# Patient Record
Sex: Female | Born: 1955 | Race: White | Hispanic: No | Marital: Married | Smoking: Never smoker
Health system: Southern US, Community
[De-identification: ages and names within clinical notes are randomized; demographics above are authoritative.]

## PROBLEM LIST (undated history)

## (undated) DIAGNOSIS — T8859XA Other complications of anesthesia, initial encounter: Secondary | ICD-10-CM

## (undated) DIAGNOSIS — Z8739 Personal history of other diseases of the musculoskeletal system and connective tissue: Secondary | ICD-10-CM

## (undated) DIAGNOSIS — Z9889 Other specified postprocedural states: Secondary | ICD-10-CM

## (undated) DIAGNOSIS — R112 Nausea with vomiting, unspecified: Secondary | ICD-10-CM

## (undated) DIAGNOSIS — T7840XA Allergy, unspecified, initial encounter: Secondary | ICD-10-CM

## (undated) DIAGNOSIS — M1990 Unspecified osteoarthritis, unspecified site: Secondary | ICD-10-CM

## (undated) DIAGNOSIS — T4145XA Adverse effect of unspecified anesthetic, initial encounter: Secondary | ICD-10-CM

## (undated) HISTORY — PX: CERVICAL DISCECTOMY: SHX98

## (undated) HISTORY — PX: JOINT REPLACEMENT: SHX530

## (undated) HISTORY — DX: Personal history of other diseases of the musculoskeletal system and connective tissue: Z87.39

## (undated) HISTORY — DX: Unspecified osteoarthritis, unspecified site: M19.90

## (undated) HISTORY — PX: EYE SURGERY: SHX253

## (undated) HISTORY — DX: Allergy, unspecified, initial encounter: T78.40XA

## (undated) HISTORY — PX: KNEE ARTHROSCOPY: SHX127

## (undated) HISTORY — PX: BUNIONECTOMY: SHX129

---

## 1997-09-02 ENCOUNTER — Other Ambulatory Visit: Admission: RE | Admit: 1997-09-02 | Discharge: 1997-09-02 | Payer: Self-pay | Admitting: Gynecology

## 1998-09-27 ENCOUNTER — Other Ambulatory Visit: Admission: RE | Admit: 1998-09-27 | Discharge: 1998-09-27 | Payer: Self-pay | Admitting: Gynecology

## 1998-10-27 ENCOUNTER — Encounter: Admission: RE | Admit: 1998-10-27 | Discharge: 1998-11-22 | Payer: Self-pay | Admitting: Internal Medicine

## 1999-07-26 ENCOUNTER — Encounter: Admission: RE | Admit: 1999-07-26 | Discharge: 1999-07-26 | Payer: Self-pay | Admitting: Diagnostic Radiology

## 1999-07-26 ENCOUNTER — Encounter: Payer: Self-pay | Admitting: Diagnostic Radiology

## 1999-10-21 ENCOUNTER — Other Ambulatory Visit: Admission: RE | Admit: 1999-10-21 | Discharge: 1999-10-21 | Payer: Self-pay | Admitting: Gynecology

## 1999-10-25 ENCOUNTER — Encounter: Admission: RE | Admit: 1999-10-25 | Discharge: 1999-10-25 | Payer: Self-pay | Admitting: Gynecology

## 1999-10-25 ENCOUNTER — Encounter: Payer: Self-pay | Admitting: Gynecology

## 2000-03-14 ENCOUNTER — Encounter: Admission: RE | Admit: 2000-03-14 | Discharge: 2000-03-14 | Payer: Self-pay | Admitting: Rheumatology

## 2000-03-14 ENCOUNTER — Encounter: Payer: Self-pay | Admitting: Rheumatology

## 2000-10-26 ENCOUNTER — Encounter: Payer: Self-pay | Admitting: Gynecology

## 2000-10-26 ENCOUNTER — Encounter: Admission: RE | Admit: 2000-10-26 | Discharge: 2000-10-26 | Payer: Self-pay | Admitting: Gynecology

## 2000-10-31 ENCOUNTER — Other Ambulatory Visit: Admission: RE | Admit: 2000-10-31 | Discharge: 2000-10-31 | Payer: Self-pay | Admitting: Gynecology

## 2001-03-20 ENCOUNTER — Other Ambulatory Visit: Admission: RE | Admit: 2001-03-20 | Discharge: 2001-03-20 | Payer: Self-pay | Admitting: Gynecology

## 2001-03-26 ENCOUNTER — Encounter: Admission: RE | Admit: 2001-03-26 | Discharge: 2001-04-04 | Payer: Self-pay | Admitting: Internal Medicine

## 2001-08-01 ENCOUNTER — Encounter: Admission: RE | Admit: 2001-08-01 | Discharge: 2001-08-01 | Payer: Self-pay | Admitting: Neurosurgery

## 2001-08-01 ENCOUNTER — Ambulatory Visit (HOSPITAL_COMMUNITY): Admission: RE | Admit: 2001-08-01 | Discharge: 2001-08-01 | Payer: Self-pay | Admitting: Neurosurgery

## 2001-08-01 ENCOUNTER — Encounter: Payer: Self-pay | Admitting: Neurosurgery

## 2001-09-05 ENCOUNTER — Encounter: Payer: Self-pay | Admitting: Neurosurgery

## 2001-09-05 ENCOUNTER — Inpatient Hospital Stay (HOSPITAL_COMMUNITY): Admission: RE | Admit: 2001-09-05 | Discharge: 2001-09-06 | Payer: Self-pay | Admitting: Neurosurgery

## 2001-10-28 ENCOUNTER — Encounter: Admission: RE | Admit: 2001-10-28 | Discharge: 2001-10-28 | Payer: Self-pay | Admitting: Gynecology

## 2001-10-28 ENCOUNTER — Encounter: Payer: Self-pay | Admitting: Gynecology

## 2001-10-29 ENCOUNTER — Other Ambulatory Visit: Admission: RE | Admit: 2001-10-29 | Discharge: 2001-10-29 | Payer: Self-pay | Admitting: Gynecology

## 2002-01-22 ENCOUNTER — Encounter: Admission: RE | Admit: 2002-01-22 | Discharge: 2002-02-12 | Payer: Self-pay | Admitting: Neurosurgery

## 2002-10-31 ENCOUNTER — Encounter: Payer: Self-pay | Admitting: Gynecology

## 2002-10-31 ENCOUNTER — Encounter: Admission: RE | Admit: 2002-10-31 | Discharge: 2002-10-31 | Payer: Self-pay | Admitting: Gynecology

## 2002-11-17 ENCOUNTER — Other Ambulatory Visit: Admission: RE | Admit: 2002-11-17 | Discharge: 2002-11-17 | Payer: Self-pay | Admitting: Gynecology

## 2003-11-09 ENCOUNTER — Encounter: Admission: RE | Admit: 2003-11-09 | Discharge: 2003-11-09 | Payer: Self-pay | Admitting: Gynecology

## 2003-11-18 ENCOUNTER — Other Ambulatory Visit: Admission: RE | Admit: 2003-11-18 | Discharge: 2003-11-18 | Payer: Self-pay | Admitting: Gynecology

## 2004-02-13 ENCOUNTER — Encounter: Admission: RE | Admit: 2004-02-13 | Discharge: 2004-02-13 | Payer: Self-pay | Admitting: Radiology

## 2004-11-15 ENCOUNTER — Encounter: Admission: RE | Admit: 2004-11-15 | Discharge: 2004-11-15 | Payer: Self-pay | Admitting: Gynecology

## 2004-11-29 ENCOUNTER — Emergency Department (HOSPITAL_COMMUNITY): Admission: EM | Admit: 2004-11-29 | Discharge: 2004-11-29 | Payer: Self-pay | Admitting: Emergency Medicine

## 2004-11-30 ENCOUNTER — Other Ambulatory Visit: Admission: RE | Admit: 2004-11-30 | Discharge: 2004-11-30 | Payer: Self-pay | Admitting: Gynecology

## 2005-05-30 ENCOUNTER — Ambulatory Visit (HOSPITAL_COMMUNITY): Admission: RE | Admit: 2005-05-30 | Discharge: 2005-05-30 | Payer: Self-pay | Admitting: Diagnostic Radiology

## 2005-05-30 ENCOUNTER — Encounter: Admission: RE | Admit: 2005-05-30 | Discharge: 2005-05-30 | Payer: Self-pay | Admitting: Internal Medicine

## 2005-06-01 ENCOUNTER — Ambulatory Visit (HOSPITAL_COMMUNITY): Admission: RE | Admit: 2005-06-01 | Discharge: 2005-06-01 | Payer: Self-pay | Admitting: Internal Medicine

## 2005-06-01 ENCOUNTER — Encounter: Admission: RE | Admit: 2005-06-01 | Discharge: 2005-06-01 | Payer: Self-pay | Admitting: Internal Medicine

## 2005-11-30 ENCOUNTER — Other Ambulatory Visit: Admission: RE | Admit: 2005-11-30 | Discharge: 2005-11-30 | Payer: Self-pay | Admitting: Gynecology

## 2005-12-13 ENCOUNTER — Encounter: Admission: RE | Admit: 2005-12-13 | Discharge: 2005-12-13 | Payer: Self-pay | Admitting: Gynecology

## 2005-12-15 ENCOUNTER — Encounter: Admission: RE | Admit: 2005-12-15 | Discharge: 2005-12-15 | Payer: Self-pay | Admitting: Gynecology

## 2005-12-24 ENCOUNTER — Encounter: Admission: RE | Admit: 2005-12-24 | Discharge: 2005-12-24 | Payer: Self-pay | Admitting: Internal Medicine

## 2006-12-03 ENCOUNTER — Other Ambulatory Visit: Admission: RE | Admit: 2006-12-03 | Discharge: 2006-12-03 | Payer: Self-pay | Admitting: Gynecology

## 2006-12-17 ENCOUNTER — Encounter: Admission: RE | Admit: 2006-12-17 | Discharge: 2006-12-17 | Payer: Self-pay | Admitting: Gynecology

## 2007-01-02 IMAGING — MG MM SCREEN MAMMOGRAM BILATERAL
4 series · 4 of 4 positions shown · non-contrast
Comparison: 11-09-03

DG SCREEN MAMMOGRAM BILATERAL
Bilateral CC and MLO view(s) were taken.

SCREENING MAMMOGRAM:

[R CC]
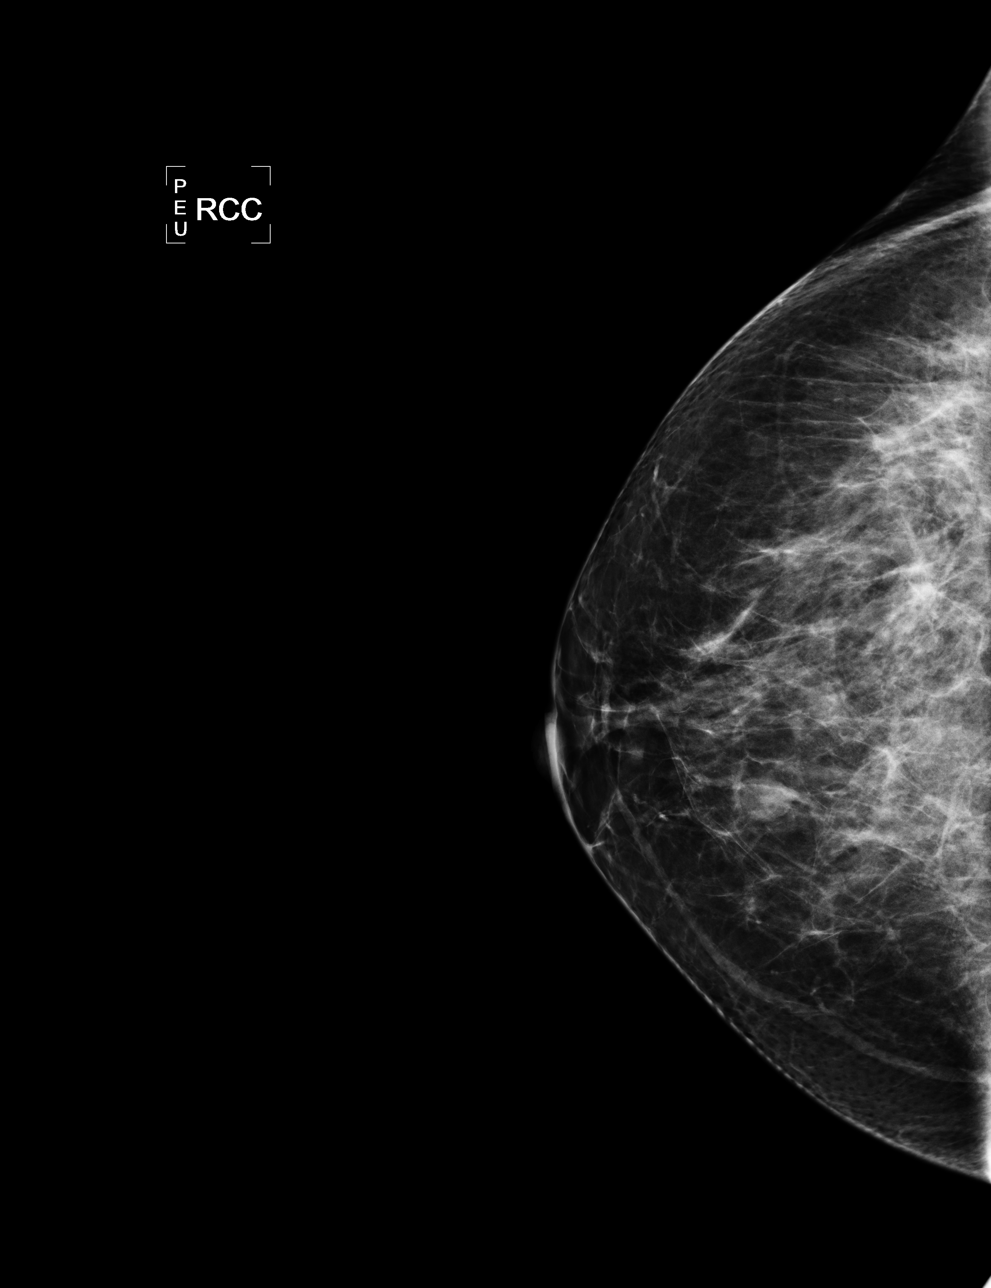

[L CC]
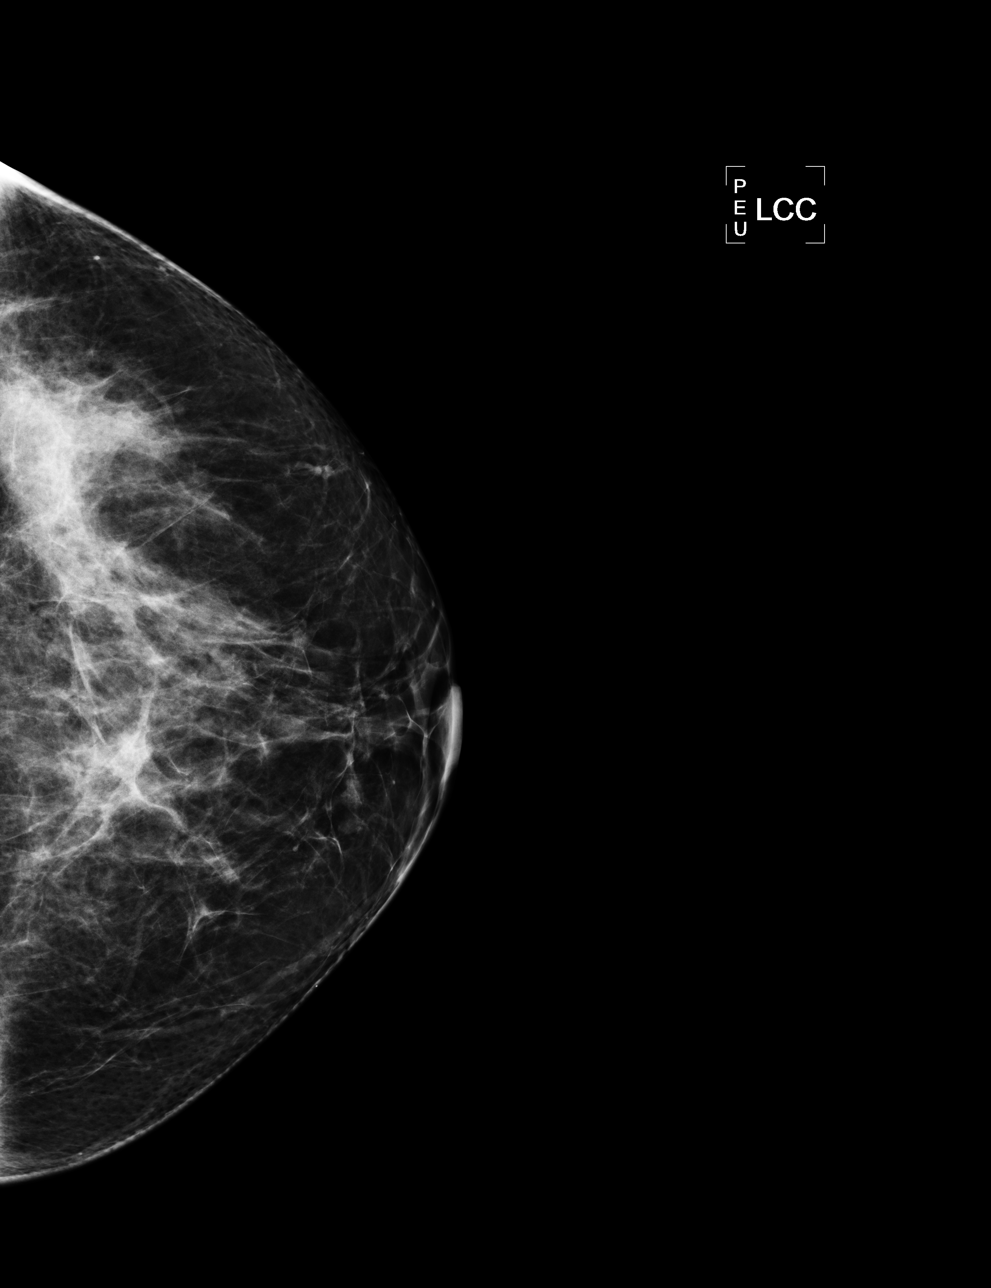

[L MLO]
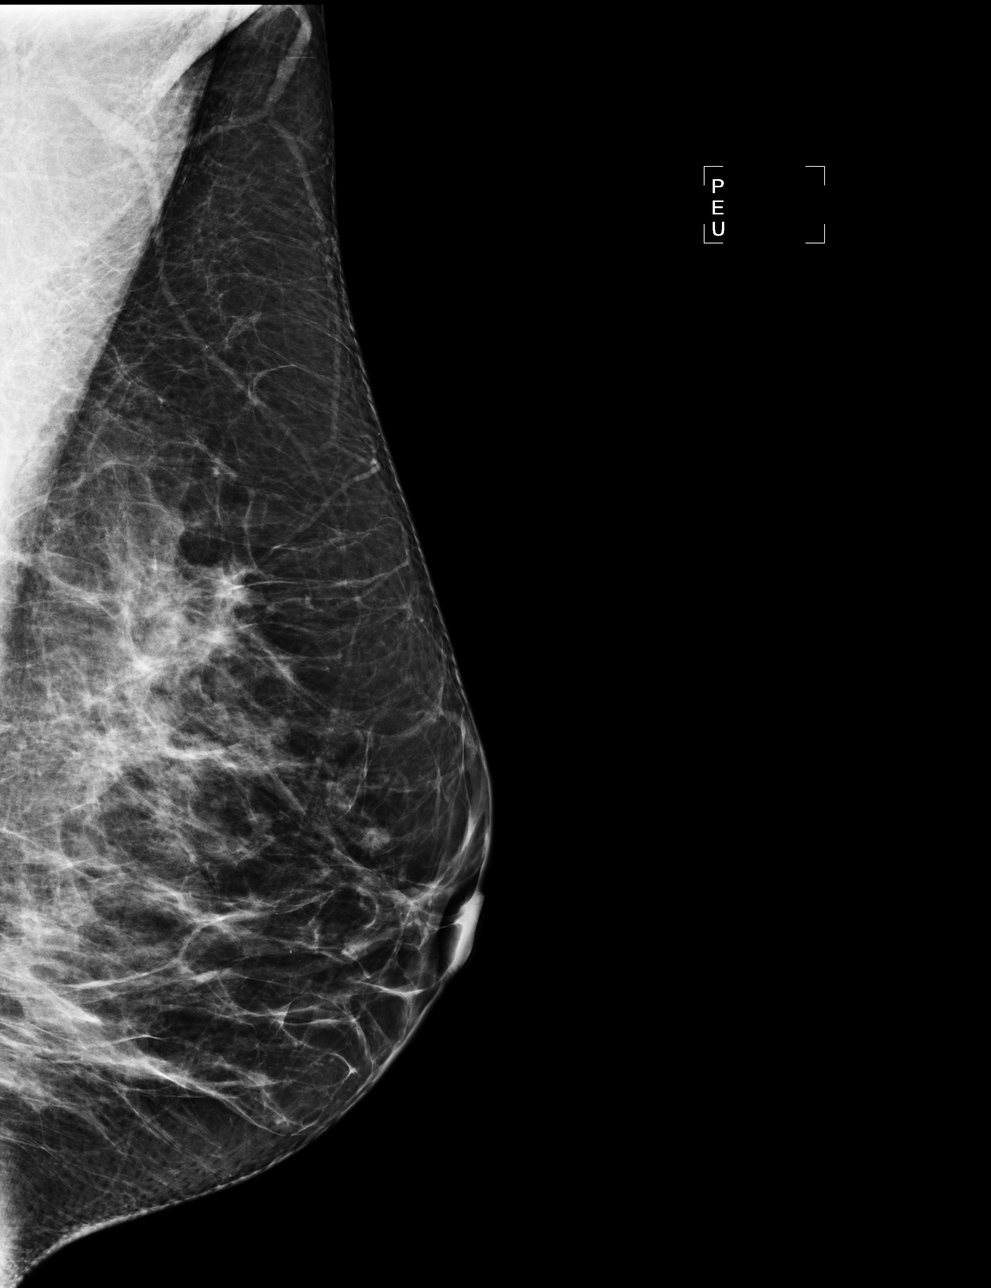

[R MLO]
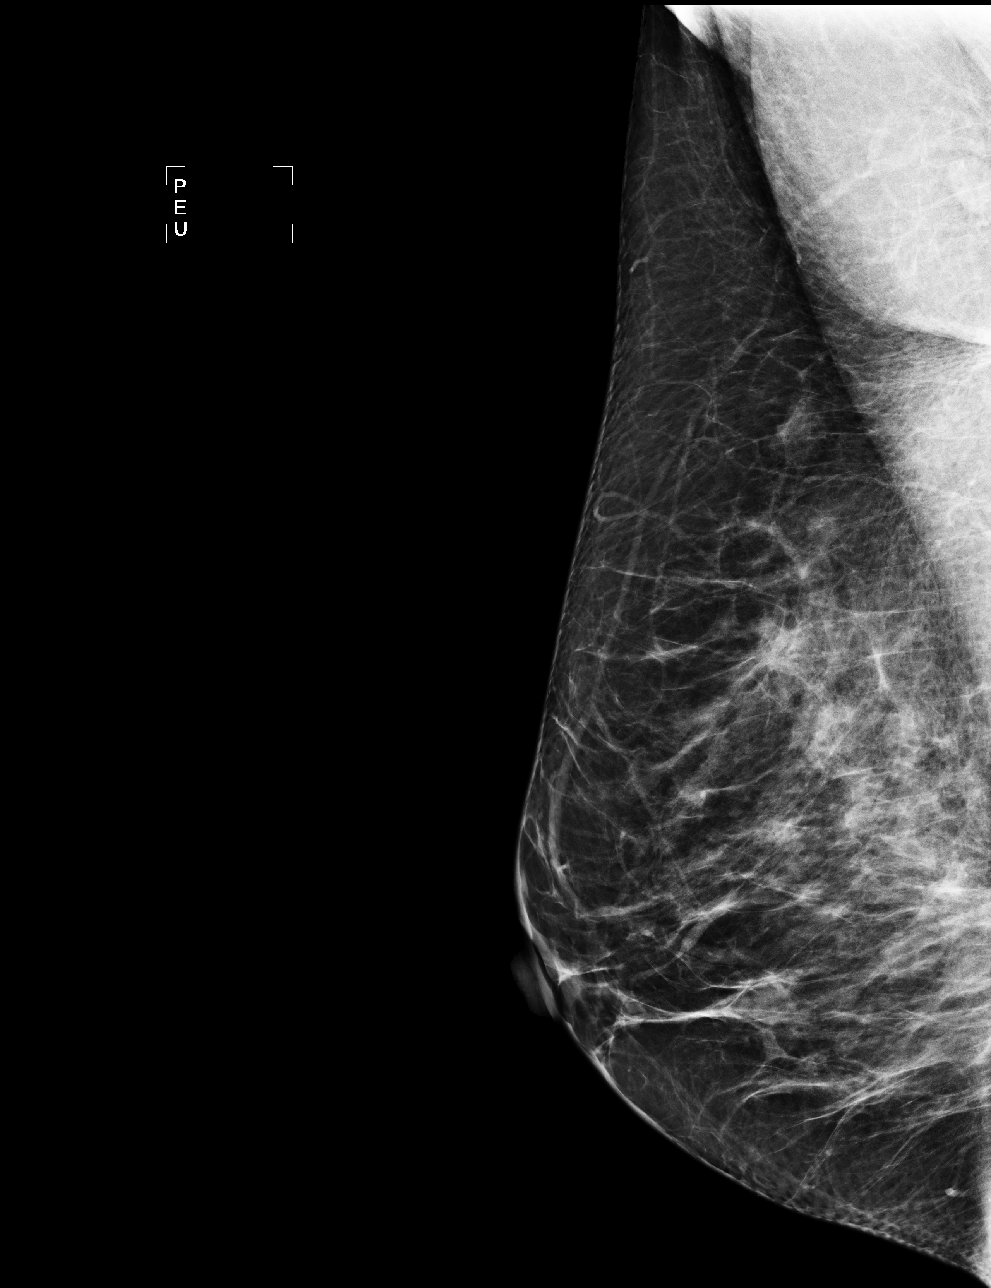

[4 of 4 positions shown; findings below may reference images not displayed]

The breast tissue is heterogeneously dense.  Possible distortion is noted in the left breast.  Spot
compression views and possibly sonography are recommended for further evaluation.  The right 
breast is unremarkable.
IMPRESSION: Possible distortion, left breast.  Additional evaluation is indicated.  The patient will be 
contacted for additional studies and a supplemental report will follow.

ASSESSMENT: Need additional imaging evaluation and/or prior mammograms for comparison - BI-RADS 0 -
Left

Further imaging of the left breast.
ANALYZED BY COMPUTER AIDED DETECTION. , THIS PROCEDURE WAS A DIGITAL MAMMOGRAM.

## 2007-12-09 ENCOUNTER — Other Ambulatory Visit: Admission: RE | Admit: 2007-12-09 | Discharge: 2007-12-09 | Payer: Self-pay | Admitting: Gynecology

## 2007-12-19 ENCOUNTER — Encounter: Admission: RE | Admit: 2007-12-19 | Discharge: 2007-12-19 | Payer: Self-pay | Admitting: Gynecology

## 2008-06-23 ENCOUNTER — Ambulatory Visit: Payer: Self-pay | Admitting: Internal Medicine

## 2008-08-02 ENCOUNTER — Emergency Department (HOSPITAL_COMMUNITY): Admission: EM | Admit: 2008-08-02 | Discharge: 2008-08-02 | Payer: Self-pay | Admitting: Emergency Medicine

## 2008-08-02 ENCOUNTER — Ambulatory Visit: Payer: Self-pay | Admitting: Internal Medicine

## 2008-12-21 ENCOUNTER — Encounter: Admission: RE | Admit: 2008-12-21 | Discharge: 2008-12-21 | Payer: Self-pay | Admitting: Gynecology

## 2008-12-23 ENCOUNTER — Encounter: Admission: RE | Admit: 2008-12-23 | Discharge: 2008-12-23 | Payer: Self-pay | Admitting: Gynecology

## 2009-10-14 ENCOUNTER — Ambulatory Visit: Payer: Self-pay | Admitting: Internal Medicine

## 2009-10-15 ENCOUNTER — Encounter: Admission: RE | Admit: 2009-10-15 | Discharge: 2009-10-15 | Payer: Self-pay | Admitting: Internal Medicine

## 2009-12-27 ENCOUNTER — Encounter: Admission: RE | Admit: 2009-12-27 | Discharge: 2009-12-27 | Payer: Self-pay | Admitting: Gynecology

## 2010-02-17 ENCOUNTER — Ambulatory Visit: Payer: Self-pay | Admitting: Internal Medicine

## 2010-06-25 ENCOUNTER — Encounter: Payer: Self-pay | Admitting: Diagnostic Radiology

## 2010-06-27 ENCOUNTER — Encounter: Payer: Self-pay | Admitting: Gynecology

## 2010-09-20 LAB — COMPREHENSIVE METABOLIC PANEL
ALT: 20 U/L (ref 0–35)
AST: 25 U/L (ref 0–37)
Alkaline Phosphatase: 54 U/L (ref 39–117)
CO2: 22 mEq/L (ref 19–32)
Calcium: 8.6 mg/dL (ref 8.4–10.5)
Chloride: 103 mEq/L (ref 96–112)
GFR calc Af Amer: >60 mL/min (ref >60)
GFR calc non Af Amer: >60 mL/min (ref >60)
Glucose, Bld: 97 mg/dL (ref 70–99)
Potassium: 3.7 mEq/L (ref 3.5–5.1)
Sodium: 134 mEq/L — ABNORMAL LOW (ref 135–145)
Total Bilirubin: 0.5 mg/dL (ref 0.3–1.2)

## 2010-09-20 LAB — CBC
Hemoglobin: 14.3 g/dL (ref 12.0–15.0)
MCHC: 34.6 g/dL (ref 30.0–36.0)
RBC: 4.46 MIL/uL (ref 3.87–5.11)
WBC: 5.9 10*3/uL (ref 4.0–10.5)

## 2010-09-20 LAB — DIFFERENTIAL
Basophils Absolute: 0.1 10*3/uL (ref 0.0–0.1)
Basophils Relative: 1 % (ref 0–1)
Eosinophils Absolute: 0 10*3/uL (ref 0.0–0.7)
Eosinophils Relative: 0 % (ref 0–5)
Lymphs Abs: 1.4 10*3/uL (ref 0.7–4.0)
Neutrophils Relative %: 69 % (ref 43–77)

## 2010-09-20 LAB — MAGNESIUM: Magnesium: 2.3 mg/dL (ref 1.5–2.5)

## 2010-09-28 ENCOUNTER — Other Ambulatory Visit: Payer: Self-pay | Admitting: Gynecology

## 2010-09-28 DIAGNOSIS — Z1231 Encounter for screening mammogram for malignant neoplasm of breast: Secondary | ICD-10-CM

## 2010-10-21 NOTE — Op Note (Signed)
[facility]  Patient:    [name], [name] Visit Number: [id number] MRN: [id number]          Service Type: SUR Location: [id number] Attending Physician:  Barton Fanny Dictated by:   Hewitt Shorts, M.D. Proc. 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  POSTOPERATIVE DIAGNOSIS:  C3-4 cervical spondylosis with associated dynamic instability and resulting neck pain and headache.  OPERATION PERFORMED:  C3-4 anterior cervical diskectomy and arthrodesis with iliac crest allograft and tether cervical plating.  SURGEON:  Hewitt Shorts, M.D.  ASSISTANT:  Payton Doughty, M.D.  ANESTHESIA:  General endotracheal.  INDICATIONS FOR PROCEDURE:  The patient is a 55 year old woman who presented with neck pain and headaches, who is found to have significant left C3-4 facet arthropathy and flexion and extension x-ray showed dynamic instability with proper alignment and extension and 3 to 4 mm anterolisthesis of C3 on 4 in flexion as well as about 2 to 3 mm of anterolisthesis of C3 on 4 in a neutral position.  Decision was made to proceed with single level arthrodesis.  DESCRIPTION OF PROCEDURE:  The patient was brought to the operating room and placed under general endotracheal anesthesia.  The patient was placed in 10 pounds of halter traction and the neck was prepped with Betadine soap and solution and draped in sterile fashion.  A horizontal incision was made on the left side of the neck.  The line of incision was infiltrated with local anesthetic with epinephrine.  Incision was made with a Shaw scalpel at a temperature of 120.  Dissection was carried down to the subcutaneous tissues and platysma.  Dissection was then carried out to an  avascular plane leaving the sternocleidomastoid, carotid artery and jugular vein laterally and trachea and esophagus medially.  The ventral aspect of the vertebral column was identified and localizing x-ray taken and the C3-4 intervertebral disk space was identified.  Diskectomy was begun with incision of the annulus and continued with microcurets and pituitary rongeurs.  The cartilaginous end plates of the corresponding vertebra were removed using microcurets and the Micromax drill.  The microscope was draped and brought into the field to provide additional magnification, illumination and visualization.  Posterior osteophytic changes were removed using the Micromax drill; however, the posterior longitudinal ligament was left intact to allow for ligament taxis with the arthrodesis.  Once the end plates were prepared, we selected a wedge of iliac crest allograft.  It was cut and shaped to size and positioned in the intervertebral disk space and countersunk.  We then selected a 14 mm tether cervical plate.  It was positioned over the fusion construct and secured to each of the vertebrae with a pair of 4.0 x 13 mm screws.  Each screw hole was drilled and tapped and a screw placed.  Final tightening was done of all four screws.  An x-ray showed the graft in good position, the plate and screws in good position, the overall alignment was good.  The cervical traction notably had been discontinued prior to plating.  Once the arthrodesis was completed, we irrigated the wound with bacitracin solution and checked for hemostasis which was established and confirmed and then  proceeded with closure.  The platysma was closed with interrupted inverted 2-0 undyed Vicryl sutures.  The subcutaneous and subcuticular layer were closed with interrupted inverted 3-0 undyed Vicryl sutures and the skin was reapproximated with Dermabond.  The patient tolerated the procedure well.  Estimated blood loss for this  procedure was 25 cc.  Sponge, needle and instrument counts were correct.  The patient was placed in a soft cervical collar, reversed from anesthetic, extubated and transferred to the recovery room for further care where she was noted to be moving all four extremities. Dictated by:   Hewitt Shorts, M.D. Attending Physician:  Barton Fanny DD:  09/05/01 TD:  09/05/01 Job: [id number]

## 2010-12-29 ENCOUNTER — Ambulatory Visit
Admission: RE | Admit: 2010-12-29 | Discharge: 2010-12-29 | Disposition: A | Discharge status: Home / Self Care | Payer: PRIVATE HEALTH INSURANCE | Source: Ambulatory Visit | Attending: Gynecology | Admitting: Gynecology

## 2010-12-29 DIAGNOSIS — Z1231 Encounter for screening mammogram for malignant neoplasm of breast: Secondary | ICD-10-CM

## 2011-01-18 ENCOUNTER — Encounter: Payer: Self-pay | Admitting: Internal Medicine

## 2011-01-20 ENCOUNTER — Other Ambulatory Visit: Payer: PRIVATE HEALTH INSURANCE | Admitting: Internal Medicine

## 2011-01-20 DIAGNOSIS — Z0000 Encounter for general adult medical examination without abnormal findings: Secondary | ICD-10-CM

## 2011-01-21 LAB — COMPREHENSIVE METABOLIC PANEL
Albumin: 4.1 g/dL (ref 3.5–5.2)
BUN: 16 mg/dL (ref 6–23)
CO2: 22 mEq/L (ref 19–32)
Glucose, Bld: 86 mg/dL (ref 70–99)
Potassium: 4 mEq/L (ref 3.5–5.3)
Sodium: 138 mEq/L (ref 135–145)
Total Bilirubin: 0.7 mg/dL (ref 0.3–1.2)
Total Protein: 6.5 g/dL (ref 6.0–8.3)

## 2011-01-21 LAB — CBC WITH DIFFERENTIAL/PLATELET
Eosinophils Absolute: 0.2 10*3/uL (ref 0.0–0.7)
HCT: 40.1 % (ref 36.0–46.0)
Hemoglobin: 12.8 g/dL (ref 12.0–15.0)
Lymphs Abs: 1.9 10*3/uL (ref 0.7–4.0)
MCH: 30.2 pg (ref 26.0–34.0)
Monocytes Absolute: 0.4 10*3/uL (ref 0.1–1.0)
Monocytes Relative: 10 % (ref 3–12)
Neutrophils Relative %: 31 % — ABNORMAL LOW (ref 43–77)
RBC: 4.24 MIL/uL (ref 3.87–5.11)

## 2011-01-21 LAB — LIPID PANEL
Cholesterol: 209 mg/dL — ABNORMAL HIGH (ref 0–200)
Triglycerides: 40 mg/dL (ref <150)

## 2011-01-22 ENCOUNTER — Encounter: Payer: Self-pay | Admitting: Internal Medicine

## 2011-01-23 ENCOUNTER — Ambulatory Visit (INDEPENDENT_AMBULATORY_CARE_PROVIDER_SITE_OTHER): Payer: PRIVATE HEALTH INSURANCE | Admitting: Internal Medicine

## 2011-01-23 ENCOUNTER — Encounter: Payer: Self-pay | Admitting: Internal Medicine

## 2011-01-23 DIAGNOSIS — Z8669 Personal history of other diseases of the nervous system and sense organs: Secondary | ICD-10-CM

## 2011-01-23 DIAGNOSIS — J309 Allergic rhinitis, unspecified: Secondary | ICD-10-CM

## 2011-01-23 DIAGNOSIS — M1120 Other chondrocalcinosis, unspecified site: Secondary | ICD-10-CM

## 2011-01-23 DIAGNOSIS — Z0000 Encounter for general adult medical examination without abnormal findings: Secondary | ICD-10-CM

## 2011-01-23 DIAGNOSIS — M5090 Cervical disc disorder, unspecified, unspecified cervical region: Secondary | ICD-10-CM

## 2011-01-23 DIAGNOSIS — M1990 Unspecified osteoarthritis, unspecified site: Secondary | ICD-10-CM

## 2011-01-23 LAB — POCT URINALYSIS DIPSTICK
Bilirubin, UA: NEGATIVE
Glucose, UA: NEGATIVE
Ketones, UA: NEGATIVE
Leukocytes, UA: NEGATIVE

## 2011-02-03 ENCOUNTER — Encounter: Payer: Self-pay | Admitting: Internal Medicine

## 2011-02-03 NOTE — Progress Notes (Signed)
  Subjective:    Patient ID: [name], female    DOB: [birth year], 55 y.o.   MRN: [id number]  HPI pleasant 55 year old white female scheduled to undergo bilateral knee replacement surgery October 2012. History of migraine headaches, allergic rhinitis, osteoarthritis in knees and feet. 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 Has had cortisone and Visco injections into the knee joints. Cannot walk much due to knee discomfort. No history of fibromyalgia.  Had colonoscopy 2009. Patient had one small sessile polyp. Repeat study recommended in 2014. Noted to have external and internal hemorrhoids. History of neck pain treated with physical therapy 2002. Subsequently had C3-C4 anterior cervical discectomy and arthrodesis with iliac crest allograft and Tether cervical plating by Dr. Newell Coral in 2003. Presented to the emergency department February 2010 with severe migraine headache for several days. CT of the brain was negative. Patient was treated with IV fluids and IV Solu-Medrol.    Review of Systems  Constitutional: Negative.   HENT: Negative.   Eyes: Negative.   Respiratory: Negative.   Cardiovascular: Negative.        History of chest wall pain  Gastrointestinal: Negative.   Genitourinary: Negative.   Musculoskeletal: Positive for joint swelling and arthralgias.  Neurological:       History of vertigo 2010. Migraine headaches  Hematological: Negative.   Psychiatric/Behavioral: Negative.        Objective:   Physical Exam  Constitutional: She is oriented to person, place, and time. She appears well-nourished. No distress.  HENT:  Head: Normocephalic.  Right Ear: External ear normal.  Left Ear: External ear normal.  Mouth/Throat: Oropharynx is clear and moist.  Eyes: Conjunctivae and EOM are normal. Pupils are equal, round, and reactive to light.  Neck: Neck supple. No JVD present. No thyromegaly present.  Cardiovascular: Normal rate, regular rhythm, normal  heart sounds and intact distal pulses.   No murmur heard. Pulmonary/Chest: Effort normal and breath sounds normal. She has no rales.  Abdominal: Soft. Bowel sounds are normal. She exhibits no distension and no mass. There is no tenderness. There is no rebound.  Musculoskeletal: She exhibits no edema.  Lymphadenopathy:    She has no cervical adenopathy.  Neurological: She is alert and oriented to person, place, and time. She has normal reflexes. No cranial nerve deficit. Coordination normal.  Skin: Skin is warm and dry. No rash noted.  Psychiatric: She has a normal mood and affect. Her behavior is normal.          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

## 2011-02-04 DIAGNOSIS — Z8669 Personal history of other diseases of the nervous system and sense organs: Secondary | ICD-10-CM | POA: Insufficient documentation

## 2011-02-04 DIAGNOSIS — M5090 Cervical disc disorder, unspecified, unspecified cervical region: Secondary | ICD-10-CM | POA: Insufficient documentation

## 2011-02-04 DIAGNOSIS — M1990 Unspecified osteoarthritis, unspecified site: Secondary | ICD-10-CM | POA: Insufficient documentation

## 2011-02-04 DIAGNOSIS — J309 Allergic rhinitis, unspecified: Secondary | ICD-10-CM | POA: Insufficient documentation

## 2011-02-08 ENCOUNTER — Encounter: Payer: Self-pay | Admitting: Internal Medicine

## 2011-03-20 ENCOUNTER — Other Ambulatory Visit: Payer: Self-pay | Admitting: Orthopedic Surgery

## 2011-03-20 ENCOUNTER — Encounter (HOSPITAL_COMMUNITY): Payer: PRIVATE HEALTH INSURANCE

## 2011-03-20 LAB — COMPREHENSIVE METABOLIC PANEL
ALT: 17 U/L (ref 0–35)
AST: 23 U/L (ref 0–37)
CO2: 28 mEq/L (ref 19–32)
Calcium: 9.8 mg/dL (ref 8.4–10.5)
Chloride: 101 mEq/L (ref 96–112)
Creatinine, Ser: 0.75 mg/dL (ref 0.50–1.10)
GFR calc Af Amer: >90 mL/min (ref >90)
GFR calc non Af Amer: >90 mL/min (ref >90)
Glucose, Bld: 90 mg/dL (ref 70–99)
Sodium: 137 mEq/L (ref 135–145)
Total Bilirubin: 0.2 mg/dL — ABNORMAL LOW (ref 0.3–1.2)

## 2011-03-20 LAB — URINALYSIS, ROUTINE W REFLEX MICROSCOPIC
Ketones, ur: NEGATIVE mg/dL
Leukocytes, UA: NEGATIVE
Nitrite: NEGATIVE
Specific Gravity, Urine: 1.018 (ref 1.005–1.030)
pH: 7 (ref 5.0–8.0)

## 2011-03-20 LAB — PROTIME-INR
INR: 0.96 (ref 0.00–1.49)
Prothrombin Time: 13 seconds (ref 11.6–15.2)

## 2011-03-20 LAB — CBC
Hemoglobin: 13.6 g/dL (ref 12.0–15.0)
MCHC: 33.5 g/dL (ref 30.0–36.0)
RBC: 4.45 MIL/uL (ref 3.87–5.11)

## 2011-03-20 LAB — ABO/RH: ABO/RH(D): O POS

## 2011-03-22 ENCOUNTER — Inpatient Hospital Stay (HOSPITAL_COMMUNITY)
Admission: RE | Admit: 2011-03-22 | Discharge: 2011-03-27 | DRG: 462 | Disposition: A | Discharge status: Rehabilitation Facility | Payer: PRIVATE HEALTH INSURANCE | Source: Ambulatory Visit | Attending: Orthopedic Surgery | Admitting: Orthopedic Surgery

## 2011-03-22 DIAGNOSIS — I959 Hypotension, unspecified: Secondary | ICD-10-CM | POA: Diagnosis not present

## 2011-03-22 DIAGNOSIS — Z7982 Long term (current) use of aspirin: Secondary | ICD-10-CM

## 2011-03-22 DIAGNOSIS — M1710 Unilateral primary osteoarthritis, unspecified knee: Principal | ICD-10-CM | POA: Diagnosis present

## 2011-03-22 DIAGNOSIS — Z87442 Personal history of urinary calculi: Secondary | ICD-10-CM

## 2011-03-22 DIAGNOSIS — Z780 Asymptomatic menopausal state: Secondary | ICD-10-CM

## 2011-03-22 DIAGNOSIS — E871 Hypo-osmolality and hyponatremia: Secondary | ICD-10-CM | POA: Diagnosis not present

## 2011-03-22 DIAGNOSIS — D62 Acute posthemorrhagic anemia: Secondary | ICD-10-CM | POA: Diagnosis not present

## 2011-03-22 DIAGNOSIS — G43909 Migraine, unspecified, not intractable, without status migrainosus: Secondary | ICD-10-CM | POA: Diagnosis present

## 2011-03-22 DIAGNOSIS — Z01812 Encounter for preprocedural laboratory examination: Secondary | ICD-10-CM

## 2011-03-22 DIAGNOSIS — M1120 Other chondrocalcinosis, unspecified site: Secondary | ICD-10-CM | POA: Diagnosis present

## 2011-03-22 DIAGNOSIS — Z79899 Other long term (current) drug therapy: Secondary | ICD-10-CM

## 2011-03-23 DIAGNOSIS — Z96659 Presence of unspecified artificial knee joint: Secondary | ICD-10-CM

## 2011-03-23 DIAGNOSIS — M1710 Unilateral primary osteoarthritis, unspecified knee: Secondary | ICD-10-CM

## 2011-03-23 LAB — CBC
Hemoglobin: 10 g/dL — ABNORMAL LOW (ref 12.0–15.0)
Platelets: 212 10*3/uL (ref 150–400)
RBC: 3.23 MIL/uL — ABNORMAL LOW (ref 3.87–5.11)
WBC: 7 10*3/uL (ref 4.0–10.5)

## 2011-03-23 LAB — MRSA CULTURE

## 2011-03-23 LAB — BASIC METABOLIC PANEL
CO2: 25 mEq/L (ref 19–32)
Calcium: 8.3 mg/dL — ABNORMAL LOW (ref 8.4–10.5)
Chloride: 105 mEq/L (ref 96–112)
Glucose, Bld: 161 mg/dL — ABNORMAL HIGH (ref 70–99)
Potassium: 4 mEq/L (ref 3.5–5.1)
Sodium: 135 mEq/L (ref 135–145)

## 2011-03-23 LAB — PROTIME-INR: Prothrombin Time: 14.9 seconds (ref 11.6–15.2)

## 2011-03-24 LAB — BASIC METABOLIC PANEL
Chloride: 106 mEq/L (ref 96–112)
Creatinine, Ser: 0.63 mg/dL (ref 0.50–1.10)
GFR calc Af Amer: >90 mL/min (ref >90)
Sodium: 135 mEq/L (ref 135–145)

## 2011-03-24 LAB — CBC
MCV: 91 fL (ref 78.0–100.0)
Platelets: 193 10*3/uL (ref 150–400)
RDW: 13.3 % (ref 11.5–15.5)
WBC: 7.4 10*3/uL (ref 4.0–10.5)

## 2011-03-24 LAB — PROTIME-INR
INR: 1.19 (ref 0.00–1.49)
Prothrombin Time: 15.4 seconds — ABNORMAL HIGH (ref 11.6–15.2)

## 2011-03-25 LAB — PROTIME-INR
INR: 1.22 (ref 0.00–1.49)
Prothrombin Time: 15.7 seconds — ABNORMAL HIGH (ref 11.6–15.2)

## 2011-03-25 LAB — BASIC METABOLIC PANEL
BUN: 6 mg/dL (ref 6–23)
Creatinine, Ser: 0.64 mg/dL (ref 0.50–1.10)
GFR calc Af Amer: >90 mL/min (ref >90)
GFR calc non Af Amer: >90 mL/min (ref >90)
Potassium: 3.8 mEq/L (ref 3.5–5.1)

## 2011-03-25 LAB — CBC
MCH: 30.1 pg (ref 26.0–34.0)
MCHC: 33 g/dL (ref 30.0–36.0)
Platelets: 196 10*3/uL (ref 150–400)
RBC: 2.92 MIL/uL — ABNORMAL LOW (ref 3.87–5.11)

## 2011-03-26 LAB — BASIC METABOLIC PANEL
GFR calc Af Amer: >90 mL/min (ref >90)
GFR calc non Af Amer: >90 mL/min (ref >90)
Potassium: 3.8 mEq/L (ref 3.5–5.1)
Sodium: 136 mEq/L (ref 135–145)

## 2011-03-26 LAB — CBC
MCHC: 34.2 g/dL (ref 30.0–36.0)
RDW: 13.4 % (ref 11.5–15.5)

## 2011-03-26 LAB — PROTIME-INR
INR: 1.68 — ABNORMAL HIGH (ref 0.00–1.49)
Prothrombin Time: 20.1 seconds — ABNORMAL HIGH (ref 11.6–15.2)

## 2011-03-27 ENCOUNTER — Inpatient Hospital Stay (HOSPITAL_COMMUNITY)
Admission: RE | Admit: 2011-03-27 | Discharge: 2011-04-05 | DRG: 945 | Disposition: A | Discharge status: Home Health | Payer: PRIVATE HEALTH INSURANCE | Source: Other Acute Inpatient Hospital | Attending: Physical Medicine & Rehabilitation | Admitting: Physical Medicine & Rehabilitation

## 2011-03-27 DIAGNOSIS — E785 Hyperlipidemia, unspecified: Secondary | ICD-10-CM

## 2011-03-27 DIAGNOSIS — Z5189 Encounter for other specified aftercare: Principal | ICD-10-CM

## 2011-03-27 DIAGNOSIS — M1710 Unilateral primary osteoarthritis, unspecified knee: Secondary | ICD-10-CM

## 2011-03-27 DIAGNOSIS — G4700 Insomnia, unspecified: Secondary | ICD-10-CM

## 2011-03-27 DIAGNOSIS — Z981 Arthrodesis status: Secondary | ICD-10-CM

## 2011-03-27 DIAGNOSIS — D62 Acute posthemorrhagic anemia: Secondary | ICD-10-CM

## 2011-03-27 DIAGNOSIS — Z7901 Long term (current) use of anticoagulants: Secondary | ICD-10-CM

## 2011-03-27 DIAGNOSIS — K5900 Constipation, unspecified: Secondary | ICD-10-CM

## 2011-03-27 DIAGNOSIS — Z96659 Presence of unspecified artificial knee joint: Secondary | ICD-10-CM

## 2011-03-27 DIAGNOSIS — G43909 Migraine, unspecified, not intractable, without status migrainosus: Secondary | ICD-10-CM

## 2011-03-27 DIAGNOSIS — L259 Unspecified contact dermatitis, unspecified cause: Secondary | ICD-10-CM

## 2011-03-27 LAB — CBC
HCT: 22.2 % — ABNORMAL LOW (ref 36.0–46.0)
Hemoglobin: 7.7 g/dL — ABNORMAL LOW (ref 12.0–15.0)
MCH: 31.2 pg (ref 26.0–34.0)
MCHC: 34.7 g/dL (ref 30.0–36.0)
RBC: 2.47 MIL/uL — ABNORMAL LOW (ref 3.87–5.11)

## 2011-03-27 LAB — URINALYSIS, MICROSCOPIC ONLY
Bilirubin Urine: NEGATIVE
Nitrite: NEGATIVE
Specific Gravity, Urine: 1.006 (ref 1.005–1.030)
Urobilinogen, UA: 0.2 mg/dL (ref 0.0–1.0)
pH: 7.5 (ref 5.0–8.0)

## 2011-03-27 LAB — PROTIME-INR
INR: 1.66 — ABNORMAL HIGH (ref 0.00–1.49)
Prothrombin Time: 19.9 seconds — ABNORMAL HIGH (ref 11.6–15.2)

## 2011-03-28 DIAGNOSIS — M1710 Unilateral primary osteoarthritis, unspecified knee: Secondary | ICD-10-CM

## 2011-03-28 DIAGNOSIS — Z96659 Presence of unspecified artificial knee joint: Secondary | ICD-10-CM

## 2011-03-28 LAB — COMPREHENSIVE METABOLIC PANEL
ALT: 20 U/L (ref 0–35)
Albumin: 2.7 g/dL — ABNORMAL LOW (ref 3.5–5.2)
Calcium: 9.1 mg/dL (ref 8.4–10.5)
GFR calc Af Amer: >90 mL/min (ref >90)
Glucose, Bld: 112 mg/dL — ABNORMAL HIGH (ref 70–99)
Potassium: 3.8 mEq/L (ref 3.5–5.1)
Sodium: 136 mEq/L (ref 135–145)
Total Protein: 6.4 g/dL (ref 6.0–8.3)

## 2011-03-28 LAB — CBC
MCV: 88.8 fL (ref 78.0–100.0)
Platelets: 329 10*3/uL (ref 150–400)
RBC: 2.85 MIL/uL — ABNORMAL LOW (ref 3.87–5.11)
RDW: 13.2 % (ref 11.5–15.5)
WBC: 6 10*3/uL (ref 4.0–10.5)

## 2011-03-28 LAB — PROTIME-INR
INR: 1.55 — ABNORMAL HIGH (ref 0.00–1.49)
Prothrombin Time: 18.9 seconds — ABNORMAL HIGH (ref 11.6–15.2)

## 2011-03-28 LAB — URINE CULTURE
Colony Count: NO GROWTH
Culture  Setup Time: 201210221908
Culture: NO GROWTH

## 2011-03-28 LAB — DIFFERENTIAL
Basophils Absolute: 0.1 10*3/uL (ref 0.0–0.1)
Basophils Relative: 1 % (ref 0–1)
Eosinophils Absolute: 0.4 10*3/uL (ref 0.0–0.7)
Eosinophils Relative: 6 % — ABNORMAL HIGH (ref 0–5)
Neutrophils Relative %: 44 % (ref 43–77)

## 2011-03-29 LAB — PROTIME-INR: Prothrombin Time: 24.9 seconds — ABNORMAL HIGH (ref 11.6–15.2)

## 2011-03-29 NOTE — Discharge Summary (Addendum)
[name], [name]             ACCOUNT NO.:  [id number]  MEDICAL RECORD NO.:  [id number]  LOCATION:  1614                         FACILITY:  [facility]  PHYSICIAN:  Ollen Gross, M.D.    DATE OF BIRTH:  1955/06/10  DATE OF ADMISSION:  03/22/2011 DATE OF DISCHARGE:  03/27/2011                        DISCHARGE SUMMARY - REFERRING   ADMITTING DIAGNOSES: 1. Osteoarthritis, bilateral knees. 2. Migraines. 3. Renal calculi. 4. Calcium pyrophosphate dihydrate deposition. 5. Postmenopausal.  DISCHARGE DIAGNOSES: 1. Osteoarthritis, bilateral knees status post bilateral total knee     replacement arthroplasty. 2. Postoperative acute blood loss anemia did not require transfusion. 3. Postoperative hyponatremia, improved. 4. Migraines. 5. Renal calculi. 6. Calcium pyrophosphate dihydrate deposition. 7. Postmenopausal.  PROCEDURE:  March 22, 2011, bilateral total knee replacement arthroplasty.  SURGEON:  Ollen Gross, M.D.  ASSISTANT:  Alexzandrew L. Perkins, P.A.C.  ANESTHESIA:  General with epidural placement postop.  TOURNIQUET TIME:  On the right knee, 35.  Tourniquet time on the left knee, 35.  CONSULTS: 1. [facility]. 2. [facility]  BRIEF HISTORY:  The patient is a 55 year old female with advanced bone- on-bone arthritis in the patellofemoral compartments, intractable pain, worsening dysfunction.  Despite extensive nonoperative management including injections and analgesics, she had injections of cortisone and viscosupplementation.  They are no longer working, now presents for total knee replacements.  LABORATORY DATA:  Preop CBC showed hemoglobin 13.6, hematocrit 40.6, white cell count 4.4, platelets 318.  PT/INR preop 13.0 and 0.96 with PTT of 31.  Chem panel on admission, slightly low albumin at 3.2, slightly low total bili of 0.2.  Remaining chem panel all within normal limits.  Preop UA negative.  Blood group type O positive.   Nasal swabs were negative for staph aureus, negative for MRSA.  Serial CBCs were followed.  Hemoglobin dropped down to 10 on day 1, then 8.8 where stabilized for a couple of days, drift down a little bit further to 8.2, last noted H and H was 7.7 and 22.2.  She was asymptomatic with this anemia.  Serial protimes followed per Coumadin protocol.  Last PT at the time of transfer 19.9 and 1.66.  BMETs were followed for 4 days.  Sodium did drop from 137 to 134, back up to 136; glucose went up from 90 to 161, back down to 107; remaining of the BMETs all within normal limits.  HOSPITAL COURSE:  The patient admitted to [facility], taken to OR, underwent above-stated procedure without complication.  The patient tolerated procedure well, later transferred to recovery room in orthopedic floor.  She did have epidural placed at time of surgery.  She was doing pretty well in the morning of day #1.  She had a little bit of hypotension through the night and into the morning of day #1, felt to be related to the epidural, the rates were adjusted per anesthesia.  The epidural was adjusted for her symptoms including the hypotension.  She also received fluids.  She was started on Coumadin on the evening of postop day #1.  She was doing fairly well.  The left leg was a little more numb and the right leg was a little more painful and  felt to be just due to positioning with the epidural catheter.  She had excellent urinary output.  Hemoglobin looked good.  We did order [facility] consult.  She wanted to look into going over to [facility].  __________ later on that evening, the epidural was being adjusted by Anesthesia, unable to flush and it occluded and for questionable whether it clotted up, so the epidural had to be discontinued.  We increased the PCA up to full dose and encouraged p.o. pills.  She had an increase in her pain after the epidural came out.  Despite having that, she got up and  walked a few feet on day #1 and up to about 10-15 feet on day #2.  Once the epidural was out, we started Lovenox 12 hours later just for coverage, so she was on Lovenox and Coumadin once the epidural had been out.  She is still using the PCA on day #2, so we continued that.  We put her on iron supplement for the anemia, she is down to 8.8 on day #3.  She continued to slowly progress with physical therapy in each day.  She was doing a little bit better and pain was under better control by day #3 over the weekend.  She had some intermittent nausea on day #2 and that was better by day #3.  Hemoglobin was stable.  Sodium was down a little bit, felt to be a dilutional component.  We were just going to allow her to concentrate backup.  She remained in the hospital through the weekend on Saturday and Sunday, continued to receive therapy.  By Sunday, she was doing much better, both the dressings were changed on day #2 and every day after that.  Both incisions were healing well.  No signs of infection.  She was seen back on rounds on Monday postop day #5.  She was doing well in good spirits, progressing with the therapy.  It is felt that the bed would be available today over [facility] and was decided the patient to be transferred over that time.  DISCHARGE PLANS: 1. The patient will be transferred over to [facility] today. 2. Discharge diagnoses, please see above.  DISCHARGE MEDICATIONS:  Current medications at time of transfer include: 1. Coumadin protocol.  Please titrate the Coumadin level for target     INR between 2.0 and 3.0.  She is to be on Coumadin for 4 weeks     being a bilateral total knee arthroplasty. 2. Lovenox 30 mg subcu injection every 12 hours.  Continue the Lovenox     until the INR is therapeutic at 2.0 or greater, then discontinue     the Lovenox. 3. Colace 100 mg p.o. b.i.d. 4. Elavil 10 mg p.o. at bedtime. 5. Nu-Iron 150 mg p.o. b.i.d. for 3 weeks  and discontinue the Nu-Iron. 6. Robaxin 500 mg p.o. q.6-8 hours p.r.n. spasm. 7. Restoril 15 mg 1-2 tabs at bedtime p.r.n. sleep. 8. Zofran 4 mg p.o. q.6 hours p.r.n. nausea. 9. Tylenol 325 1 or 2 every 4-6 hours as needed for mild pain,     temperature, or headache. 10.Zomig 5 mg 1/4 up to 1 tab daily as needed for migraines. 11.OxyIR 5 mg 1 to 2 to 3 tabs every 4 hours as needed for moderate to     severe pain.  DIET:  As tolerated.  ACTIVITY:  She is weightbearing as tolerated, total knee protocol. Please allow her to use a CPM machine,  do not exceed more than 2 hours per session.  She may alternate, do not use the knee machines on both legs at the same time.  Alternate the machines.  Continue with PT and OT for gait training, ambulation, ADLs, range of motion strengthening exercises.  FOLLOWUP:  She is to follow up with Dr. Lequita Halt in the office approximately 2 weeks from date of surgery or following the discharge from her [facility].  DISPOSITION:  [facility].  CONDITION UPON DISCHARGE:  Improving at time of dictation.     Alexzandrew L. Julien Girt, P.A.C.   ______________________________ Ollen Gross, M.D.    ALP/MEDQ  D:  03/27/2011  T:  03/27/2011  Job:  [id number] cc:   [facility]  Luanna Cole. Lenord Fellers, M.D. Fax: [phone]  Electronically Signed by Patrica Duel P.A.C. on 03/27/2011 28:41:32 PM Electronically Signed by Ollen Gross M.D. on 03/29/2011 11:30:21 AM Electronically Signed by Ollen Gross M.D. on 03/29/2011 11:33:49 AM

## 2011-03-29 NOTE — H&P (Addendum)
NAMECARMELITE, [name]             ACCOUNT NO.:  [id number]  MEDICAL RECORD NO.:  [id number]  LOCATION:                               FACILITY:  [facility]  PHYSICIAN:  Ollen Gross, M.D.    DATE OF BIRTH:  [birth year]  DATE OF ADMISSION:  03/22/2011 DATE OF DISCHARGE:                             HISTORY & PHYSICAL   CHIEF COMPLAINT:  Bilateral knee pain.  HISTORY OF PRESENT ILLNESS:  Patient is a 55 year old female, who has been seen by Dr. Lequita Halt for ongoing bilateral knee pain.  She has known significant arthritis in both knees.  She has been seen and has had courses of injections in the past, which have only provided temporary relief.  She has also undergone viscous supplementation injections for several years.  She has had unloader braces and has tried different medications.  Unfortunately, over time her knees continued to get worse. She is at a point now where the knees have progressed and interfering with her daily function, is felt she would benefit undergoing surgical intervention.  Risks and benefits have been discussed, she elected to proceed with surgery.  There are no contraindications of the upcoming procedure such as ongoing infection or neurological progressive disease.  ALLERGIES:  No known drug allergies.  CURRENT MEDICATIONS:  Lovaza, CombiPatch hormone, Celebrex, amitriptyline, Zomig, Lidoderm patches, melatonin, aspirin, multivitamin.  PAST MEDICAL HISTORY: 1. Migraines. 2. Renal calculi. 3. CPPD. 4. Postmenopausal.  PAST SURGICAL HISTORY:  Cervical spine fusion, bunion.  FAMILY HISTORY:  Father deceased at age 72 with aplastic anemia and mother deceased at age 52 with a benign brain tumor.  Sister deceased with a history of sarcoma.  SOCIAL HISTORY:  Married.  Homemaker.  Nonsmoker, 2 drinks per week, maximum.  She has a dozen steps entering her home.  She does want to look into [facility], postoperatively.  REVIEW OF SYSTEMS:   GENERAL:  No fever or chills.  She does have night sweats, which she attributes to menopause.  HEENT:  Occasional headaches.  No seizures, syncope, or paralysis.  RESPIRATORY:  No shortness breath, productive cough, or hemoptysis.  CARDIOVASCULAR:  No chest pain or orthopnea.  GI:  No nausea, vomiting, diarrhea, or constipation.  GU:  No dysuria, hematuria, or discharge. MUSCULOSKELETAL:  Knee pain.  PHYSICAL EXAMINATION:  VITAL SIGNS:  Pulse 68, respirations 12, blood pressure 132/82. GENERAL:  A 55 year old white female, well nourished, well developed in no acute distress.  She is alert, oriented, and cooperative, pleasant, excellent historian. HEENT:  Normocephalic, atraumatic.  Pupils are equal, round, reactive. She does not wear glasses. NECK:  Supple. CHEST:  Clear. HEART:  Regular rate and rhythm without murmur.  S1, S2 noted. ABDOMEN:  Soft, nontender.  Bowel sounds present. RECTAL/BREASTS/GENITALIA:  Not done, not part of present illness. EXTREMITIES:  Right knee, slight varus.  Range of motion 5 to 125, moderate crepitus, tender more medial and lateral.  Left knee, slight varus.  Range of motion 5 to 125, moderate crepitus, tender more medial and lateral.  IMPRESSION:  Osteoarthritis, bilateral knees.  PLAN:  Patient will be admitted to [facility] to undergo bilateral total knee replacement arthroplasty.  Surgery will be  performed by Dr. Ollen Gross.  She does want to look into the [facility], postoperatively.     Alexzandrew L. Julien Girt, P.A.C.   ______________________________ Ollen Gross, M.D.    ALP/MEDQ  D:  03/21/2011  T:  03/22/2011  Job:  [id number]  cc:   Luanna Cole. Lenord Fellers, M.D. Fax: [phone]  Electronically Signed by Patrica Duel P.A.C. on 03/25/2011 09:20:53 AM Electronically Signed by Ollen Gross M.D. on 03/29/2011 11:30:06 AM Electronically Signed by Ollen Gross M.D. on 03/29/2011 11:34:24 AM

## 2011-03-29 NOTE — Op Note (Addendum)
[name], [name]             ACCOUNT NO.:  [id number]  MEDICAL RECORD NO.:  [id number]  LOCATION:  1614                         FACILITY:  [facility]  PHYSICIAN:  Ollen Gross, M.D.    DATE OF BIRTH:  1955/09/02  DATE OF PROCEDURE: DATE OF DISCHARGE:                              OPERATIVE REPORT   PREOPERATIVE DIAGNOSIS:  Osteoarthritis, bilateral knees.  POSTOPERATIVE DIAGNOSIS:  Osteoarthritis, bilateral knees.  PROCEDURE:  Bilateral total knee arthroplasty.  SURGEON:  Ollen Gross, M.D.  ASSISTANT:  Alexzandrew L. Perkins, P.A.C.  ANESTHESIA:  Epidural with general.  ESTIMATED BLOOD LOSS:  Minimal.  DRAINS:  Autovac x1 each side.  TOURNIQUET TIME:  Right knee, 35 minutes at 300 mmHg.  Left knee, 35 minutes at 300 mmHg.  COMPLICATIONS:  None.  CONDITION:  Stable to recovery.  BRIEF CLINICAL INDICATION:  [name] is a 55 year old female with advanced bone-on-bone arthritis in the medial and patellofemoralcompartments of both knees.  She has had intractable pain and worsening function despite extensive nonoperative management including analgesics and injections.  She has had injections of cortisone and viscous supplements.  Injections are no longer working.  She presents now for bilateral total knee arthroplasty.  PROCEDURE IN DETAIL:  After successful administration of epidural anesthetic and subsequent general anesthetic, the patient was placed supine on the operating table and both lower extremities isolated from the perineum with plastic drapes and prepped and draped in usual sterile fashion.  Left knee hurt more, so did that one first.  Left lower extremity was wrapped in Esmarch, knee flexed, and tourniquet inflated to 300 mmHg.  Midline incision was made with a 10 blade through subcutaneous tissue to the extensor mechanism.  A fresh blade was used to make a medial parapatellar arthrotomy.  Soft tissue on the proximal medial tibia subperiosteally elevated  to the joint line with the knife and into the semimembranosus bursa with a Cobb elevator.  Soft tissue laterally is elevated with attention being paid to avoiding the patellar tendon or tibial tubercle.  Patella was everted, knee flexed 90 degrees, and ACL and PCL removed.  Drill was used to create a starting hole in the distal femur.  The canal was thoroughly irrigated.  5-degree left valgus alignment guide was placed and distal femoral cutting block is pinned to remove 10 mm off the distal femur.  Resection was made with an oscillating saw.  The tibia subluxed forward.  Retractors placed and menisci removed. Extramedullary tibial alignment guide was placed, referencing proximally at the medial aspect of the tibial tubercle and distally along the second metatarsal axis and tibial crest.  The block is pinned to remove 2 mm off the more deficient medial side.  Tibial resection is made with an oscillating saw.  Size 2.5 is the most appropriate tibial component in the proximal tibia.  A modular drill and keel punch for the size 2.5.  Femoral sizing guide was placed and the size 2.5 was most appropriate. Rotations marked off the epicondylar axis and confirmed by creating rectangular flexion gap at 90 degrees.  Block is pinned in this rotation and the anterior, posterior, and chamfer cuts were made.  Intercondylar block was placed and that cut was  made.  The trial 2.5 posterior stabilized femoral component was placed.  A 10 mm posterior stabilized rotating platform insert trial was placed.  Full extension was achieved with excellent varus-valgus and anterior-posterior balance throughout full range of motion.  The patella was everted and thickness measured to be 22 mm.  Freehand resection taken to 12 mm, 35 template was placed, lug holes were drilled, trial patella was placed and it tracks normally. Osteophytes then removed off the posterior femur with the trial in place.  All trials were  removed and the cut bone surfaces prepared with pulsatile lavage.  Cement was mixed and once ready for implantation, the size 2.5 mobile bearing tibial tray, 2.5 posterior stabilized femur, and 35 patella were cemented into place.  The patella was held with a clamp. Trial 10 mm inserts placed, knee held in full extension, all extruded cement removed.  Once cement was fully hardened, then the permanent 10 mm posterior stabilized rotating platform insert was placed into the tibial tray.  The wound was copiously irrigated with saline solution. The arthrotomy closed over Hemovac drain with interrupted #1 PDS. Flexion against gravity was 140 degrees. The patella tracks normally. Tourniquet time released 35 minutes.  The subcutaneous was then closed with interrupted 2-0 Vicryl and subcuticular running 4-0 Monocryl.  We then addressed the right knee.  Right lower extremity was wrapped in Esmarch, knee flexed, tourniquet inflated to 300 mmHg.  Midline incision was made with a 10 blade through the subcutaneous tissue to the extensor mechanism.  Fresh blade is used make a medial parapatellar arthrotomy. Soft tissue on the proximal medial tibia subperiosteal elevated to the joint line with the knife and into the semimembranosus bursa with a Cobb elevator.  Soft tissues laterally was elevated with attention being paid to avoiding the patellar tendon or the tibial tubercle.  Patella was everted, knee flexed 90 degrees, ACL and PCL removed.  Drill was used to create a starting hole in the distal femur.  Canal was thoroughly irrigated.  5 degree right valgus alignment guide was placed and the distal femoral cutting block is pinned to remove 10 mm off the distal femur.  Distal femoral resection was made with an oscillating saw.  The tibia subluxed forward and the menisci removed.  Extramedullary tibial alignment guide was placed referencing proximally at the medial aspect of the tibial tubercle and  distally along the second metatarsal axis and tibial crest.  Block was pinned to remove 2 mm off the more deficient medial side.  Tibial resection is made with an oscillating saw.  Size 2.5 is most appropriate tibial component.  The proximal tibia is prepared to modular drill and keel punch for the size 2.5.  Femoral sizing guide is placed, size 2.5 is most appropriate on the femur.  Rotations marked off the epicondylar axis and confirmed by creating rectangular flexion gap at 90 degrees.  Size 2.5 cutting block was placed in this rotation and the anterior, posterior, and chamfer cuts made.  Intercondylar block is placed and that cut is made.  Trial size 2.5 posterior stabilized femur is placed. A 10 mm posterior stabilized rotating platform insert trial was placed. With the 10, full extensions achieved with excellent varus-valgus and anterior-posterior balance, throughout full range of motion.  Patella was everted, thickness measured 22 mm.  Freehand resection taken 12 mm, 35 template was placed, lug holes were drilled, trial patella was placed and it tracks normally.  Osteophytes were then removed off the posterior femur with the  trial in place.  All trials were removed and the cut bone surfaces are prepared with pulsatile lavage.  Cement was mixed and once ready for implantation, the size 2.5 mobile bearing tibial tray, 2.5 posterior stabilized femur 35 patella were cemented in place, patella was held with a clamp.  Trial 10 mm inserts placed, knee held in full extension, all extruded cement removed.  When cement fully hardened, then the permanent 10 mm posterior stabilized rotating platform insert was placed in the tibial tray.  The wound was then copiously irrigated with saline solution.  The arthrotomy was then closed over Hemovac drain with interrupted 1 PDS.  Flexion against gravity to 135 degrees. Patella tracks normally.  The tourniquet was then released 35  minutes. Subcutaneous was closed with interrupted 2-0 Vicryl and subcuticular running 4-0 Monocryl.  Incisions were cleaned and dried and Steri-Strips and bulky sterile dressing was applied on both knees.  She is then placed into knee immobilizers, awakened, transported to recovery in stable condition.  Note that there were bone-on-bone changes in the medial and patellofemoral compartments of both knees.  Of note, the surgical assistant was a medical necessity for this procedure in order to perform in a safe and expeditious manner.  Surgical assistant was necessary for retraction of ligaments in vital neurovascular structures and also for proper positioning of the limb to allow for anatomic alignment of the prosthesis.     Ollen Gross, M.D.     FA/MEDQ  D:  03/22/2011  T:  03/22/2011  Job:  [id number]  Electronically Signed by Ollen Gross M.D. on 03/29/2011 11:30:11 AM Electronically Signed by Ollen Gross M.D. on 03/29/2011 11:33:42 AM

## 2011-03-31 DIAGNOSIS — Z96659 Presence of unspecified artificial knee joint: Secondary | ICD-10-CM

## 2011-03-31 DIAGNOSIS — M1710 Unilateral primary osteoarthritis, unspecified knee: Secondary | ICD-10-CM

## 2011-03-31 LAB — PROTIME-INR
INR: 2.27 — ABNORMAL HIGH (ref 0.00–1.49)
Prothrombin Time: 25.4 seconds — ABNORMAL HIGH (ref 11.6–15.2)

## 2011-04-01 LAB — PROTIME-INR: INR: 2.15 — ABNORMAL HIGH (ref 0.00–1.49)

## 2011-04-02 LAB — PROTIME-INR
INR: 2.21 — ABNORMAL HIGH (ref 0.00–1.49)
Prothrombin Time: 24.9 seconds — ABNORMAL HIGH (ref 11.6–15.2)

## 2011-04-03 DIAGNOSIS — M1710 Unilateral primary osteoarthritis, unspecified knee: Secondary | ICD-10-CM

## 2011-04-03 DIAGNOSIS — Z96659 Presence of unspecified artificial knee joint: Secondary | ICD-10-CM

## 2011-04-03 LAB — PROTIME-INR: INR: 2.19 — ABNORMAL HIGH (ref 0.00–1.49)

## 2011-04-04 ENCOUNTER — Inpatient Hospital Stay (HOSPITAL_COMMUNITY): Payer: PRIVATE HEALTH INSURANCE

## 2011-04-04 DIAGNOSIS — Z96659 Presence of unspecified artificial knee joint: Secondary | ICD-10-CM

## 2011-04-04 DIAGNOSIS — M1710 Unilateral primary osteoarthritis, unspecified knee: Secondary | ICD-10-CM

## 2011-04-04 NOTE — H&P (Signed)
[name], [name]             ACCOUNT NO.:  [id number]  MEDICAL RECORD NO.:  [id number]  LOCATION:  4030                         FACILITY:  [facility]  PHYSICIAN:  Erick Colace, M.D.DATE OF BIRTH:  10/07/1955  DATE OF ADMISSION:  03/27/2011 DATE OF DISCHARGE:                             HISTORY & PHYSICAL   REASON FOR ADMISSION:  Bilateral total knee replacement.  HISTORY:  A 55 year old female with severe end-stage degenerative joint disease bilateral knees, who failed conservative care was admitted to [facility] March 22, 2011, and underwent bilateral TKA on March 22, 2011, by Dr. Ollen Gross.  Coumadin was initiated for DVT prophylaxis with subcu Lovenox bridge until INR greater than 2.  She is weightbearing as tolerated bilateral lower extremities.  Postop pain control was initially with PCA pump, this was discontinued on March 23, 2011, and now has been managed with p.o. medications.  Postop anemia 8.8 dropping to 7.7.  Iron was added.  The patient asymptomatic with this, denying any chest pain, no shortness of breath.  Because of slow progress with her acute care, Physical Medicine Rehabilitation was consulted and evaluation was performed on March 23, 2011, felt to be appropriate for inpatient rehabilitation.  REVIEW OF SYSTEMS:  Positive for joint swelling.  She has a history of migraine headaches not currently having once.  She has had insomnia. She has had constipation.  OTHER PAST MEDICAL HISTORY:  Significant for kidney stones, hyperlipidemia.  PAST SURGICAL HISTORY:  ACDF C3-C4 level in 2003, kidney stones.  FAMILY HISTORY:  Cancer.  SOCIAL HISTORY:  Married.  Husband is a Occupational psychologist.  Two-level home, bedroom upstairs with 10 steps to entry.  No other local family.  FUNCTIONAL HISTORY:  Independent and driving prior to admission.  She was working out with a Psychologist, educational.  MEDICATIONS:  At home: 1. Elavil 10 mg at bedtime. 2.  Celebrex b.i.d. 3. Lovaza 2 p.o. b.i.d. 4. Melatonin 5 daily. 5. Lidoderm patch 5% q.12 h. p.r.n. 6. Estradiol patch. 7. Aspirin 81 mg daily.  LABORATORY DATA:  Last INR was 1.66 on March 27, 2011, last BUN 7 and creatinine 0.6.  Sodium 135, potassium 4.0.  PHYSICAL EXAMINATION:  VITAL SIGNS:  Blood pressure 133/80, pulse 102, respirations 18, O2 saturation 98.5. GENERAL:  Well-developed, well-nourished female in no acute distress. EYES:  Anicteric, noninjected. EXTERNAL ENT:  Normal. NECK:  Supple without adenopathy. RESPIRATIONS:  Respiratory effort is good. LUNGS:  Clear. HEART:  Regular rate and rhythm.  No rubs, murmurs, or extra sounds. EXTREMITY:  Trace edema bilateral ankles.  No calf tenderness. Bilateral knees have wounds healing nicely, clean and dry. No erythema. Steri-Strips intact. Range of motion in bilateral lower extremities is reduced. Ankle range of motion is full. ABDOMEN:  Positive bowel sounds, soft, nontender to palpation. PSYCH:  Mood, memory, affect and orientation are all intact. NEURO:  Cranial nerves II-XII intact.  Sensation normal bilateral upper and lower extremities.  POST ADMISSION PHYSICIAN EVALUATION: 1. Functional deficits secondary to bilateral total knee replacements     due to osteoarthritis on postoperative day #5. 2. The patient admitted to receive collaborative interdisciplinary     care between the physiatrist, rehab nursing staff,  and therapy     team. 3. The patient's level of medical complexity and substantial therapy     needs in context of that medical necessity cannot be provide at a     lesser intensity of care. 4. The patient has experienced substantial functional loss from her     baseline.  Upon functional assessment at the time of preadmission     screening, the patient was not yet evaluated.  Currently, the     patient is supervision with bed mobility, transfers are min assist.     With increased time 110 feet  ambulation minimal assist rolling     walker, +2 total 60% lower extremity dressing and bathing, setup     upper extremity dressing and bathing, +2 total assist with toilet     transfers.  Judging by the patient's diagnosis, physical exam,     functional history, the patient has a potential for functional     progress, which will result in measurable gains while on inpatient     rehab.  These gains will be of substantial and practical use upon     discharge to home in facilitating mobility, self-care, and     independence.  Interim change in medical status since preadmission     screening are detailed in history of present illness. 5. Physiatrist will provide 24 hour management of medical needs as     well as oversight of therapy plan/treatment and provide guidance     appropriate regarding interactions of the two. 6. A 24-hour rehab nursing will assess in the management of skin,     bowel, bladder, and wounds as well as help integrate therapy     concepts, techniques, and education. 7. PT will assess and treat for pre gait training, gait training,     endurance.  Goals are for modified independent level with all     mobility.8. OT will assess and treat for ADLs, upper extremity strengthening.     Of note, she does have some right shoulder pain, which will need     some parascapular strengthening.  Goals are for modified     independent level with upper body and lower body ADLs. 9. Case Management and Social Worker will assess and treat for     psychosocial issues and discharge planning. 10.Team conference will be held weekly to assess the patient's     progress/goals and to determine barriers to discharge. 11.The patient has demonstrated sufficient medical stability and     exercise capacity to tolerate at least 3 hours of therapy per day     at least 5 days per week. 12.Estimated length of stay is 7 days. 13.Prognosis for further functional improvement is good.  MEDICAL PROBLEM LIST  AND PLAN: 1. DVT prophylaxis currently on Coumadin.  Protocol per Pharmacy and     until INR is greater than or equal to 2.0 she will be on subcu     Lovenox 30 mg q.12 hours. 2. Migraines.  Continue prophylaxis with Elavil 10 at bedtime and     Imitrex for headaches. 3. ABLA.  Continue Trinsicon.  Recheck H and H in the morning. 4. Hyperlipidemia.  We will resume Lovaza b.i.d. 5. Motivation and mood appear to be adequate for full participation     and meet inpatient rehabilitation program.  Discussed Rehab     Medicine Services with family.  With the patient,  she agrees with     questions answered.  Erick Colace, M.D.     AEK/MEDQ  D:  03/27/2011  T:  03/28/2011  Job:  [id number]  cc:   Luanna Cole. Lenord Fellers, M.D. Ollen Gross, M.D.  Electronically Signed by Claudette Laws M.D. on 04/04/2011 12:34:15 PM

## 2011-04-05 DIAGNOSIS — M1710 Unilateral primary osteoarthritis, unspecified knee: Secondary | ICD-10-CM

## 2011-04-05 DIAGNOSIS — Z96659 Presence of unspecified artificial knee joint: Secondary | ICD-10-CM

## 2011-04-10 NOTE — Discharge Summary (Signed)
NAMEADYSEN, RAPHAEL             ACCOUNT NO.:  [id number]  MEDICAL RECORD NO.:  [id number]  LOCATION:  4030                         FACILITY:  [facility]  PHYSICIAN:  Erick Colace, M.D.DATE OF BIRTH:  May 20, 1956  DATE OF ADMISSION:  03/27/2011 DATE OF DISCHARGE:  04/05/2011                              DISCHARGE SUMMARY   DISCHARGE DIAGNOSES: 1. Bilateral total knee replacement secondary to degenerative joint     disease. 2. Coumadin for deep vein thrombosis prophylaxis. 3. Postoperative anemia. 4. Migraine headaches. 5. Pain management. A 55 year old white female with severe degenerative joint disease of bilateral knees and no relief with conservative care who was admitted to [facility] on [place], 2012 and underwent bilateral total knee replacement on March 22, 2011 per Dr. Lequita Halt.  Coumadin for deep vein thrombosis prophylaxis, subcutaneous Lovenox until INR greater than 2.  Weightbearing as tolerated bilateral lower extremities. Postoperative pain control with PCA discontinued on March 23, 2011. Postoperative anemia 8.8, 7.7, and monitored with iron supplement added. She denied any chest pain or shortness of breath.  She is minimal assist to ambulate with knee immobilizers.  She was admitted for comprehensive rehab program.  PAST MEDICAL HISTORY:  See discharge diagnoses.  No alcohol or tobacco.  ALLERGIES:  MORPHINE SULFATE.  SOCIAL HISTORY:  She is married.  Husband is a Occupational psychologist.  They live in a two-level home, bedroom upstairs, 10 steps to entry.  No other local family.  FUNCTIONAL HISTORY PRIOR TO ADMISSION:  Independent.  She does drive.  FUNCTIONAL STATUS UPON ADMISSION TO REHAB SERVICES:  Minimum to moderate assist bed mobility, minimum to moderate assist transfers, minimal assist ambulate 110 feet with a rolling walker and knee immobilizers, minimum to moderate assist lower body activities of daily living.  MEDICATIONS PRIOR TO  ADMISSION: 1. Elavil 10 mg at bedtime. 2. Lovaza twice daily. 3. Estradiol patch. 4. Aspirin 81 mg daily. 5. Celebrex twice daily. 6. Melatonin 5 mg daily.  PHYSICAL EXAMINATION:  VITAL SIGNS:  Blood pressure 133/80, pulse 102, temperature 98.5, respirations 18. GENERAL:  This was an alert female, in no acute distress, oriented x3. NEUROLOGIC:  Deep tendon reflexes 2+.  Sensation intact to light touch. Bilateral knees dressing clean and dry with Steri-Strips. LUNGS:  Clear to auscultation. CARDIAC:  Regular rate and rhythm. ABDOMEN:  Soft, nontender.  Good bowel sounds.  REHABILITATION HOSPITAL COURSE:  The patient was admitted to Inpatient Rehab Services with therapies initiated on a 3-hour daily basis consisting of physical therapy, occupational therapy, and rehabilitation nursing.  The following issues were addressed during the patient's rehabilitation stay.  Pertaining to Ms. Dacus's bilateral total knee replacement secondary to degenerative joint disease, remained stable. Surgical site healing nicely.  No signs of infection.  She was modified independent in her room, ambulating extended distances.  Her knee immobilizers have since been discontinued.  She was able to do her straight leg raises.  She was attending full therapies.  Pain management ongoing with the use of oxycodone immediate release as well as Robaxin. She did have a history of migraine headaches.  She remained on Elavil and Imitrex prior to hospital admission.  Postoperative anemia was stable.  Maintained on  iron supplement with latest hemoglobin of 8.7, hematocrit 25.3.  She remained on Coumadin for deep vein thrombosis prophylaxis with latest INR of 2.19.  She had been on subcutaneous Lovenox until INR greater than 2.  She will continue Coumadin until April 22, 2011, which was to be managed by home health nursing.  She will have home health agency, [facility].  Her blood pressures were well controlled with no  current hypertensive medications.  No bowel or bladder disturbances, other than some mild constipation.  It was resolved with laxative assistance.  The patient received weekly collaborative interdisciplinary team conferences to discuss estimated length of stay, family teaching, and any barriers to discharge.  She was supervision ambulation, ambulating extended household distances. Overall, independent for activities of daily living with simple set up, other than some minimal assist for lower body dressing.  Her strength and endurance had greatly improved throughout her rehab course.  She was encouraged overall progress and plans to be discharged to home with ongoing therapies dictated as per [employer].  LABORATORY DATA:  Latest labs showed an INR of 2.19.  Urine culture:  No growth.  Sodium 136, potassium 3.8, BUN 10, creatinine 0.7.  DISCHARGE MEDICATIONS: 1. Elavil 10 mg at bedtime. 2. Lovaza 1 g twice daily. 3. Senokot tablets, 2 tablets at bedtime. 4. Trinsicon 1 capsule twice daily. 5. Coumadin for deep vein thrombosis prophylaxis with latest dose of 6     mg, adjusted accordingly for an INR of 2.0-3.0. 6. Robaxin 500 mg every 6 hours as needed muscle spasms. 7. Oxycodone immediate release 5 mg 1 or 2 tablets every 4 hours as     needed pain, dispense of 90 tablets.  DIET:  Regular.  SPECIAL INSTRUCTIONS:  Continue therapies as advised per [employer]. Home health therapies per [facility] to manage Coumadin therapy, which was to be completed on April 22, 2011.  She should follow up with Dr. Ollen Gross, Orthopedic Services, call for appointment; Dr. Sharlet Salina, Medical Management; as well as Dr. Claudette Laws at the outpatient rehab service office as needed.     Mariam Dollar, P.A.   ______________________________ Erick Colace, M.D.    DA/MEDQ  D:  04/04/2011  T:  04/04/2011  Job:  [id number]  cc:   Erick Colace, M.D. Luanna Cole.  Lenord Fellers, M.D. Ollen Gross, M.D.  Electronically Signed by Mariam Dollar P.A. on 04/07/2011 03:18:50 PM Electronically Signed by Claudette Laws M.D. on 04/10/2011 11:26:55 AM

## 2011-07-11 ENCOUNTER — Other Ambulatory Visit: Payer: Self-pay | Admitting: Internal Medicine

## 2011-12-04 ENCOUNTER — Other Ambulatory Visit: Payer: Self-pay | Admitting: Gynecology

## 2011-12-04 DIAGNOSIS — Z1231 Encounter for screening mammogram for malignant neoplasm of breast: Secondary | ICD-10-CM

## 2012-01-01 ENCOUNTER — Ambulatory Visit
Admission: RE | Admit: 2012-01-01 | Discharge: 2012-01-01 | Disposition: A | Discharge status: Home / Self Care | Payer: PRIVATE HEALTH INSURANCE | Source: Ambulatory Visit | Attending: Gynecology | Admitting: Gynecology

## 2012-01-01 ENCOUNTER — Ambulatory Visit: Payer: PRIVATE HEALTH INSURANCE

## 2012-01-01 DIAGNOSIS — Z1231 Encounter for screening mammogram for malignant neoplasm of breast: Secondary | ICD-10-CM

## 2012-01-04 ENCOUNTER — Ambulatory Visit (INDEPENDENT_AMBULATORY_CARE_PROVIDER_SITE_OTHER): Payer: PRIVATE HEALTH INSURANCE | Admitting: Sports Medicine

## 2012-01-04 ENCOUNTER — Encounter: Payer: Self-pay | Admitting: Sports Medicine

## 2012-01-04 DIAGNOSIS — S43429A Sprain of unspecified rotator cuff capsule, initial encounter: Secondary | ICD-10-CM

## 2012-01-04 DIAGNOSIS — M1990 Unspecified osteoarthritis, unspecified site: Secondary | ICD-10-CM

## 2012-01-04 DIAGNOSIS — M1120 Other chondrocalcinosis, unspecified site: Secondary | ICD-10-CM

## 2012-01-04 DIAGNOSIS — M75101 Unspecified rotator cuff tear or rupture of right shoulder, not specified as traumatic: Secondary | ICD-10-CM

## 2012-01-04 MED ORDER — COLCHICINE 0.6 MG PO TABS: 0.6000 mg | ORAL_TABLET | Freq: Two times a day (BID) | ORAL | Status: DC

## 2012-01-04 NOTE — Patient Instructions (Addendum)
You have a tear in your supraspinatus tendon  For rotator cuff- make sure you use light weight to do "spokes of wheel exercise"- do not raise arm past level of pain   Do External and Internal rotations holding light weight  Try Colcrys 0.6 mg twice daily  Use compression sleeve on knee  Recumbent bike is also very good for your knees   Please follow up in 1 month  Thank you for seeing [place] today!

## 2012-01-04 NOTE — Assessment & Plan Note (Signed)
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

## 2012-01-04 NOTE — Assessment & Plan Note (Signed)
Begin a series of light weight rehab exercises for RC  I would not use NTG yet as hx of migraine  Reck in 4 wks  If not improved we can consider NTG with headache warning  OK to up clebrex to bid

## 2012-01-04 NOTE — Progress Notes (Signed)
  Subjective:    Patient ID: [name], female    DOB: 1955-09-20, 56 y.o.   MRN: [id number]  HPI  Pt presents to clinic for evaluation of rt shoulder pain x2 yrs. No known shoulder injury. Painful with certain movements- especially with shoulder posterior shoulder extension like with playing basoon. Crossing over chest also causes pain posteriorly. Works with a Systems analyst. Has a negative shoulder x-ray ordered by Dr. Despina Hick.   Hx of bilat knee replacements 03/2011 by Dr. Despina Hick. Still has significant knee pain, had arthroscope on lt to clean joint after TKR.  Takes amitriptyline 25 mg for knee pain.  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 Nigel Sloop Crystals seen on fluid analysis  Soc:  Wife of Dr Paulina Fusi     Review of Systems     Objective:   Physical Exam NAD  Knee exams: Bilat anterior knee scars from tkr bilat Mild warmth over lt knee with effusion, less on rt Lt 125 degrees flexion, -5 degrees extension Rt - 125 degrees flexion, -2 degrees extension Lt slight crepitation under patella Hip abduction strength good bilat   Rt shoulder exam: Equal back scratch with slight pain on rt Speed's neg Yergason's neg ER on rt at 90 deg strong, but slightly painful, IR strong Cross over test very painful AC joint ttp Abduction strong Empty can neg Hawkins neg Obrien's strong, slight pain  MSK [place] There is a distal tear of the supraspinatus tendon on articular side This is seen on post 1/2 of tendon On dynamic motion this gaps but does not exceed 1 cm Remainder of RC tendons and AC joint are normal BT is normal  Left knee shows a large knee effusion There are calcific crystals noted in soft tissue swelling below patellar tendon Less change on RT but some calcific changes noted      Assessment & Plan:

## 2012-02-06 ENCOUNTER — Encounter: Payer: Self-pay | Admitting: Sports Medicine

## 2012-02-06 ENCOUNTER — Ambulatory Visit (INDEPENDENT_AMBULATORY_CARE_PROVIDER_SITE_OTHER): Payer: PRIVATE HEALTH INSURANCE | Admitting: Sports Medicine

## 2012-02-06 VITALS — BP 130/64 | HR 88 | Ht 65.0 in | Wt 150.0 lb

## 2012-02-06 DIAGNOSIS — M75101 Unspecified rotator cuff tear or rupture of right shoulder, not specified as traumatic: Secondary | ICD-10-CM

## 2012-02-06 DIAGNOSIS — M1990 Unspecified osteoarthritis, unspecified site: Secondary | ICD-10-CM

## 2012-02-06 DIAGNOSIS — S43429A Sprain of unspecified rotator cuff capsule, initial encounter: Secondary | ICD-10-CM

## 2012-02-06 MED ORDER — NITROGLYCERIN 0.2 MG/HR TD PT24: MEDICATED_PATCH | TRANSDERMAL | Status: DC

## 2012-02-06 NOTE — Assessment & Plan Note (Signed)
Partial thickness tear on RT from articular surface  Trial with NTG  HEP  Note left is normal to [place]  Reck 1 month

## 2012-02-06 NOTE — Patient Instructions (Addendum)
Nitroglycerin Protocol   Apply 1/4 nitroglycerin patch to affected area daily.  Change position of patch within the affected area every 24 hours.  You may experience a headache during the first 1-2 weeks of using the patch, these should subside.  If you experience headaches after beginning nitroglycerin patch treatment, you may take your preferred over the counter pain reliever.  Another side effect of the nitroglycerin patch is skin irritation or rash related to patch adhesive.  Please notify our office if you develop more severe headaches or rash, and stop the patch.  Tendon healing with nitroglycerin patch may require 12 to 24 weeks depending on the extent of injury.  Men should not use if taking Viagra, Cialis, or Levitra.   Do not use if you have migraines or rosacea.    Start with Nitroglycerin patch 1/8 patch for a few days, then increase to 1/4 patch as tolerated  Continue doing suggested rotator cuff exercises  Continue wearing compression sleeves on knees   Please follow up in 1 month  Thank you for seeing [place] today!

## 2012-02-06 NOTE — Progress Notes (Signed)
  Subjective:    Patient ID: [name], female    DOB: 1955/07/01, 56 y.o.   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   Had bilat TKR about 1 year ago  Patient continues to complain of right shoulder pain. States the left is also starting to bother her some.  Has been doing home exercises. Is taking colchicine and celebrex. Does not really feel that her shoulder is any better.  Patient also complains of continued left knee pain. Tried amitriptyline as prescribed by her orthopedic surgeon but has not really helped. Does not really feel like the colchicine has helped either. Continues to complain of swelling   Review of Systems     Objective:   Physical Exam Left knee: Persistent effusion noted but much less Decreased warm of knee. Full ROM on knee flexion and extension Ant midline scar noted bilat from TKR  Right shoulder: Positive impingement signs, positive hawkins and neer's Negative empty can   Ultrasound:  Left knee: Decreased size of joint effusion Calcifications again noted RT knee shows similar mild effusion with some calcification in SP pouch  Right shoulder Normal biceps tendon Persistent tear of right supraspinatus tendon with gapping Subscapularis and infraspinatus intact      Assessment & Plan:  #1. Left knee pain and effusion.  Suspect that patient had flare of CPPD after knee replacement surgery, which is still resolving. Effusion size and warmth on palpation improved today. Suspect pain is due to CPPD and effusion rather than nerve injury at this time.  - Continue colchicine for a total of 3 months  - Continue celebrex bid  #2. 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

## 2012-02-06 NOTE — Assessment & Plan Note (Signed)
I think surgical TKR is stable bilat Left is slt looser than RT on A/P testing  Plan to cont meds as per note

## 2012-03-05 ENCOUNTER — Ambulatory Visit (INDEPENDENT_AMBULATORY_CARE_PROVIDER_SITE_OTHER): Payer: PRIVATE HEALTH INSURANCE | Admitting: Sports Medicine

## 2012-03-05 VITALS — BP 120/80 | Ht 65.0 in | Wt 145.0 lb

## 2012-03-05 DIAGNOSIS — M1990 Unspecified osteoarthritis, unspecified site: Secondary | ICD-10-CM

## 2012-03-05 DIAGNOSIS — S43429A Sprain of unspecified rotator cuff capsule, initial encounter: Secondary | ICD-10-CM

## 2012-03-05 DIAGNOSIS — M1120 Other chondrocalcinosis, unspecified site: Secondary | ICD-10-CM

## 2012-03-05 DIAGNOSIS — M75101 Unspecified rotator cuff tear or rupture of right shoulder, not specified as traumatic: Secondary | ICD-10-CM

## 2012-03-05 MED ORDER — OMEGA-3-ACID ETHYL ESTERS 1 G PO CAPS: 2.0000 g | ORAL_CAPSULE | Freq: Two times a day (BID) | ORAL | Status: DC

## 2012-03-05 MED ORDER — COLCHICINE 0.6 MG PO TABS: 0.6000 mg | ORAL_TABLET | Freq: Two times a day (BID) | ORAL | Status: DC

## 2012-03-05 MED ORDER — CELECOXIB 200 MG PO CAPS: 200.0000 mg | ORAL_CAPSULE | Freq: Two times a day (BID) | ORAL | Status: DC

## 2012-03-05 NOTE — Assessment & Plan Note (Signed)
Cont colchicine

## 2012-03-05 NOTE — Assessment & Plan Note (Signed)
Suspect ongoing CPPD reaction is key to persistent knee swelling  TKR seems stable

## 2012-03-05 NOTE — Patient Instructions (Addendum)
Add scapular stabilization  Keep up rotator cuff exercises  Use compression sleeve when active and 1 hour afterwards  Keep up NTG - experiment with 1/2 patch  RTC and rescan with [place] in 4 weeks  Keep up colchicine

## 2012-03-05 NOTE — Progress Notes (Signed)
  Subjective:    Patient ID: [name], female    DOB: 1956-06-03, 56 y.o.   MRN: [id number]  HPI Pt states that she noted a 10% improvement in R shoulder discomfort.  She feels "optimistic" because she is now better able to play her bassoon.  States that the shoulder has hurt for 2 years with worsening in the last year.  States that 1/4 nitro patch works well.  Does her prescribed shoulders exercises BID.   No problems with headaches on NTG  Motion of shoulder feels better with personal workouts  Knees still feel painful   Review of Systems All negative except as in HPI    Objective:   Physical Exam NAD  R shoulder anteriorly rotated as compared to left.   Negative sulcus sign Hawkins and Neer negative + R empty can +lift off pain (suprapinatus stretch)  Quick scan on [place] RC tear in SST is smaller with limited edema  Knees Very small effusion on RT Left shows moderate fluid up the suprapatellar pouch      Assessment & Plan:  1. Rotator cuff injury- continue shoulder exercises as prescribed, add rhomboid/intrascapular muscle strengthening exercises; refilled colchicine and and celebrex until appointment with PCP

## 2012-03-05 NOTE — Assessment & Plan Note (Signed)
We will try half NTG patch and see if she can tolerate this  See other instructions

## 2012-03-13 ENCOUNTER — Other Ambulatory Visit (HOSPITAL_COMMUNITY): Payer: Self-pay | Admitting: Orthopedic Surgery

## 2012-03-13 DIAGNOSIS — M25561 Pain in right knee: Secondary | ICD-10-CM

## 2012-03-25 ENCOUNTER — Other Ambulatory Visit: Payer: PRIVATE HEALTH INSURANCE | Admitting: Internal Medicine

## 2012-03-25 ENCOUNTER — Encounter: Payer: Self-pay | Admitting: Internal Medicine

## 2012-03-25 ENCOUNTER — Ambulatory Visit (INDEPENDENT_AMBULATORY_CARE_PROVIDER_SITE_OTHER): Payer: PRIVATE HEALTH INSURANCE | Admitting: Internal Medicine

## 2012-03-25 ENCOUNTER — Other Ambulatory Visit: Payer: Self-pay | Admitting: Internal Medicine

## 2012-03-25 VITALS — BP 126/86 | HR 80 | Temp 98.9°F | Ht 64.25 in | Wt 150.0 lb

## 2012-03-25 DIAGNOSIS — J309 Allergic rhinitis, unspecified: Secondary | ICD-10-CM

## 2012-03-25 DIAGNOSIS — Z0000 Encounter for general adult medical examination without abnormal findings: Secondary | ICD-10-CM

## 2012-03-25 DIAGNOSIS — M25562 Pain in left knee: Secondary | ICD-10-CM

## 2012-03-25 DIAGNOSIS — Z8669 Personal history of other diseases of the nervous system and sense organs: Secondary | ICD-10-CM

## 2012-03-25 DIAGNOSIS — Z8739 Personal history of other diseases of the musculoskeletal system and connective tissue: Secondary | ICD-10-CM

## 2012-03-25 DIAGNOSIS — M25569 Pain in unspecified knee: Secondary | ICD-10-CM

## 2012-03-25 LAB — LIPID PANEL
Cholesterol: 185 mg/dL (ref 0–200)
HDL: 66 mg/dL (ref >39)
Total CHOL/HDL Ratio: 2.8 Ratio
Triglycerides: 51 mg/dL (ref <150)
VLDL: 10 mg/dL (ref 0–40)

## 2012-03-25 LAB — CBC WITH DIFFERENTIAL/PLATELET
Eosinophils Absolute: 0.4 10*3/uL (ref 0.0–0.7)
Eosinophils Relative: 10 % — ABNORMAL HIGH (ref 0–5)
Hemoglobin: 13.5 g/dL (ref 12.0–15.0)
Lymphs Abs: 1.8 10*3/uL (ref 0.7–4.0)
MCH: 30.4 pg (ref 26.0–34.0)
MCHC: 33.4 g/dL (ref 30.0–36.0)
MCV: 91 fL (ref 78.0–100.0)
Monocytes Relative: 11 % (ref 3–12)
RBC: 4.44 MIL/uL (ref 3.87–5.11)

## 2012-03-25 LAB — COMPREHENSIVE METABOLIC PANEL
BUN: 13 mg/dL (ref 6–23)
CO2: 28 mEq/L (ref 19–32)
Creat: 0.85 mg/dL (ref 0.50–1.10)
Glucose, Bld: 86 mg/dL (ref 70–99)
Total Bilirubin: 0.7 mg/dL (ref 0.3–1.2)

## 2012-03-25 LAB — POCT URINALYSIS DIPSTICK
Bilirubin, UA: NEGATIVE
Glucose, UA: NEGATIVE
Ketones, UA: NEGATIVE
Leukocytes, UA: NEGATIVE
Protein, UA: NEGATIVE

## 2012-03-25 MED ORDER — CELECOXIB 200 MG PO CAPS: 200.0000 mg | ORAL_CAPSULE | Freq: Two times a day (BID) | ORAL | Status: DC

## 2012-03-25 MED ORDER — OMEGA-3-ACID ETHYL ESTERS 1 G PO CAPS: 2.0000 g | ORAL_CAPSULE | Freq: Two times a day (BID) | ORAL | Status: DC

## 2012-03-25 MED ORDER — AMITRIPTYLINE HCL 10 MG PO TABS: 10.0000 mg | ORAL_TABLET | Freq: Every day | ORAL | Status: DC

## 2012-03-25 NOTE — Patient Instructions (Addendum)
We will do  lab work for arthritis. Bone density study. Flu vaccine given

## 2012-03-26 LAB — RHEUMATOID FACTOR: Rhuematoid fact SerPl-aCnc: <10 IU/mL (ref <=14)

## 2012-03-26 LAB — SEDIMENTATION RATE: Sed Rate: 1 mm/hr (ref 0–22)

## 2012-03-26 LAB — CYCLIC CITRUL PEPTIDE ANTIBODY, IGG: Cyclic Citrullin Peptide Ab: <2 U/mL (ref 0.0–5.0)

## 2012-03-27 ENCOUNTER — Encounter (HOSPITAL_COMMUNITY)
Admission: RE | Admit: 2012-03-27 | Discharge: 2012-03-27 | Disposition: A | Discharge status: Home / Self Care | Payer: PRIVATE HEALTH INSURANCE | Source: Ambulatory Visit | Attending: Orthopedic Surgery | Admitting: Orthopedic Surgery

## 2012-03-27 DIAGNOSIS — M25561 Pain in right knee: Secondary | ICD-10-CM

## 2012-03-27 DIAGNOSIS — Z96659 Presence of unspecified artificial knee joint: Secondary | ICD-10-CM | POA: Insufficient documentation

## 2012-03-27 DIAGNOSIS — M25469 Effusion, unspecified knee: Secondary | ICD-10-CM | POA: Insufficient documentation

## 2012-03-27 DIAGNOSIS — M25569 Pain in unspecified knee: Secondary | ICD-10-CM | POA: Insufficient documentation

## 2012-03-27 MED ORDER — TECHNETIUM TC 99M MEDRONATE IV KIT
24.0000 | PACK | Freq: Once | INTRAVENOUS | Status: AC | PRN
  Administered 2012-03-27: 24 via INTRAVENOUS

## 2012-03-31 NOTE — Progress Notes (Signed)
Subjective:    Patient ID: [name], female    DOB: [birth year], 56 y.o.   MRN: [id number]  HPI 56 year old white female in today for health maintenance exam and evaluation of medical problems. A year ago she had bilateral knee replacement surgery. Recently she has had considerable issues with both knees. She has been back to orthopedist regarding this. History of migraine headaches. Allergic rhinitis and CPPD arthritis.  Allergic to grass, pollens, molds. NKDA.  Completed 4 years of college. Is a homemaker. Husband is a Marine scientist with [employer]. 2 children- a daughter and a son both adults. Nonsmoker; social ETOH Fhx: Father died of aplastic anemia with hx CVA;Mother with hx of subependymoma of 4th ventricle; patient's sister died of LE sarcoma at age 33    Review of Systems  Constitutional: Negative.   HENT: Negative.   Eyes: Negative.   Respiratory: Negative.   Cardiovascular: Negative.   Gastrointestinal: Negative.   Endocrine: Negative.   Genitourinary: Negative.   Musculoskeletal: Positive for arthralgias.       Bilateral knee pain s/p joint replacement  Allergic/Immunologic: Positive for environmental allergies.  Neurological:       History of migraine H/As       Objective:   Physical Exam  Constitutional: She is oriented to person, place, and time. She appears well-developed and well-nourished. No distress.  HENT:  Head: Normocephalic and atraumatic.  Left Ear: External ear normal.  Mouth/Throat: Oropharynx is clear and moist. No oropharyngeal exudate.  Eyes: Conjunctivae and EOM are normal. Pupils are equal, round, and reactive to light. Right eye exhibits no discharge. No scleral icterus.  Neck: Neck supple. No JVD present. No thyromegaly present.  Cardiovascular: Normal rate, regular rhythm and intact distal pulses.   No murmur heard. Pulmonary/Chest: Effort normal and breath sounds normal. No respiratory distress. She has no wheezes. She has  no rales. She exhibits no tenderness.  Breasts normal female  Abdominal: Soft. Bowel sounds are normal. She exhibits no distension. There is no tenderness. There is no rebound and no guarding.  Genitourinary:  deferred  Musculoskeletal: She exhibits no edema.  Decreased ROM knees  Lymphadenopathy:    She has no cervical adenopathy.  Neurological: She is alert and oriented to person, place, and time. She has normal reflexes. No cranial nerve deficit. Coordination normal.  Skin: Skin is warm and dry. No rash noted. She is not diaphoretic.  Psychiatric: She has a normal mood and affect. Her behavior is normal. Judgment and thought content normal.          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 Continue same meds and follow up with orthopedist for knee pain              Addendum: Received e-mail from her husband 03/31/2012 they are concerned about her white blood cell count of 3600. Had normal white blood cell count a year ago but that was during time she had had bilateral knee replacement. She was anemic at that time from the knee replacement surgery. We have agreed that we will repeat her CBC with differential with a pathology review of smear in approximately 6 weeks i.e. early to mid December. In the meantime she will stop colchicine but continue Celebrex. Colchicine doesn't seem to be helping arthropathy very much according to her husband. Patient's father had aplastic  anemia and that is of concern to her.

## 2012-04-02 ENCOUNTER — Ambulatory Visit (INDEPENDENT_AMBULATORY_CARE_PROVIDER_SITE_OTHER): Payer: PRIVATE HEALTH INSURANCE | Admitting: Sports Medicine

## 2012-04-02 VITALS — BP 110/60 | Ht 64.0 in | Wt 145.0 lb

## 2012-04-02 DIAGNOSIS — S43429A Sprain of unspecified rotator cuff capsule, initial encounter: Secondary | ICD-10-CM

## 2012-04-02 DIAGNOSIS — M1990 Unspecified osteoarthritis, unspecified site: Secondary | ICD-10-CM

## 2012-04-02 DIAGNOSIS — M1120 Other chondrocalcinosis, unspecified site: Secondary | ICD-10-CM

## 2012-04-02 DIAGNOSIS — M75101 Unspecified rotator cuff tear or rupture of right shoulder, not specified as traumatic: Secondary | ICD-10-CM

## 2012-04-02 NOTE — Assessment & Plan Note (Signed)
PCP discontinued colchicine due to WBC of 3.6.

## 2012-04-02 NOTE — Assessment & Plan Note (Signed)
Improved both subjectively and on ultrasound.  Will continue with 1/2 patch NTG daily and her exercises.  Follow up in 4-6 weeks.

## 2012-04-02 NOTE — Assessment & Plan Note (Signed)
Some increased movement in the replaced knee joints.  Recommended wearing compression sleeves daily to help with effusions and stability.

## 2012-04-02 NOTE — Patient Instructions (Addendum)
Keep using 1/2 patch daily on your shoulder.   Continue the shoulder exercises.  Please use the compression sleeves every day for your knees. More compression = less swelling = less motion = less pain. Try not to sit with your knees bent 90 degrees.

## 2012-04-02 NOTE — Progress Notes (Signed)
  Subjective:    Patient ID: [name], female    DOB: [birth year], 56 y.o.   MRN: [id number]  HPI [name] is here today to follow up her right shoulder pain from supraspinatus partial tear.  Reports being 50% better.  Able to play bassoon for longer periods of time now.  Using 1/2 patch NTG without problems.  Compliant with exercises.  She clearly feels that her motion is better as well  Knees continue to have some pain, particularly after sitting in cramped spaces.  Uses compression sleeves with activity. She stopped colchicine secondary to low white counts She worries about having to continue to use Celebrex but this does help her knee pain She feels clicking in the left knee Compression sleeves have helped her knees feel better and she got another one for her right knee now   Review of Systems     Objective:   Physical Exam Gen: alert, cooperative, NAD Right Shoulder: Inspection reveals no abnormalities, atrophy or asymmetry. Palpation is normal with no tenderness over AC joint or bicipital groove. ROM is full in all planes. Rotator cuff strength normal throughout. + impingement with mildly positive Neer, Hawkin's, empty can sign. No painful arc and no drop arm sign.  Right knee: some lateral movement of artifical joint Left knee: some AP and lateral movement of the artifical joint  MSK ultrasound: Right supraspinatus with partial tear appearing 90% healed.  No hypoechogenicity along biceps tendon.   Right knee with increased hypoechogenicity of the suprapatellar pouch.  Left knee with increased hypoechogenicity of the suprapatellar pouch. Mild calcific deposits in the left suprapatellar soft tissue     Assessment & Plan:

## 2012-05-14 ENCOUNTER — Ambulatory Visit (INDEPENDENT_AMBULATORY_CARE_PROVIDER_SITE_OTHER): Payer: PRIVATE HEALTH INSURANCE | Admitting: Sports Medicine

## 2012-05-14 ENCOUNTER — Encounter: Payer: Self-pay | Admitting: Sports Medicine

## 2012-05-14 VITALS — BP 123/78 | HR 73 | Ht 64.0 in | Wt 145.0 lb

## 2012-05-14 DIAGNOSIS — M1120 Other chondrocalcinosis, unspecified site: Secondary | ICD-10-CM

## 2012-05-14 DIAGNOSIS — M75101 Unspecified rotator cuff tear or rupture of right shoulder, not specified as traumatic: Secondary | ICD-10-CM

## 2012-05-14 DIAGNOSIS — S43429A Sprain of unspecified rotator cuff capsule, initial encounter: Secondary | ICD-10-CM

## 2012-05-14 MED ORDER — NITROGLYCERIN 0.2 MG/HR TD PT24: MEDICATED_PATCH | TRANSDERMAL | Status: DC

## 2012-05-14 NOTE — Progress Notes (Signed)
  Subjective:    Patient ID: [name], female    DOB: Jul 16, 1955, 56 y.o.   MRN: [id number]  HPI She is here for follow-up of right supraspinatus tear.  She feels like she is doing better. The pain does not really limit her daily activities, and she is playing bassoon regularly again. She has occasional pain when she reaches back for something.  She is using the NTG 1/2 patch daily and denies headaches or other side effects.   She still is aware of pain in the right shoulder and so does not want to stop the nitroglycerin and she feels she is just now getting to the point to where she can do all the activities she wishes She has just completed 3 months of treatment  Review of Systems  Allergies, medication, past medical history reviewed.      Objective:   Physical Exam GEN: NAD RIGHT SHOULDER:    No tenderness to palpation   ROM: full abduction/adduction; internal rotation able to touch bottom of opposite scapula (able to fully touch opposite scapula with left arm)   Maneuvers: mildly positive Neers and empty can test   Strength: 5/5 but mildly decreased compared to left with empty can and at full abduction  Ultrasound of right shoulder Decreased Doppler over supraspinatous Bicipital tendon is normal A.c. joint show some small calcifications Infraspinatus teres minor and subscapularis are all normal  Supraspinatous tendon still shows a small partial tear that is no longer gapping There is calcification around this tear and hypoechoic change    Assessment & Plan:

## 2012-05-14 NOTE — Assessment & Plan Note (Addendum)
It is improving. She is about 90% at baseline.  Continue NTG 1/2 patch daily and follow-up in 2 months.   Continue on home exercise program

## 2012-05-14 NOTE — Patient Instructions (Addendum)
Continue to use NTG 1/2 patch daily near acromion of shoulder Follow-up in 2 months   For your knees: Continue Celebrex as needed for pain  Continue compression as needed

## 2012-05-14 NOTE — Assessment & Plan Note (Signed)
She still has persistent knee pain and Celebrex does help she also takes Tylenol at night  Colchicine did not help that much and lower her white count  She would like to see a rheumatologist to see if other medications might be tried for her pseudogout  I suggested we discuss this with Dr. Lenord Fellers and see if that is a reasonable plan

## 2012-06-13 ENCOUNTER — Other Ambulatory Visit: Payer: PRIVATE HEALTH INSURANCE | Admitting: Internal Medicine

## 2012-06-13 ENCOUNTER — Other Ambulatory Visit: Payer: Self-pay | Admitting: Internal Medicine

## 2012-06-13 DIAGNOSIS — Z780 Asymptomatic menopausal state: Secondary | ICD-10-CM

## 2012-06-13 DIAGNOSIS — R6889 Other general symptoms and signs: Secondary | ICD-10-CM

## 2012-06-13 LAB — CBC WITH DIFFERENTIAL/PLATELET
Eosinophils Relative: 2 % (ref 0–5)
Hemoglobin: 13.2 g/dL (ref 12.0–15.0)
Lymphocytes Relative: 36 % (ref 12–46)
Lymphs Abs: 1.5 10*3/uL (ref 0.7–4.0)
MCH: 30.7 pg (ref 26.0–34.0)
MCV: 93 fL (ref 78.0–100.0)
Monocytes Relative: 10 % (ref 3–12)
Neutrophils Relative %: 51 % (ref 43–77)
Platelets: 317 10*3/uL (ref 150–400)
RBC: 4.3 MIL/uL (ref 3.87–5.11)
WBC: 4.3 10*3/uL (ref 4.0–10.5)

## 2012-06-13 NOTE — Progress Notes (Signed)
  Subjective:    Patient ID: [name], female    DOB: 17-Jun-1955, 57 y.o.   MRN: [id number]  HPI Repeating CBC because of WBC of 3300 with PE a few weeks ago. Has stopped some of her meds for knee pain since then. Have reviewed WBCs over the years and most have been in 10-5998 range except during knee replacement surgery. Pt is worried about WBC because of family history of myelodysplasia in father.    Review of Systems     Objective:   Physical Exam        Assessment & Plan:

## 2012-06-14 LAB — PATHOLOGIST SMEAR REVIEW

## 2012-06-18 ENCOUNTER — Telehealth: Payer: Self-pay | Admitting: Internal Medicine

## 2012-06-18 NOTE — Telephone Encounter (Signed)
Left message on patient's home telephone answering machine regarding recent CBC results. Improvement in white blood cell count. Am mailing her several years of previous CBC results in addition to most recent CBC. Had pathology review her blood smear and appeared to be within normal limits. Recommend repeating CBC spring 2014 in about 4 months. She should call for an appointment.

## 2012-06-25 ENCOUNTER — Ambulatory Visit
Admission: RE | Admit: 2012-06-25 | Discharge: 2012-06-25 | Disposition: A | Discharge status: Home / Self Care | Payer: PRIVATE HEALTH INSURANCE | Source: Ambulatory Visit | Attending: Internal Medicine | Admitting: Internal Medicine

## 2012-06-25 DIAGNOSIS — Z780 Asymptomatic menopausal state: Secondary | ICD-10-CM

## 2012-07-10 ENCOUNTER — Telehealth: Payer: Self-pay | Admitting: Internal Medicine

## 2012-07-10 NOTE — Telephone Encounter (Signed)
Please see that pt gets Celebrex 200mg  bid #180 with prn one year refills to mail order pharmacy

## 2012-07-11 ENCOUNTER — Other Ambulatory Visit: Payer: Self-pay

## 2012-07-11 MED ORDER — CELECOXIB 200 MG PO CAPS: 200.0000 mg | ORAL_CAPSULE | Freq: Two times a day (BID) | ORAL | Status: DC

## 2012-07-16 ENCOUNTER — Ambulatory Visit: Payer: PRIVATE HEALTH INSURANCE | Admitting: Sports Medicine

## 2012-08-18 ENCOUNTER — Encounter: Payer: Self-pay | Admitting: Internal Medicine

## 2012-11-27 ENCOUNTER — Other Ambulatory Visit: Payer: Self-pay

## 2012-11-27 DIAGNOSIS — Z1231 Encounter for screening mammogram for malignant neoplasm of breast: Secondary | ICD-10-CM

## 2013-01-01 ENCOUNTER — Ambulatory Visit: Payer: PRIVATE HEALTH INSURANCE

## 2013-01-15 ENCOUNTER — Ambulatory Visit
Admission: RE | Admit: 2013-01-15 | Discharge: 2013-01-15 | Disposition: A | Discharge status: Home / Self Care | Payer: PRIVATE HEALTH INSURANCE | Source: Ambulatory Visit

## 2013-01-15 DIAGNOSIS — Z1231 Encounter for screening mammogram for malignant neoplasm of breast: Secondary | ICD-10-CM

## 2013-01-22 ENCOUNTER — Other Ambulatory Visit: Payer: Self-pay | Admitting: Gynecology

## 2013-01-22 DIAGNOSIS — R928 Other abnormal and inconclusive findings on diagnostic imaging of breast: Secondary | ICD-10-CM

## 2013-01-28 ENCOUNTER — Ambulatory Visit
Admission: RE | Admit: 2013-01-28 | Discharge: 2013-01-28 | Disposition: A | Discharge status: Home / Self Care | Payer: PRIVATE HEALTH INSURANCE | Source: Ambulatory Visit | Attending: Gynecology | Admitting: Gynecology

## 2013-01-28 DIAGNOSIS — R928 Other abnormal and inconclusive findings on diagnostic imaging of breast: Secondary | ICD-10-CM

## 2013-02-04 ENCOUNTER — Ambulatory Visit (INDEPENDENT_AMBULATORY_CARE_PROVIDER_SITE_OTHER): Payer: PRIVATE HEALTH INSURANCE | Admitting: Internal Medicine

## 2013-02-04 ENCOUNTER — Encounter: Payer: Self-pay | Admitting: Internal Medicine

## 2013-02-04 VITALS — BP 110/78 | HR 84 | Temp 98.7°F | Wt 146.0 lb

## 2013-02-04 DIAGNOSIS — H6590 Unspecified nonsuppurative otitis media, unspecified ear: Secondary | ICD-10-CM

## 2013-02-04 DIAGNOSIS — Z8739 Personal history of other diseases of the musculoskeletal system and connective tissue: Secondary | ICD-10-CM

## 2013-02-04 DIAGNOSIS — Z8669 Personal history of other diseases of the nervous system and sense organs: Secondary | ICD-10-CM

## 2013-02-04 DIAGNOSIS — H6593 Unspecified nonsuppurative otitis media, bilateral: Secondary | ICD-10-CM

## 2013-02-04 DIAGNOSIS — J309 Allergic rhinitis, unspecified: Secondary | ICD-10-CM

## 2013-02-04 DIAGNOSIS — Z96659 Presence of unspecified artificial knee joint: Secondary | ICD-10-CM

## 2013-02-04 DIAGNOSIS — D709 Neutropenia, unspecified: Secondary | ICD-10-CM

## 2013-02-04 DIAGNOSIS — R040 Epistaxis: Secondary | ICD-10-CM

## 2013-02-04 DIAGNOSIS — Z79899 Other long term (current) drug therapy: Secondary | ICD-10-CM

## 2013-02-04 DIAGNOSIS — Z96653 Presence of artificial knee joint, bilateral: Secondary | ICD-10-CM

## 2013-02-04 LAB — COMPREHENSIVE METABOLIC PANEL
Albumin: 4.3 g/dL (ref 3.5–5.2)
Alkaline Phosphatase: 53 U/L (ref 39–117)
CO2: 29 mEq/L (ref 19–32)
Calcium: 9.8 mg/dL (ref 8.4–10.5)
Chloride: 104 mEq/L (ref 96–112)
Glucose, Bld: 82 mg/dL (ref 70–99)
Potassium: 4.3 mEq/L (ref 3.5–5.3)
Sodium: 140 mEq/L (ref 135–145)
Total Protein: 6.7 g/dL (ref 6.0–8.3)

## 2013-02-04 LAB — CBC WITH DIFFERENTIAL/PLATELET
HCT: 39.3 % (ref 36.0–46.0)
Hemoglobin: 13.1 g/dL (ref 12.0–15.0)
Lymphocytes Relative: 39 % (ref 12–46)
Lymphs Abs: 1.8 10*3/uL (ref 0.7–4.0)
Monocytes Absolute: 0.5 10*3/uL (ref 0.1–1.0)
Monocytes Relative: 12 % (ref 3–12)
Neutro Abs: 2.1 10*3/uL (ref 1.7–7.7)
Neutrophils Relative %: 47 % (ref 43–77)
RBC: 4.24 MIL/uL (ref 3.87–5.11)

## 2013-02-04 MED ORDER — AZITHROMYCIN 250 MG PO TABS: ORAL_TABLET | ORAL | Status: DC

## 2013-02-04 MED ORDER — NEOMYCIN-POLYMYXIN-HC 1 % OT SOLN: 3.0000 [drp] | Freq: Four times a day (QID) | OTIC | Status: DC

## 2013-02-04 MED ORDER — ZOLMITRIPTAN 5 MG PO TABS: 5.0000 mg | ORAL_TABLET | ORAL | Status: DC | PRN

## 2013-02-04 MED ORDER — FLUCONAZOLE 150 MG PO TABS: 150.0000 mg | ORAL_TABLET | Freq: Once | ORAL | Status: DC

## 2013-02-04 NOTE — Patient Instructions (Addendum)
Take Zithromax Z Pak as directed. Take Diflucan  If needed.  Zomig has been refilled. Use Cortisporin Otic as directed. Appt with Dr. Lazarus Salines for nosebleed. Lab work drawn

## 2013-02-05 DIAGNOSIS — Z96653 Presence of artificial knee joint, bilateral: Secondary | ICD-10-CM | POA: Insufficient documentation

## 2013-02-05 DIAGNOSIS — Z8739 Personal history of other diseases of the musculoskeletal system and connective tissue: Secondary | ICD-10-CM | POA: Insufficient documentation

## 2013-02-05 NOTE — Progress Notes (Signed)
  Subjective:    Patient ID: [name], female    DOB: August 02, 1955, 57 y.o.   MRN: [id number]  HPI   57 year old White female who in today with right ear discomfort. She's also been having frequent right nosebleed almost weekly for some time. History of recurrent sinusitis. Has been swimming in [facility] where she and her husband have property. He she use some over-the-counter eardrops in says symptoms have improved a bit. History of migraine headaches treated with quarter tablet of Zomig 5 mg when necessary. This was refilled today. Also been following her CBC but calls she had low white blood cell count at one point in time and she's concerned because father with history of aplastic anemia. She also takes Celebrex on a regular basis and wants liver functions checked. History of CPPD arthritis. Has seen Dr. Corliss Skains in the past. History of bilateral knee replacements.    Review of Systems     Objective:   Physical Exam  right nostril has boggy nasal mucosa. No active bleeding noted. Both TMs are full but not red. Pharynx is clear. Neck is supple. Chest clear.        Assessment & Plan:  Bilateral serous otitis media  Probably had right external otitis last week which has improved  History of migraine headaches  History of CPPD arthritis treated with Celebrex  Right nostril nosebleed weekly  History of allergic rhinitis  Plan: Refer to Dr. Lazarus Salines for ENT evaluation of nosebleed. Treat with Cortisporin Otic suspension right external ear canal 2-4 times daily for 5-7 days. Zithromax Z-PAK take 2 tablets day one followed by 1 tablet days 2 through 5. Refill Zomig for one year. Diflucan prescribed if needed for Candida vaginitis with Zithromax

## 2013-02-11 ENCOUNTER — Telehealth: Payer: Self-pay

## 2013-02-11 NOTE — Telephone Encounter (Signed)
Left message for patient to call about a Tdap and a Flu vaccine in 2-4 weeks.

## 2013-02-20 ENCOUNTER — Ambulatory Visit (INDEPENDENT_AMBULATORY_CARE_PROVIDER_SITE_OTHER): Payer: PRIVATE HEALTH INSURANCE | Admitting: Internal Medicine

## 2013-02-20 DIAGNOSIS — Z23 Encounter for immunization: Secondary | ICD-10-CM

## 2013-02-20 MED ORDER — TETANUS-DIPHTH-ACELL PERTUSSIS 5-2.5-18.5 LF-MCG/0.5 IM SUSP: 0.5000 mL | Freq: Once | INTRAMUSCULAR | Status: DC

## 2013-03-28 ENCOUNTER — Other Ambulatory Visit: Payer: Self-pay | Admitting: Internal Medicine

## 2013-04-16 ENCOUNTER — Other Ambulatory Visit: Payer: Self-pay | Admitting: Internal Medicine

## 2013-04-29 ENCOUNTER — Other Ambulatory Visit: Payer: Self-pay | Admitting: Orthopedic Surgery

## 2013-04-29 NOTE — Progress Notes (Signed)
Preoperative surgical orders have been place into the Epic hospital system for [name] on 04/29/2013, 12:57 PM  by Patrica Duel for surgery on 05/14/2013.  Preop Knee Scope orders including IV Tylenol and IV Decadron as long as there are no contraindications to the above medications. [name], PA-C

## 2013-05-03 ENCOUNTER — Other Ambulatory Visit: Payer: Self-pay | Admitting: Internal Medicine

## 2013-05-05 ENCOUNTER — Encounter (HOSPITAL_COMMUNITY): Payer: Self-pay | Admitting: Pharmacy Technician

## 2013-05-07 NOTE — Patient Instructions (Addendum)
[name]  05/07/2013   Your procedure is scheduled on:  05/14/13  [facility]  Report to [facility] at  0800     AM.  Call this number if you have problems the morning of surgery: [phone]       Remember:   Do not eat food  Or drink :After Midnight. Tuesday NIGHT   Take these medicines the morning of surgery with A SIP OF WATER:NONE   .  Contacts, dentures or partial plates can not be worn to surgery  Leave suitcase in the car. After surgery it may be brought to your room.  For patients admitted to the hospital, checkout time is 11:00 AM day of  discharge.             SPECIAL INSTRUCTIONS- SEE [facility] PREPARING FOR SURGERY INSTRUCTION SHEET-     DO NOT WEAR JEWELRY, LOTIONS, POWDERS, OR PERFUMES.  WOMEN-- DO NOT SHAVE LEGS OR UNDERARMS FOR 12 HOURS BEFORE SHOWERS. MEN MAY SHAVE FACE.  Patients discharged the day of surgery will not be allowed to drive home. IF going home the day of surgery, you must have a driver and someone to stay with you for the first 24 hours  Name and phone number of your driver:   Husband  [name]                                                                     Please read over the following fact sheets that you were given: , Incentive Spirometry Sheet                                                                                  [name]  PST [phone]  [phone]                 FAILURE TO FOLLOW THESE INSTRUCTIONS MAY RESULT IN  CANCELLATION   OF YOUR SURGERY                                                  Patient Signature _____________________________

## 2013-05-08 ENCOUNTER — Encounter (HOSPITAL_COMMUNITY)
Admission: RE | Admit: 2013-05-08 | Discharge: 2013-05-08 | Disposition: A | Discharge status: Home / Self Care | Payer: PRIVATE HEALTH INSURANCE | Source: Ambulatory Visit | Attending: Orthopedic Surgery | Admitting: Orthopedic Surgery

## 2013-05-08 ENCOUNTER — Encounter (HOSPITAL_COMMUNITY): Payer: Self-pay

## 2013-05-08 DIAGNOSIS — Z01812 Encounter for preprocedural laboratory examination: Secondary | ICD-10-CM | POA: Insufficient documentation

## 2013-05-08 HISTORY — DX: Nausea with vomiting, unspecified: R11.2

## 2013-05-08 HISTORY — DX: Other specified postprocedural states: Z98.890

## 2013-05-08 HISTORY — DX: Other complications of anesthesia, initial encounter: T88.59XA

## 2013-05-08 HISTORY — DX: Adverse effect of unspecified anesthetic, initial encounter: T41.45XA

## 2013-05-08 NOTE — Progress Notes (Signed)
Patient states husband or friend will be driving her home.  Instructed to call Dr Salina April office for instructions regarding vitamins and aspirin- if needs to be stopped pre op.  Verbalized understanding

## 2013-05-13 DIAGNOSIS — M659 Synovitis and tenosynovitis, unspecified: Secondary | ICD-10-CM | POA: Diagnosis present

## 2013-05-13 NOTE — H&P (Signed)
  [name] is a 57 y.o. female who presents with right knee pain.  HPI- . Knee Pain: Patient presents with knee pain involving the  right knee. Onset of the symptoms was several months ago. Inciting event: none known. Current symptoms include crepitus sensation and foreign body sensation. Pain is aggravated by going up and down stairs and rising after sitting.  Patient has had prior knee problems. Evaluation to date: plain films: normal. 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 She presents for right knee arthroscopy and synovectomy.  Past Medical History  Diagnosis Date  . Allergy   . Arthritis   . Migraine   . Complication of anesthesia   . PONV (postoperative nausea and vomiting)     Past Surgical History  Procedure Laterality Date  . Cervical discectomy      C3-4  . Joint replacement Bilateral     knees  . Bunionectomy Left   . Eye surgery Bilateral     LASIK  . Knee arthroscopy Left     Prior to Admission medications   Medication Sig Start Date End Date Taking? Authorizing Provider  amitriptyline (ELAVIL) 10 MG tablet Take 10 mg by mouth at bedtime.    Historical Provider, MD  aspirin EC 81 MG tablet Take 81 mg by mouth every morning.    Historical Provider, MD  celecoxib (CELEBREX) 200 MG capsule Take 200 mg by mouth 2 (two) times daily.    Historical Provider, MD  Cholecalciferol (VITAMIN D) 2000 UNITS CAPS Take 2,000 Units by mouth daily.    Historical Provider, MD  lidocaine (LIDODERM) 5 % Place 1 patch onto the skin daily as needed (pain). Remove & Discard patch within 12 hours or as directed by MD    Historical Provider, MD  magnesium oxide (MAG-OX) 400 MG tablet Take 400 mg by mouth daily.    Historical Provider, MD  Melatonin 5 MG TABS Take 5 mg by mouth daily.    Historical Provider, MD  omega-3 acid ethyl esters (LOVAZA) 1 G capsule Take 1 g by mouth 4 (four) times daily.    Historical Provider, MD  zolmitriptan  (ZOMIG) 5 MG tablet Take 1.25 mg by mouth every 2 (two) hours as needed for migraine.    Historical Provider, MD  KNEE EXAM No effusion, collateral ligaments intact, ROM 0-125, crepitus on ROM  Physical Examination: General appearance - alert, well appearing, and in no distress Mental status - alert, oriented to person, place, and time Chest - clear to auscultation, no wheezes, rales or rhonchi, symmetric air entry Heart - normal rate, regular rhythm, normal S1, S2, no murmurs, rubs, clicks or gallops Abdomen - soft, nontender, nondistended, no masses or organomegaly Neurological - alert, oriented, normal speech, no focal findings or movement disorder noted   Asessment/Plan--- Right knee hypertrophic synovitis- - Plan right knee arthroscopy with synovectomy. Procedure risks and potential comps discussed with patient who elects to proceed. Goals are decreased pain and increased function with a high likelihood of achieving both

## 2013-05-14 ENCOUNTER — Ambulatory Visit (HOSPITAL_COMMUNITY): Payer: PRIVATE HEALTH INSURANCE | Admitting: Anesthesiology

## 2013-05-14 ENCOUNTER — Encounter (HOSPITAL_COMMUNITY)
Admission: RE | Disposition: A | Discharge status: Home / Self Care | Payer: Self-pay | Source: Ambulatory Visit | Attending: Orthopedic Surgery

## 2013-05-14 ENCOUNTER — Encounter (HOSPITAL_COMMUNITY): Payer: PRIVATE HEALTH INSURANCE | Admitting: Anesthesiology

## 2013-05-14 ENCOUNTER — Encounter (HOSPITAL_COMMUNITY): Payer: Self-pay | Admitting: *Deleted

## 2013-05-14 ENCOUNTER — Ambulatory Visit (HOSPITAL_COMMUNITY)
Admission: RE | Admit: 2013-05-14 | Discharge: 2013-05-14 | Disposition: A | Discharge status: Home / Self Care | Payer: PRIVATE HEALTH INSURANCE | Source: Ambulatory Visit | Attending: Orthopedic Surgery | Admitting: Orthopedic Surgery

## 2013-05-14 DIAGNOSIS — M659 Synovitis and tenosynovitis, unspecified: Secondary | ICD-10-CM | POA: Diagnosis present

## 2013-05-14 DIAGNOSIS — Z96659 Presence of unspecified artificial knee joint: Secondary | ICD-10-CM | POA: Insufficient documentation

## 2013-05-14 DIAGNOSIS — M6580 Other synovitis and tenosynovitis, unspecified site: Secondary | ICD-10-CM | POA: Insufficient documentation

## 2013-05-14 DIAGNOSIS — Z79899 Other long term (current) drug therapy: Secondary | ICD-10-CM | POA: Insufficient documentation

## 2013-05-14 DIAGNOSIS — Z7982 Long term (current) use of aspirin: Secondary | ICD-10-CM | POA: Insufficient documentation

## 2013-05-14 HISTORY — PX: KNEE ARTHROSCOPY: SHX127

## 2013-05-14 SURGERY — ARTHROSCOPY, KNEE
Anesthesia: General | Site: Knee | Laterality: Right

## 2013-05-14 MED ORDER — LIDOCAINE HCL (CARDIAC) 20 MG/ML IV SOLN
INTRAVENOUS | Status: AC
  Filled 2013-05-14: qty 5

## 2013-05-14 MED ORDER — FENTANYL CITRATE 0.05 MG/ML IJ SOLN
INTRAMUSCULAR | Status: DC | PRN
  Administered 2013-05-14: 50 ug via INTRAVENOUS

## 2013-05-14 MED ORDER — MIDAZOLAM HCL 5 MG/5ML IJ SOLN
INTRAMUSCULAR | Status: DC | PRN
  Administered 2013-05-14: 2 mg via INTRAVENOUS

## 2013-05-14 MED ORDER — CEFAZOLIN SODIUM-DEXTROSE 2-3 GM-% IV SOLR
2.0000 g | INTRAVENOUS | Status: AC
  Administered 2013-05-14: 2 g via INTRAVENOUS

## 2013-05-14 MED ORDER — HYDROCODONE-ACETAMINOPHEN 5-325 MG PO TABS: 1.0000 | ORAL_TABLET | Freq: Four times a day (QID) | ORAL | Status: DC | PRN

## 2013-05-14 MED ORDER — METHOCARBAMOL 500 MG PO TABS: 500.0000 mg | ORAL_TABLET | Freq: Four times a day (QID) | ORAL | Status: DC

## 2013-05-14 MED ORDER — PROPOFOL 10 MG/ML IV BOLUS
INTRAVENOUS | Status: DC | PRN
  Administered 2013-05-14: 160 mg via INTRAVENOUS

## 2013-05-14 MED ORDER — ACETAMINOPHEN 10 MG/ML IV SOLN
1000.0000 mg | Freq: Once | INTRAVENOUS | Status: AC
  Administered 2013-05-14: 1000 mg via INTRAVENOUS
  Filled 2013-05-14: qty 100

## 2013-05-14 MED ORDER — HYDROMORPHONE HCL PF 1 MG/ML IJ SOLN
INTRAMUSCULAR | Status: AC
  Filled 2013-05-14: qty 1

## 2013-05-14 MED ORDER — HYDROMORPHONE HCL PF 1 MG/ML IJ SOLN
0.2500 mg | INTRAMUSCULAR | Status: DC | PRN
  Administered 2013-05-14 (×4): 0.5 mg via INTRAVENOUS

## 2013-05-14 MED ORDER — ONDANSETRON HCL 4 MG/2ML IJ SOLN
INTRAMUSCULAR | Status: AC
  Filled 2013-05-14: qty 2

## 2013-05-14 MED ORDER — BUPIVACAINE-EPINEPHRINE 0.25% -1:200000 IJ SOLN
INTRAMUSCULAR | Status: DC | PRN
  Administered 2013-05-14: 20 mL

## 2013-05-14 MED ORDER — DEXAMETHASONE SODIUM PHOSPHATE 10 MG/ML IJ SOLN
INTRAMUSCULAR | Status: AC
  Filled 2013-05-14: qty 1

## 2013-05-14 MED ORDER — BUPIVACAINE-EPINEPHRINE PF 0.5-1:200000 % IJ SOLN
INTRAMUSCULAR | Status: AC
  Filled 2013-05-14: qty 30

## 2013-05-14 MED ORDER — LACTATED RINGERS IV SOLN
INTRAVENOUS | Status: DC
  Administered 2013-05-14: 1000 mL via INTRAVENOUS

## 2013-05-14 MED ORDER — DEXAMETHASONE SODIUM PHOSPHATE 10 MG/ML IJ SOLN
10.0000 mg | Freq: Once | INTRAMUSCULAR | Status: AC
  Administered 2013-05-14: 10 mg via INTRAVENOUS

## 2013-05-14 MED ORDER — SODIUM CHLORIDE 0.9 % IV SOLN: INTRAVENOUS | Status: DC

## 2013-05-14 MED ORDER — CEFAZOLIN SODIUM-DEXTROSE 2-3 GM-% IV SOLR
INTRAVENOUS | Status: AC
  Filled 2013-05-14: qty 50

## 2013-05-14 MED ORDER — LACTATED RINGERS IR SOLN
Status: DC | PRN
  Administered 2013-05-14 (×2): 6000 mL

## 2013-05-14 MED ORDER — MIDAZOLAM HCL 2 MG/2ML IJ SOLN
INTRAMUSCULAR | Status: AC
  Filled 2013-05-14: qty 2

## 2013-05-14 MED ORDER — LACTATED RINGERS IV SOLN: INTRAVENOUS | Status: DC

## 2013-05-14 MED ORDER — PROMETHAZINE HCL 25 MG/ML IJ SOLN: 6.2500 mg | INTRAMUSCULAR | Status: DC | PRN

## 2013-05-14 MED ORDER — ONDANSETRON HCL 4 MG/2ML IJ SOLN
INTRAMUSCULAR | Status: DC | PRN
  Administered 2013-05-14: 4 mg via INTRAVENOUS

## 2013-05-14 MED ORDER — LIDOCAINE HCL (CARDIAC) 20 MG/ML IV SOLN
INTRAVENOUS | Status: DC | PRN
  Administered 2013-05-14: 100 mg via INTRAVENOUS

## 2013-05-14 MED ORDER — FENTANYL CITRATE 0.05 MG/ML IJ SOLN
INTRAMUSCULAR | Status: AC
  Filled 2013-05-14: qty 2

## 2013-05-14 MED ORDER — PROPOFOL 10 MG/ML IV BOLUS
INTRAVENOUS | Status: AC
  Filled 2013-05-14: qty 20

## 2013-05-14 SURGICAL SUPPLY — 25 items
BLADE 4.2CUDA (BLADE) ×2 IMPLANT
CLOTH BEACON ORANGE TIMEOUT ST (SAFETY) ×2 IMPLANT
COUNTER NEEDLE 20 DBL MAG RED (NEEDLE) ×2 IMPLANT
CUFF TOURN SGL QUICK 34 (TOURNIQUET CUFF) ×1
CUFF TRNQT CYL 34X4X40X1 (TOURNIQUET CUFF) ×1 IMPLANT
DRAPE U-SHAPE 47X51 STRL (DRAPES) ×2 IMPLANT
DRSG EMULSION OIL 3X3 NADH (GAUZE/BANDAGES/DRESSINGS) ×2 IMPLANT
DURAPREP 26ML APPLICATOR (WOUND CARE) ×2 IMPLANT
GLOVE BIO SURGEON STRL SZ8 (GLOVE) ×2 IMPLANT
GLOVE BIOGEL PI IND STRL 8 (GLOVE) ×1 IMPLANT
GLOVE BIOGEL PI INDICATOR 8 (GLOVE) ×1
GOWN PREVENTION PLUS LG XLONG (DISPOSABLE) ×2 IMPLANT
MANIFOLD NEPTUNE II (INSTRUMENTS) ×2 IMPLANT
PACK ARTHROSCOPY WL (CUSTOM PROCEDURE TRAY) ×2 IMPLANT
PACK ICE MAXI GEL EZY WRAP (MISCELLANEOUS) ×6 IMPLANT
PADDING CAST COTTON 6X4 STRL (CAST SUPPLIES) ×2 IMPLANT
POSITIONER SURGICAL ARM (MISCELLANEOUS) ×2 IMPLANT
SET ARTHROSCOPY TUBING (MISCELLANEOUS) ×2
SET ARTHROSCOPY TUBING LN (MISCELLANEOUS) ×1 IMPLANT
SUT ETHILON 4 0 PS 2 18 (SUTURE) ×2 IMPLANT
SYR 20CC LL (SYRINGE) ×2 IMPLANT
TOWEL OR 17X26 10 PK STRL BLUE (TOWEL DISPOSABLE) ×2 IMPLANT
WAND 90 DEG TURBOVAC W/CORD (SURGICAL WAND) ×2 IMPLANT
WATER STERILE IRR 500ML POUR (IV SOLUTION) ×2 IMPLANT
WRAP KNEE MAXI GEL POST OP (GAUZE/BANDAGES/DRESSINGS) ×2 IMPLANT

## 2013-05-14 NOTE — Preoperative (Signed)
Beta Blockers   Reason not to administer Beta Blockers:Not Applicable 

## 2013-05-14 NOTE — Interval H&P Note (Signed)
History and Physical Interval Note:  05/14/2013 9:51 AM  [name]  has presented today for surgery, with the diagnosis of RIGHT KNEE HYPERTROPHY SYNOVITIS  The various methods of treatment have been discussed with the patient and family. After consideration of risks, benefits and other options for treatment, the patient has consented to  Procedure(s): RIGHT ARTHROSCOPY KNEE (Right) as a surgical intervention .  The patient's history has been reviewed, patient examined, no change in status, stable for surgery.  I have reviewed the patient's chart and labs.  Questions were answered to the patient's satisfaction.     Loanne Drilling

## 2013-05-14 NOTE — Brief Op Note (Signed)
05/14/2013  10:58 AM  PATIENT:  [name]  57 y.o. female  PRE-OPERATIVE DIAGNOSIS:  RIGHT KNEE HYPERTROPHIC SYNOVITIS  POST-OPERATIVE DIAGNOSIS:  RIGHT KNEE HYPERTROPHIC SYNOVITIS  PROCEDURE:  Procedure(s): RIGHT ARTHROSCOPY KNEE (Right) WITH SYNOVECTOMY   SURGEON:  Surgeon(s) and Role:    * Loanne Drilling, MD - Primary  PHYSICIAN ASSISTANT:   ASSISTANTS: none   ANESTHESIA:   general  EBL:  Total I/O In: 700 [I.V.:700] Out: -   BLOOD ADMINISTERED:none  LOCAL MEDICATIONS USED:  MARCAINE      COUNTS:  YES  TOURNIQUET:  * No tourniquets in log *  DICTATION: .Other Dictation: Dictation Number [id number]  PLAN OF CARE: Discharge to home after PACU  PATIENT DISPOSITION:  PACU - hemodynamically stable.

## 2013-05-14 NOTE — Anesthesia Postprocedure Evaluation (Signed)
Anesthesia Post Note  Patient: [name]  Procedure(s) Performed: Procedure(s) (LRB): RIGHT ARTHROSCOPY KNEE (Right)  Anesthesia type: General  Patient location: PACU  Post pain: Pain level controlled  Post assessment: Post-op Vital signs reviewed  Last Vitals:  Filed Vitals:   05/14/13 1326  BP:   Pulse: 68  Temp:   Resp: 16    Post vital signs: Reviewed  Level of consciousness: sedated  Complications: No apparent anesthesia complications

## 2013-05-14 NOTE — Transfer of Care (Signed)
Immediate Anesthesia Transfer of Care Note  Patient: [name]  Procedure(s) Performed: Procedure(s): RIGHT ARTHROSCOPY KNEE (Right)  Patient Location: PACU  Anesthesia Type:General  Level of Consciousness: sedated  Airway & Oxygen Therapy: Patient Spontanous Breathing and Patient connected to face mask oxygen  Post-op Assessment: Report given to PACU RN and Post -op Vital signs reviewed and stable  Post vital signs: Reviewed and stable  Complications: No apparent anesthesia complications

## 2013-05-14 NOTE — Anesthesia Preprocedure Evaluation (Addendum)
Anesthesia Evaluation  Patient identified by MRN, date of birth, ID band Patient awake    Reviewed: Allergy & Precautions, H&P , NPO status , Patient's Chart, lab work & pertinent test results  History of Anesthesia Complications (+) PONV  Airway Mallampati: II TM Distance: >3 FB Neck ROM: Full    Dental  (+) Teeth Intact and Dental Advisory Given   Pulmonary neg pulmonary ROS,  breath sounds clear to auscultation  Pulmonary exam normal       Cardiovascular negative cardio ROS  Rhythm:Regular Rate:Normal     Neuro/Psych  Headaches,  Neuromuscular disease negative neurological ROS  negative psych ROS   GI/Hepatic negative GI ROS, Neg liver ROS,   Endo/Other  negative endocrine ROS  Renal/GU negative Renal ROS  negative genitourinary   Musculoskeletal  (+) Arthritis -,   Abdominal   Peds  Hematology negative hematology ROS (+)   Anesthesia Other Findings   Reproductive/Obstetrics                         Anesthesia Physical Anesthesia Plan  ASA: II  Anesthesia Plan: General   Post-op Pain Management:    Induction: Intravenous  Airway Management Planned: LMA  Additional Equipment:   Intra-op Plan:   Post-operative Plan: Extubation in OR  Informed Consent: I have reviewed the patients History and Physical, chart, labs and discussed the procedure including the risks, benefits and alternatives for the proposed anesthesia with the patient or authorized representative who has indicated his/her understanding and acceptance.   Dental advisory given  Plan Discussed with: CRNA  Anesthesia Plan Comments: (Zofran/Decadron)       Anesthesia Quick Evaluation

## 2013-05-15 ENCOUNTER — Encounter (HOSPITAL_COMMUNITY): Payer: Self-pay | Admitting: Orthopedic Surgery

## 2013-05-15 NOTE — Op Note (Signed)
NAMESTEVANA, [name]             ACCOUNT NO.:  [id number]  MEDICAL RECORD NO.:  [id number]  LOCATION:  [facility]                         FACILITY:  [facility]  PHYSICIAN:  Ollen Gross, M.D.    DATE OF BIRTH:  1955-09-06  DATE OF PROCEDURE:  05/14/2013 DATE OF DISCHARGE:  05/14/2013                              OPERATIVE REPORT   PREOPERATIVE DIAGNOSIS:  Right knee hypertrophic synovitis.  POSTOPERATIVE DIAGNOSIS:  Right knee hypertrophic synovitis.  PROCEDURE:  Right knee arthroscopy with synovectomy.  SURGEON:  Ollen Gross, M.D.  ASSISTANT:  No assistant.  ANESTHESIA:  General.  ESTIMATED BLOOD LOSS:  Minimal.  DRAIN:  None.  COMPLICATIONS:  None.  CONDITION:  Stable to recovery.  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  She presents now for right knee arthroscopy with synovectomy.  PROCEDURE IN DETAIL:  After successful administration of general anesthetic, the tourniquet was placed on her right thigh.  Right lower extremity was prepped and draped in the usual sterile fashion.  Standard superomedial, inferolateral incisions were made.  Inflow cannula passed superomedial and camera passed inferolateral.  Arthroscopic visualization proceeds.  There was a large amount of hypertrophic tissue in the suprapatellar pouch at the junction of the quadriceps tendon and the patellar component.  This was essentially obliterated the suprapatellar pouch.  She also had a lot of hypertrophic tissue in the lateral gutter.  I created the superolateral portal with an 11 blade and took sample of the synovium to send for pathologic specimen to rule out CPTD as she has a history of this disorder.  I then used a combination of the shaver and the ArthroCare device to remove the hypertrophic synovium from the joint.  We cleaned out the suprapatellar pouch,  the lateral gutter and on the medial side, the tissue looked normal.  There was also a small amount of infra-patellar hypertrophic tissue, which was removed.  I inspected the joint again, and the suprapatellar pouch and lateral and medial gutter are clear.  Joints again inspected and hemostasis was achieved.  The arthroscopic equipments were removed from the lateral portals, which were closed with interrupted 4-0 nylon.  A 20 mL of 0.25% Marcaine with epinephrine injected through the inflow cannula that was removed and portal closed with nylon.  The incision was then cleaned and dried.  A bulky sterile dressing applied.  She was subsequently awakened and transported to recovery in stable condition.     Ollen Gross, M.D.     FA/MEDQ  D:  05/14/2013  T:  05/15/2013  Job:  [id number]

## 2013-07-20 ENCOUNTER — Emergency Department (HOSPITAL_COMMUNITY): Payer: PRIVATE HEALTH INSURANCE

## 2013-07-20 ENCOUNTER — Encounter (HOSPITAL_COMMUNITY): Payer: Self-pay | Admitting: Emergency Medicine

## 2013-07-20 ENCOUNTER — Emergency Department (HOSPITAL_COMMUNITY)
Admission: EM | Admit: 2013-07-20 | Discharge: 2013-07-20 | Disposition: A | Discharge status: Home / Self Care | Payer: PRIVATE HEALTH INSURANCE | Attending: Emergency Medicine | Admitting: Emergency Medicine

## 2013-07-20 DIAGNOSIS — Z7982 Long term (current) use of aspirin: Secondary | ICD-10-CM | POA: Insufficient documentation

## 2013-07-20 DIAGNOSIS — Z791 Long term (current) use of non-steroidal anti-inflammatories (NSAID): Secondary | ICD-10-CM | POA: Insufficient documentation

## 2013-07-20 DIAGNOSIS — Z79899 Other long term (current) drug therapy: Secondary | ICD-10-CM | POA: Insufficient documentation

## 2013-07-20 DIAGNOSIS — M79602 Pain in left arm: Secondary | ICD-10-CM

## 2013-07-20 DIAGNOSIS — M79609 Pain in unspecified limb: Secondary | ICD-10-CM | POA: Insufficient documentation

## 2013-07-20 LAB — CBC
HCT: 37.7 % (ref 36.0–46.0)
HEMOGLOBIN: 12.9 g/dL (ref 12.0–15.0)
MCH: 30.9 pg (ref 26.0–34.0)
MCHC: 34.2 g/dL (ref 30.0–36.0)
MCV: 90.2 fL (ref 78.0–100.0)
Platelets: 265 10*3/uL (ref 150–400)
RBC: 4.18 MIL/uL (ref 3.87–5.11)
RDW: 13.3 % (ref 11.5–15.5)
WBC: 3.9 10*3/uL — AB (ref 4.0–10.5)

## 2013-07-20 LAB — BASIC METABOLIC PANEL
BUN: 20 mg/dL (ref 6–23)
CO2: 25 meq/L (ref 19–32)
Calcium: 9.4 mg/dL (ref 8.4–10.5)
Chloride: 106 mEq/L (ref 96–112)
Creatinine, Ser: 0.87 mg/dL (ref 0.50–1.10)
GFR calc Af Amer: 85 mL/min — ABNORMAL LOW (ref >90)
GFR, EST NON AFRICAN AMERICAN: 73 mL/min — AB (ref >90)
GLUCOSE: 110 mg/dL — AB (ref 70–99)
POTASSIUM: 4.2 meq/L (ref 3.7–5.3)
SODIUM: 143 meq/L (ref 137–147)

## 2013-07-20 LAB — POCT I-STAT TROPONIN I: Troponin i, poc: 0.01 ng/mL (ref 0.00–0.08)

## 2013-07-20 MED ORDER — NITROGLYCERIN 0.4 MG SL SUBL: 0.4000 mg | SUBLINGUAL_TABLET | SUBLINGUAL | Status: DC | PRN

## 2013-07-20 MED ORDER — ASPIRIN 81 MG PO CHEW
324.0000 mg | CHEWABLE_TABLET | Freq: Once | ORAL | Status: AC
  Administered 2013-07-20: 324 mg via ORAL
  Filled 2013-07-20: qty 4

## 2013-07-20 NOTE — Discharge Instructions (Signed)
Heart Attack in Women  Heart attack (myocardial infarction) is one of the leading causes of sudden, unexpected death in women. A heart attack is damage to the heart that is not reversible. A heart attack usually occurs when a heart (coronary) artery becomes narrowed or blocked. The blockage cuts off blood supply to the heart muscle. When one or more of the coronary arteries becomes blocked, that area of the heart begins to die. This can cause pain felt during a heart attack.   If you think you might be having a heart attack, do not ignore your symptoms. Call your local emergency services (911 in [place] immediately. It is recommended that you take a 162 mg non-enteric coated aspirin if you do not have an aspirin allergy. Do not drive yourself to the hospital or wait to see if your symptoms go away. Early recognition of heart attack symptoms is critical. The sooner a heart attack is treated, the greater the amount of heart muscle saved. Time is muscle. It can save your life.  CAUSES   A heart attack can occur from coronary artery disease (CAD). CAD is a process in which the coronary arteries narrow or become blocked from the development of atherosclerosis. Atherosclerosis is a disease in which plaque builds up on the inside of the coronary arteries. Plaque is made up of fats (lipids), cholesterol, calcium, and fibrous tissue. A heart attack can occur due to:  · Plaque buildup that can severely narrow or block the coronary arteries and diminish blood flow.  · Plaque that can become unstable and "rupture." Unstable plaque that ruptures within a coronary artery can form a clot and cause a sudden (acute) blockage of the coronary artery.  · A severe tightening (spasm) of the coronary artery. This is a less common cause of a heart attack. When a coronary artery spasms, it cuts off blood flow through the coronary artery. Spasms can occur in coronary arteries that do not have atherosclerosis.  RISK FACTORS  In women, as the  level of estrogen in the blood decreases after menopause, the risk of a heart attack increases. Other risk factors of heart attack in women include:  · High blood pressure.  · High cholesterol levels.  · Menopause.  · Smoking.  · Obesity.  · Diabetes.  · Hysterectomy.  · Previous heart attack.  · Lack of regular exercise.  · Family history of heart attacks.  SYMPTOMS   In women, heart attack symptoms may be different than those in men. Women may not experience the typical chest discomfort or pain, which is considered the primary heart attack symptom in men. Women may describe a feeling of pressure, ache, or tightness in the chest. Women may experience new or different physical symptoms 1 month or more before a heart attack. Unusual, unexplained fatigue may be the most frequently identified symptom. Sleep disturbances and weakness in the arms may also be considered warning signs.   Other heart attack symptoms that may occur more often in women are:  · Unexplained feelings of nervousness or anxiety.  · Discomfort between the shoulder blades.  · Tingling in the hands and arms.  · Swollen arms.  · Headaches.  Heart attack symptoms for both men and women include:  · Pain or discomfort spreading to the neck, shoulder, arm, or jaw.  · Shortness of breath.  · Sudden cold sweats.  · Pain or discomfort in the abdomen.  · Heartburn or indigestion with or without vomiting.  · Sudden lightheadedness.  ·   Sudden fainting or blackout.  PREVENTION  The following healthy lifestyle habits may help decrease your risk of heart attacks:  · Quitting smoking.  · Keeping your blood pressure, blood sugar, and cholesterol levels within normal limits.  · Maintaining a healthy weight.  · Staying physically active and exercising regularly.  · Decreasing your salt intake.  · Eating a diet low in saturated fats and cholesterol.  · Increasing your fiber intake by including whole grains, vegetables, and fruits in your diet.  · Avoiding situations  that cause stress, anger, or depression.  · Taking medicine as advised by your caregiver.  SEEK IMMEDIATE MEDICAL CARE IF:   · You have severe chest pain, especially if the pain is crushing or pressure-like and spreads to the arms, back, neck, or jaw. This is an emergency. Do not wait to see if the pain will go away. Call your local emergency services (911 in [place] immediately. Do not drive yourself to the hospital.  · You develop shortness of breath during rest, sleep, or with activity.  · You have sudden, unexplained sweating or clammy skin.  · You feel nauseous or vomit without cause.  · You become lightheaded or dizzy.  · You feel your heart beating rapidly or you notice your heart "skipping" beats.  MAKE SURE YOU:   · Understand these instructions.  · Will watch your condition.  · Will get help right away if you are not doing well or get worse.  Document Released: 11/18/2007 Document Revised: 11/21/2011 Document Reviewed: 08/24/2011  [employer]® Patient Information ©2014 [employer].

## 2013-07-20 NOTE — ED Notes (Signed)
Pt c/o left arm pain onset this morning when she woke. Pt denies recent injury. Pt reports nausea this morning. Denies shortness of breath or diaphoresis.

## 2013-07-20 NOTE — ED Provider Notes (Signed)
CSN: [id number]     Arrival date & time 07/20/13  1427 History   First MD Initiated Contact with Patient 07/20/13 1522     Chief Complaint  Patient presents with  . Arm Pain    Left arm     (Consider location/radiation/quality/duration/timing/severity/associated sxs/prior Treatment) Patient is a 58 y.o. female presenting with arm pain.  Arm Pain    Pt with no significant medical history reports she woke up this AM with a moderate aching pain in L upper arm, lasted for about 2 hours, resolved spontaneously, not associated with CP, SOB, diaphoresis. She did not have any numbness or tingling in hand. No neck or shoulder pain. Not worse with movement. She had two additional episodes later in the morning and then a short time prior to arrival. Both resolved spontaneously, currently pain free. No cardiac risk factors, no HTN, DM, HLD. No smoking or drug use.   Past Medical History  Diagnosis Date  . Allergy   . Arthritis   . Migraine   . Complication of anesthesia   . PONV (postoperative nausea and vomiting)    Past Surgical History  Procedure Laterality Date  . Cervical discectomy      C3-4  . Joint replacement Bilateral     knees  . Bunionectomy Left   . Eye surgery Bilateral     LASIK  . Knee arthroscopy Left   . Knee arthroscopy Right 05/14/2013    Procedure: RIGHT ARTHROSCOPY KNEE;  Surgeon: Gearlean Alf, MD;  Location: [facility];  Service: Orthopedics;  Laterality: Right;   Family History  Problem Relation Age of Onset  . Heart disease Mother    History  Substance Use Topics  . Smoking status: Never Smoker   . Smokeless tobacco: Never Used  . Alcohol Use: Yes     Comment: socially   OB History   Grav Para Term Preterm Abortions TAB SAB Ect Mult Living                 Review of Systems All other systems reviewed and are negative except as noted in HPI.     Allergies  Review of patient's allergies indicates no known allergies.  Home Medications   Current  Outpatient Rx  Name  Route  Sig  Dispense  Refill  . amitriptyline (ELAVIL) 10 MG tablet   Oral   Take 10 mg by mouth at bedtime.         Marland Kitchen aspirin EC 81 MG tablet   Oral   Take 81 mg by mouth every morning.         . celecoxib (CELEBREX) 200 MG capsule   Oral   Take 200 mg by mouth 2 (two) times daily.         . Cholecalciferol (VITAMIN D) 2000 UNITS CAPS   Oral   Take 2,000 Units by mouth daily.         Marland Kitchen HYDROcodone-acetaminophen (NORCO) 5-325 MG per tablet   Oral   Take 1-2 tablets by mouth every 6 (six) hours as needed for moderate pain.   50 tablet   0   . lidocaine (LIDODERM) 5 %   Transdermal   Place 1 patch onto the skin daily as needed (pain). Remove & Discard patch within 12 hours or as directed by MD         . magnesium oxide (MAG-OX) 400 MG tablet   Oral   Take 400 mg by mouth daily.         Marland Kitchen  Melatonin 5 MG TABS   Oral   Take 5 mg by mouth daily.         . methocarbamol (ROBAXIN) 500 MG tablet   Oral   Take 1 tablet (500 mg total) by mouth 4 (four) times daily. As needed for muscle spasm   30 tablet   1   . omega-3 acid ethyl esters (LOVAZA) 1 G capsule   Oral   Take 1 g by mouth 4 (four) times daily.         Marland Kitchen zolmitriptan (ZOMIG) 5 MG tablet   Oral   Take 1.25 mg by mouth every 2 (two) hours as needed for migraine.          BP 122/46  Pulse 81  Temp(Src) 98.1 F (36.7 C) (Oral)  Resp 17  Wt 150 lb (68.04 kg)  SpO2 99% Physical Exam  Nursing note and vitals reviewed. Constitutional: She is oriented to person, place, and time. She appears well-developed and well-nourished.  HENT:  Head: Normocephalic and atraumatic.  Eyes: EOM are normal. Pupils are equal, round, and reactive to light.  Neck: Normal range of motion. Neck supple.  Cardiovascular: Normal rate, normal heart sounds and intact distal pulses.   Pulmonary/Chest: Effort normal and breath sounds normal.  Abdominal: Bowel sounds are normal. She exhibits no  distension. There is no tenderness.  Musculoskeletal: Normal range of motion. She exhibits no edema and no tenderness.  Neurological: She is alert and oriented to person, place, and time. She has normal strength. No cranial nerve deficit or sensory deficit.  Skin: Skin is warm and dry. No rash noted.  Psychiatric: She has a normal mood and affect.    ED Course  Procedures (including critical care time) Labs Review Labs Reviewed  CBC - Abnormal; Notable for the following:    WBC 3.9 (*)    All other components within normal limits  BASIC METABOLIC PANEL - Abnormal; Notable for the following:    Glucose, Bld 110 (*)    GFR calc non Af Amer 73 (*)    GFR calc Af Amer 85 (*)    All other components within normal limits  POCT I-STAT TROPONIN I   Imaging Review Dg Chest 2 View  07/20/2013   CLINICAL DATA:  Left anterior arm pain. Possible anginal equivalent.  EXAM: CHEST 2 VIEW  COMPARISON:  No priors.  FINDINGS: Lung volumes are normal. No consolidative airspace disease. No pleural effusions. No pneumothorax. No pulmonary nodule or mass noted. Pulmonary vasculature and the cardiomediastinal silhouette are within normal limits.  IMPRESSION: 1.  No radiographic evidence of acute cardiopulmonary disease.   Electronically Signed   By: Vinnie Langton M.D.   On: 07/20/2013 16:38    EKG Interpretation    Date/Time:  Sunday July 20 2013 14:29:32 EST Ventricular Rate:  86 PR Interval:  140 QRS Duration: 86 QT Interval:  370 QTC Calculation: 442 R Axis:   95 Text Interpretation:  Normal sinus rhythm Biatrial enlargement Rightward axis Pulmonary disease pattern Abnormal ECG No old tracing to compare Confirmed by [facility]  MD, CHARLES [phone]) on 07/20/2013 3:22:53 PM            MDM   Final diagnoses:  Left arm pain    Labs and imaging here are unremarkable. Pt is low risk for CAD, doubt this is ACS but no clear cause of her symptoms. Will d/c with close PCP followup. NTG Rx given  if needed. Advised to return to the ER  if symptoms worsen or for any other concerns.     Charles B. Karle Starch, MD 07/20/13 [id number]

## 2013-07-25 ENCOUNTER — Ambulatory Visit (INDEPENDENT_AMBULATORY_CARE_PROVIDER_SITE_OTHER): Payer: PRIVATE HEALTH INSURANCE | Admitting: Internal Medicine

## 2013-07-25 ENCOUNTER — Encounter: Payer: Self-pay | Admitting: Internal Medicine

## 2013-07-25 VITALS — BP 124/88 | HR 80 | Temp 99.0°F | Wt 153.5 lb

## 2013-07-25 DIAGNOSIS — M79609 Pain in unspecified limb: Secondary | ICD-10-CM

## 2013-07-25 DIAGNOSIS — M542 Cervicalgia: Secondary | ICD-10-CM

## 2013-07-25 DIAGNOSIS — M79602 Pain in left arm: Secondary | ICD-10-CM

## 2013-07-25 NOTE — Progress Notes (Signed)
   Subjective:    Patient ID: [name], female    DOB: 03/23/1956, 58 y.o.   MRN: [id number]  HPI Patient went to the emergency department on 07/20/2013 with moderate aching left upper arm lasting for 2 hours associated with some slight nausea. No chest pain. No diaphoresis. No radiation of this pain elsewhere. No neck pain at the time. Apparently had a couple of additional episodes in the late morning. Went to the emergency department shortly after 2 PM. She has no cardiac risk factors. Does not smoke. History of allergic rhinitis and migraine headaches.  In the emergency department chest x-ray was negative. White blood cell count was 3900. We have been following her for slightly low white blood cell count. She's had pathology review of smear which was negative. Glucose was 110. EKG was performed and showed no acute changes. By the time patient got to the emergency department, her arm pain had resolved. She denied any injury or fall. Was not short of breath.  Patient seen here today for followup. Says that she noticed earlier this week some left arm pain which seemed to be positional in nature from sleeping.  Also says that from time to time she's had some unusual feeling in her neck lasting 60 seconds generally at rest where she feels as if she's eaten some very cold ice cream. Says this is been going on intermittently for 2 years. Once this happened while driving. She has a history of calcium pyrophosphate deposition disease. She takes Celebrex for musculoskeletal pain. Takes Elavil for migraine headache prevention and when necessary Zomig for migraines. She does take aspirin 81 mg daily.    Review of Systems     Objective:   Physical Exam  Spent 15 minutes speaking with patient about this issue. We have agreed that she will pursue cardiac evaluation      Assessment & Plan:  Left arm pain  Intermittent neck pain  Calcium pyrophosphate deposition disease  Migraine  headaches  Allergic rhinitis  Plan: Arrange for cardiology evaluation

## 2013-07-25 NOTE — Patient Instructions (Signed)
We will arrange Cardiology evaluation in the near future.

## 2013-07-29 ENCOUNTER — Institutional Professional Consult (permissible substitution): Payer: PRIVATE HEALTH INSURANCE | Admitting: Cardiology

## 2013-07-29 ENCOUNTER — Telehealth: Payer: Self-pay

## 2013-07-29 DIAGNOSIS — R079 Chest pain, unspecified: Secondary | ICD-10-CM

## 2013-07-30 NOTE — Telephone Encounter (Signed)
Patient is scheduled to see Dr. Acie Fredrickson on 08/21/2013 at 8:45 am. Patient aware.

## 2013-08-21 ENCOUNTER — Institutional Professional Consult (permissible substitution): Payer: PRIVATE HEALTH INSURANCE | Admitting: Cardiovascular Disease

## 2013-09-05 ENCOUNTER — Ambulatory Visit (INDEPENDENT_AMBULATORY_CARE_PROVIDER_SITE_OTHER): Payer: PRIVATE HEALTH INSURANCE | Admitting: Cardiovascular Disease

## 2013-09-05 ENCOUNTER — Encounter: Payer: Self-pay | Admitting: Cardiovascular Disease

## 2013-09-05 VITALS — BP 122/73 | HR 77 | Ht 65.0 in | Wt 152.8 lb

## 2013-09-05 DIAGNOSIS — M79609 Pain in unspecified limb: Secondary | ICD-10-CM

## 2013-09-05 DIAGNOSIS — M79602 Pain in left arm: Secondary | ICD-10-CM | POA: Insufficient documentation

## 2013-09-05 NOTE — Progress Notes (Signed)
[name] Date of Birth  June 10, 1955       [facility]    [employer] [address], [place], [place]  [address]   [place], [place]  [address] [place]   Fax  [phone]     Fax [phone]  Problem List: 1. Left upper arm pain 2. Migraine headaches 3. Cervical spine fusion  History of Present Illness:  [name] is a 58 yo with an episode of left upper arm pain about 1 month ago.  She woke up with this pain.  She had recurrent episodes 2 more times.  She initially thought that she had slept on it wrong.   She has had both knees replaced (2 years ago)and is not walking much.  Prior to that she has not been able to do much.  Works out with a Clinical research associate.   No issues with cp or left arm pain with her workouts.   She also has some neck pain / throat pain - like a brain freeze.  Not associated with any eating or drinking.  She also has hand numbness when she wakes up .   Seems to be positional - is relieved when she moves her arms.    Current Outpatient Prescriptions on File Prior to Visit  Medication Sig Dispense Refill  . amitriptyline (ELAVIL) 10 MG tablet Take 15 mg by mouth at bedtime.       Marland Kitchen aspirin EC 81 MG tablet Take 81 mg by mouth every morning.      . celecoxib (CELEBREX) 200 MG capsule Take 200 mg by mouth 2 (two) times daily. Patient is trying to take just one in the morning (09/05/13)      . Cholecalciferol (VITAMIN D) 2000 UNITS CAPS Take 2,000 Units by mouth daily.      . magnesium oxide (MAG-OX) 400 MG tablet Take 400 mg by mouth daily.      . Melatonin 5 MG TABS Take 5 mg by mouth daily.      . nitroGLYCERIN (NITROSTAT) 0.4 MG SL tablet Place 1 tablet (0.4 mg total) under the tongue every 5 (five) minutes as needed (for chest or arm pain).  30 tablet  0  . omega-3 acid ethyl esters (LOVAZA) 1 G capsule Take 1 g by mouth 2 (two) times daily.       Marland Kitchen zolmitriptan (ZOMIG) 5 MG tablet Take  1.25 mg by mouth as needed for migraine.        Current Facility-Administered Medications on File Prior to Visit  Medication Dose Route Frequency Provider Last Rate Last Dose  . TDaP (BOOSTRIX) injection 0.5 mL  0.5 mL Intramuscular Once Elby Showers, MD        No Known Allergies  Past Medical History  Diagnosis Date  . Allergy   . Arthritis   . Migraine   . Complication of anesthesia   . PONV (postoperative nausea and vomiting)     Past Surgical History  Procedure Laterality Date  . Cervical discectomy      C3-4  . Joint replacement Bilateral     knees  . Bunionectomy Left   . Eye surgery Bilateral     LASIK  . Knee arthroscopy Left   . Knee arthroscopy Right 05/14/2013    Procedure: RIGHT ARTHROSCOPY KNEE;  Surgeon: Gearlean Alf, MD;  Location: [facility];  Service: Orthopedics;  Laterality: Right;    History  Smoking status  . Never Smoker   Smokeless tobacco  . Never Used    History  Alcohol Use  . Yes    Comment: socially    Family History  Problem Relation Age of Onset  . Heart disease Mother     Reviw of Systems:  Reviewed in the HPI.  All other systems are negative.  Physical Exam: Blood pressure 122/73, pulse 77, height 5\' 5"  (1.651 m), weight 152 lb 12.8 oz (69.31 kg). Wt Readings from Last 3 Encounters:  09/05/13 152 lb 12.8 oz (69.31 kg)  07/25/13 153 lb 8 oz (69.627 kg)  07/20/13 150 lb (68.04 kg)   General: Well developed, well nourished, in no acute distress.  Head: Normocephalic, atraumatic, sclera non-icteric, mucus membranes are moist,   Neck: Supple. Carotids are 2 + without bruits. No JVD   Lungs: Clear   Heart: RR, normal S1S2, no chest wall pain.    Abdomen: Soft, + palpable midline mass - c/w abdominal aorta  Msk:  Strength and tone are normal   Extremities: No clubbing or cyanosis. No edema.  Distal pedal pulses are 2+ and equal   Neuro: CN II - XII intact.  Alert and oriented X 3.   Psych:  Normal   ECG: Feb.  15, 2015:  NSR at 36. Normal.   Assessment / Plan:

## 2013-09-05 NOTE — Patient Instructions (Signed)
Your physician recommends that you schedule a follow-up appointment in: as needed basis   Your physician recommends that you continue on your current medications as directed. Please refer to the Current Medication list given to you today.   

## 2013-09-05 NOTE — Assessment & Plan Note (Addendum)
[name]  presents today for further evaluation of an episode of left arm pain. The pain is quite atypical and I do not think that it is cardiac. Her enzymes were negative. She's not had any further episodes.  She is limited by bilateral knee replacements but we discussed starting a regular exercise program. She's looking forward to starting a walking program or perhaps working out on a stationary bike.  At this point I do not think that she needs any additional workup. She would not be able to her regular  treadmill test because of her knee replacements. . I don't think  that we need to pursue a stress or Adenosine Myoview.  I will see her back on an as needed basis.

## 2013-12-14 ENCOUNTER — Other Ambulatory Visit: Payer: Self-pay | Admitting: Internal Medicine

## 2013-12-26 ENCOUNTER — Other Ambulatory Visit: Payer: Self-pay

## 2013-12-26 DIAGNOSIS — Z1231 Encounter for screening mammogram for malignant neoplasm of breast: Secondary | ICD-10-CM

## 2014-01-14 ENCOUNTER — Other Ambulatory Visit: Payer: Self-pay | Admitting: Gynecology

## 2014-01-15 LAB — CYTOLOGY - PAP

## 2014-01-22 ENCOUNTER — Ambulatory Visit
Admission: RE | Admit: 2014-01-22 | Discharge: 2014-01-22 | Disposition: A | Discharge status: Home / Self Care | Payer: PRIVATE HEALTH INSURANCE | Source: Ambulatory Visit

## 2014-01-22 ENCOUNTER — Other Ambulatory Visit: Payer: Self-pay

## 2014-01-22 DIAGNOSIS — Z1231 Encounter for screening mammogram for malignant neoplasm of breast: Secondary | ICD-10-CM

## 2014-03-25 ENCOUNTER — Telehealth: Payer: Self-pay | Admitting: Internal Medicine

## 2014-03-25 NOTE — Telephone Encounter (Signed)
Written as requested

## 2014-03-25 NOTE — Telephone Encounter (Signed)
Patient states that her mail-order requirements have changed and because it's renewal time for these 2 meds, she needs the original Rx to send to the mail-order ([facility]).  She has to put her profile information on the back of the Rx.  States that after this time, we should be able to e-scribe everything to the mail-order.  She would like to pick up the 2 Rx's tomorrow afternoon.    Celebrex 200 mg bid #90 Amytriptoline 10mg  qhs #90

## 2014-03-26 MED ORDER — AMITRIPTYLINE HCL 10 MG PO TABS: 15.0000 mg | ORAL_TABLET | Freq: Every day | ORAL | Status: DC

## 2014-03-26 MED ORDER — CELECOXIB 200 MG PO CAPS: ORAL_CAPSULE | ORAL | Status: DC

## 2014-03-26 NOTE — Addendum Note (Signed)
Addended by: Amado Coe on: 03/26/2014 10:17 AM   Modules accepted: Orders

## 2014-03-26 NOTE — Telephone Encounter (Signed)
Celebrex and amytriptoline rx printed.

## 2014-04-06 ENCOUNTER — Other Ambulatory Visit: Payer: Self-pay | Admitting: Internal Medicine

## 2014-05-28 ENCOUNTER — Ambulatory Visit: Payer: PRIVATE HEALTH INSURANCE | Admitting: Internal Medicine

## 2014-05-28 ENCOUNTER — Encounter: Payer: Self-pay | Admitting: Internal Medicine

## 2014-05-28 ENCOUNTER — Other Ambulatory Visit (INDEPENDENT_AMBULATORY_CARE_PROVIDER_SITE_OTHER): Payer: PRIVATE HEALTH INSURANCE | Admitting: Internal Medicine

## 2014-05-28 VITALS — BP 110/76 | HR 105 | Temp 100.5°F | Wt 149.0 lb

## 2014-05-28 DIAGNOSIS — N390 Urinary tract infection, site not specified: Secondary | ICD-10-CM

## 2014-05-28 DIAGNOSIS — R5081 Fever presenting with conditions classified elsewhere: Secondary | ICD-10-CM

## 2014-05-28 DIAGNOSIS — R350 Frequency of micturition: Secondary | ICD-10-CM

## 2014-05-28 DIAGNOSIS — R110 Nausea: Secondary | ICD-10-CM | POA: Diagnosis not present

## 2014-05-28 LAB — CBC WITH DIFFERENTIAL/PLATELET
Basophils Absolute: 0 10*3/uL (ref 0.0–0.1)
Eosinophils Absolute: 0 10*3/uL (ref 0.0–0.7)
HCT: 37.7 % (ref 36.0–46.0)
HEMOGLOBIN: 12.6 g/dL (ref 12.0–15.0)
LYMPHS PCT: 9 % — AB (ref 12–46)
Lymphs Abs: 1 10*3/uL (ref 0.7–4.0)
MCH: 30.9 pg (ref 26.0–34.0)
MCHC: 33.3 g/dL (ref 30.0–36.0)
MCV: 92.4 fL (ref 78.0–100.0)
MONOS PCT: 4 % (ref 3–12)
Monocytes Absolute: 0.5 10*3/uL (ref 0.1–1.0)
NEUTROS ABS: 9.9 10*3/uL — AB (ref 1.7–7.7)
Neutrophils Relative %: 87 % — ABNORMAL HIGH (ref 43–77)
Platelets: 255 10*3/uL (ref 150–400)
RBC: 4.08 MIL/uL (ref 3.87–5.11)
RDW: 13 % (ref 11.5–15.5)
WBC: 11.4 10*3/uL — ABNORMAL HIGH (ref 4.0–10.5)

## 2014-05-28 LAB — POCT URINALYSIS DIPSTICK
GLUCOSE UA: NEGATIVE
Nitrite, UA: NEGATIVE
Spec Grav, UA: 1.01
Urobilinogen, UA: NEGATIVE
pH, UA: 8

## 2014-05-28 MED ORDER — ONDANSETRON HCL 4 MG PO TABS: 4.0000 mg | ORAL_TABLET | Freq: Three times a day (TID) | ORAL | Status: DC | PRN

## 2014-05-28 MED ORDER — ONDANSETRON HCL 4 MG/2ML IJ SOLN
4.0000 mg | Freq: Once | INTRAMUSCULAR | Status: AC
  Administered 2014-05-28: 4 mg via INTRAMUSCULAR

## 2014-05-28 MED ORDER — FLUCONAZOLE 150 MG PO TABS: 150.0000 mg | ORAL_TABLET | Freq: Once | ORAL | Status: DC

## 2014-05-28 MED ORDER — CIPROFLOXACIN HCL 500 MG PO TABS: 500.0000 mg | ORAL_TABLET | Freq: Two times a day (BID) | ORAL | Status: DC

## 2014-05-28 MED ORDER — CEFTRIAXONE SODIUM 1 G IJ SOLR
1.0000 g | Freq: Once | INTRAMUSCULAR | Status: AC
  Administered 2014-05-28: 1 g via INTRAMUSCULAR

## 2014-05-28 NOTE — Progress Notes (Signed)
   Subjective:    Patient ID: [name], female    DOB: 05-25-1956, 58 y.o.   MRN: [id number]  HPI  For a  week or so has had some dysuria. Now has right flank pain and fever. Feels slightly nauseated.    Review of Systems     Objective:   Physical Exam  Right CVA tenderness. Urinalysis abnormal. CBC shows white blood cell count 11,400. Urine culture pending      Assessment & Plan:  Pyelonephritis  Plan: Rocephin 1 g IM. Cipro 500 mg twice daily for 10 days. Return in 2 weeks for follow-up urinalysis. Culture is pending. Zofran tablets 4 mg by mouth if needed for nausea. Given IM Zofran here in the office. Dr. Maree Erie will give her another 1 g IM of Rocephin tomorrow which is Christmas Day.  Addendum: Urine culture grew 100,000 col/ml E. coli sensitive to Cipro which she is on. This is her first urinary tract infection ever.

## 2014-05-30 ENCOUNTER — Telehealth: Payer: Self-pay | Admitting: Internal Medicine

## 2014-05-30 NOTE — Telephone Encounter (Signed)
Called patient to see how she was feeling status post treatment for pyelonephritis. Says back pain has resolved and she's feeling much better. Culture is still pending.

## 2014-05-31 LAB — URINE CULTURE

## 2014-06-01 ENCOUNTER — Telehealth: Payer: Self-pay | Admitting: *Deleted

## 2014-06-01 NOTE — Telephone Encounter (Signed)
Spoke with patient regarding lab results informed patient we need repeat urine in 2 weeks. Patient verbalized understanding

## 2014-06-14 NOTE — Patient Instructions (Signed)
Take Zofran as needed for nausea. Start Cipro 500 mg twice daily for 10 days. Return in 2 weeks. Will receive another 1 g IM Rocephin tomorrow.

## 2014-06-17 ENCOUNTER — Encounter: Payer: Self-pay | Admitting: Internal Medicine

## 2014-06-17 ENCOUNTER — Other Ambulatory Visit (INDEPENDENT_AMBULATORY_CARE_PROVIDER_SITE_OTHER): Payer: PRIVATE HEALTH INSURANCE | Admitting: Internal Medicine

## 2014-06-17 DIAGNOSIS — Z0000 Encounter for general adult medical examination without abnormal findings: Secondary | ICD-10-CM

## 2014-06-17 LAB — POCT URINALYSIS DIPSTICK
Bilirubin, UA: NEGATIVE
Blood, UA: NEGATIVE
Glucose, UA: NEGATIVE
KETONES UA: NEGATIVE
Leukocytes, UA: NEGATIVE
Nitrite, UA: NEGATIVE
PH UA: 6
PROTEIN UA: NEGATIVE
UROBILINOGEN UA: NEGATIVE

## 2014-06-19 ENCOUNTER — Other Ambulatory Visit: Payer: PRIVATE HEALTH INSURANCE | Admitting: Internal Medicine

## 2014-08-04 ENCOUNTER — Telehealth: Payer: Self-pay | Admitting: Internal Medicine

## 2014-08-04 NOTE — Telephone Encounter (Signed)
Rx written as requested.

## 2014-08-04 NOTE — Telephone Encounter (Signed)
Patient calls; states that her mail order pharmacy is going to charge her $1500 for a 3 month supply of Celebrex UNLESS she has a NEW Rx for the [facility] for Celebrex.  She asked them to just send her generic for Celebrex.  They INSIST that she has to have a RX from [place] for the [facility] for Celebrex.  She is so apologetic that we have to do this.   Needs a new Rx for GENERIC Celebrex written to the mail order pharmacy for #180.  She takes it bid.    Thanks.

## 2014-08-04 NOTE — Telephone Encounter (Signed)
Spoke with patient, she will come by and pick up Rx.

## 2014-10-14 ENCOUNTER — Other Ambulatory Visit: Payer: Self-pay

## 2014-10-14 DIAGNOSIS — Z1231 Encounter for screening mammogram for malignant neoplasm of breast: Secondary | ICD-10-CM

## 2014-11-11 ENCOUNTER — Encounter: Payer: Self-pay | Admitting: Sports Medicine

## 2014-11-11 ENCOUNTER — Ambulatory Visit (INDEPENDENT_AMBULATORY_CARE_PROVIDER_SITE_OTHER): Payer: PRIVATE HEALTH INSURANCE | Admitting: Sports Medicine

## 2014-11-11 VITALS — BP 122/71 | Ht 65.0 in | Wt 147.0 lb

## 2014-11-11 DIAGNOSIS — M79671 Pain in right foot: Secondary | ICD-10-CM | POA: Diagnosis not present

## 2014-11-11 DIAGNOSIS — Z96653 Presence of artificial knee joint, bilateral: Secondary | ICD-10-CM

## 2014-11-11 NOTE — Patient Instructions (Addendum)
[name], It was a pleasure seeing you in the [facility] today. I think that you are having right knee pain due to peroneal nerve irritation. This is a nerve that runs along the outside of the knee, down your calf, and through your foot; it can sometimes be irritated in people who have had total knee replacement, especially when their knee is a little bit unstable. On ultrasound, we also found a small bone spur on your heel bone (the calcaneus).The bone spur is the likely cause of your heel pain and tenderness. To manage both the bone spur and the peroneal nerve irritation, I suggest the following: - Use the knee compression sleeves when exercising to manage any knee instability  - Do some lateral leg raises regularly to strengthen the muscles and ligaments on the outside of your legs - Work with your trainer on some one-leg standing to further strengthen the muscles in your thighs and calves - Use the heel wedge that I have attached to the insole for your right foot - Look through the [employer] catalogue and other resources and see if you can find any toe separators that you like which will reduce the tendency of your toes on both feet to overlap

## 2014-11-11 NOTE — Assessment & Plan Note (Signed)
Heel wedge added to her in- soles  If this does not take enough pressure off we can try heel cups  We will pad other shoes if this works

## 2014-11-11 NOTE — Progress Notes (Signed)
Subjective: [name] is a 59 y.o. female with a PMH significant for CPPD and bilateral total knee replacement, (alusio) presenting today with right heel pain of 3 months duration. She states that her she experiences severe pain on the posterolateral plantar aspect of her right heel. She notices the pain most with activity, especially as the day goes on. She denies any morning stiffness, resting pain, or pain that wakes her from sleep. She does also complain of pain on the posterolateral aspect of both knees, which began shortly after her knee replacements. She states that her heel pain is mitigated by some custom orthotics when worn with her running shoes, but that other shoes seem to make the pain worse. She has tried icing her feet, with incomplete relief.  Objective: NAD BP 122/71 mmHg  Ht 5\' 5"  (1.651 m)  Wt 147 lb (66.679 kg)  BMI 24.46 kg/m2  Right knee: Some LCL and MCL laxity as well as mild anterior-posterior instability is noted. Right knee is quite tender in the postero-lateral aspect of the knee over the peroneal nerve. Left knee with less mobility.  Some warmth on RT.  ML scars bilat. Right foot: Ankle and foot are without ecchymosis or visible effusion. Large hallux valgus is observed over the first metatarsal. 3rd on 2nd toe overlap noted. Right heel is tender on the posterolateral aspect of the palmar surface. No warmth, edema, or effusion is noted throughout the right ankle and foot. No TTP at PF indertion  Ultrasound of right foot is significant a bone spur on the lateral aspect of the calcaneus in the area of greatest tenderness.  There is some irregularity here in cortical surface.  PF looks normal.  Assessment/plan: [name] is a 59 y.o. female with a PMH of bilateral total knee replacement and CPPD, presenting with right heel pain and some bilateral knee pain. Her knee pain, based on onset after surgery, tenderness over the popliteal nerve, and the lateral knee  instability noted on exam, is likely related to peroneal nerve irritation. Her right heel pain is most likely related to the bone spur observed on ultrasound at the location of greatest tenderness. We attached a heel wedge to the insole for her right foot to improve the weight distribution across her foot. We advised the following. - Use the knee compression sleeves when exercising to manage any knee instability  - Do some lateral leg raises regularly to strengthen the muscles and ligaments on the outside of her legs - Work with her trainer on some one-leg standing to further strengthen the muscles in her thighs and calves - Use the insoles with the attached heel wedge  - Look through the [employer] catalogue and other resources and see if pt can find any toe separators that she likes which will reduce the tendency of her toes on both feet to overlap  Note dictated by Yvone Neu, MS-4  Good assessment and edited/  Ila Mcgill, MD

## 2014-11-11 NOTE — Assessment & Plan Note (Signed)
Still with some lateral knee pain worse on the right  I think her knee has too much worse on motion and stretches the peroneal nerve  use a compression sleeve when active

## 2015-01-06 ENCOUNTER — Telehealth: Payer: Self-pay | Admitting: Internal Medicine

## 2015-01-06 ENCOUNTER — Ambulatory Visit (INDEPENDENT_AMBULATORY_CARE_PROVIDER_SITE_OTHER): Payer: PRIVATE HEALTH INSURANCE | Admitting: Internal Medicine

## 2015-01-06 ENCOUNTER — Encounter: Payer: Self-pay | Admitting: Internal Medicine

## 2015-01-06 VITALS — BP 136/84 | HR 84 | Wt 150.0 lb

## 2015-01-06 DIAGNOSIS — S67191A Crushing injury of left index finger, initial encounter: Secondary | ICD-10-CM | POA: Diagnosis not present

## 2015-01-06 NOTE — Progress Notes (Signed)
   Subjective:    Patient ID: [name], female    DOB: September 30, 1955, 59 y.o.   MRN: [id number]  HPI Patient called about injury to left index finger last evening. Had taken some books for charity to the [employer] and accidentally slammed finger in car door.    Review of Systems     Objective:   Physical Exam Distal left index finger swollen with decreased range of motion. Nail is lacerated at base.       Assessment & Plan:  Nail laceration  Crush injury distal left index finger  Plan: To see hand surgeon. Tetanus immunization is up-to-date.

## 2015-01-06 NOTE — Telephone Encounter (Signed)
Can she come now?

## 2015-01-06 NOTE — Telephone Encounter (Signed)
Spoke with patient.  She will come on now for an appointment today.

## 2015-01-06 NOTE — Telephone Encounter (Signed)
Patient closed her finger in the car door last night.  Doesn't feel she broke the finger.  It did break her nail back to the base of her nail bed laterally.  She was at the "[employer]".  They assisted her with cleaning it with antibiotic soap and wrapped it tightly and she has been putting triple antibiotic ointment on it.  She is going to the beach on Saturday.  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  It isn't heated around the nail bed, but it IS an open wound.  She has continued to clean it, put the antibiotic ointment on it, but is just concerned about it.    Do you feel she should be seen prior to going to the beach and antibiotics given?  Please advise.    #[phone]

## 2015-01-06 NOTE — Patient Instructions (Signed)
To see Dr. Burney Gauze today

## 2015-01-18 ENCOUNTER — Other Ambulatory Visit: Payer: Self-pay | Admitting: Gynecology

## 2015-01-19 LAB — CYTOLOGY - PAP

## 2015-01-21 ENCOUNTER — Other Ambulatory Visit: Payer: Self-pay | Admitting: *Deleted

## 2015-01-21 MED ORDER — AMITRIPTYLINE HCL 10 MG PO TABS: 15.0000 mg | ORAL_TABLET | Freq: Every day | ORAL | Status: DC

## 2015-01-21 NOTE — Telephone Encounter (Signed)
Elavil refilled.

## 2015-01-26 ENCOUNTER — Ambulatory Visit
Admission: RE | Admit: 2015-01-26 | Discharge: 2015-01-26 | Disposition: A | Discharge status: Home / Self Care | Payer: PRIVATE HEALTH INSURANCE | Source: Ambulatory Visit

## 2015-01-26 DIAGNOSIS — Z1231 Encounter for screening mammogram for malignant neoplasm of breast: Secondary | ICD-10-CM

## 2015-06-09 ENCOUNTER — Other Ambulatory Visit: Payer: Self-pay | Admitting: Internal Medicine

## 2015-12-16 ENCOUNTER — Other Ambulatory Visit: Payer: PRIVATE HEALTH INSURANCE | Admitting: Internal Medicine

## 2015-12-16 DIAGNOSIS — Z13220 Encounter for screening for lipoid disorders: Secondary | ICD-10-CM

## 2015-12-16 DIAGNOSIS — Z0000 Encounter for general adult medical examination without abnormal findings: Secondary | ICD-10-CM

## 2015-12-16 DIAGNOSIS — Z1321 Encounter for screening for nutritional disorder: Secondary | ICD-10-CM

## 2015-12-16 DIAGNOSIS — Z1329 Encounter for screening for other suspected endocrine disorder: Secondary | ICD-10-CM

## 2015-12-16 DIAGNOSIS — Z130 Encounter for screening for diseases of the blood and blood-forming organs and certain disorders involving the immune mechanism: Secondary | ICD-10-CM

## 2015-12-16 LAB — CBC WITH DIFFERENTIAL/PLATELET
BASOS ABS: 35 {cells}/uL (ref 0–200)
Basophils Relative: 1 %
Eosinophils Absolute: 140 cells/uL (ref 15–500)
Eosinophils Relative: 4 %
HEMATOCRIT: 40 % (ref 35.0–45.0)
Hemoglobin: 13.1 g/dL (ref 11.7–15.5)
LYMPHS ABS: 1610 {cells}/uL (ref 850–3900)
LYMPHS PCT: 46 %
MCH: 30.1 pg (ref 27.0–33.0)
MCHC: 32.8 g/dL (ref 32.0–36.0)
MCV: 92 fL (ref 80.0–100.0)
MONO ABS: 525 {cells}/uL (ref 200–950)
MPV: 8.9 fL (ref 7.5–12.5)
Monocytes Relative: 15 %
NEUTROS PCT: 34 %
Neutro Abs: 1190 cells/uL — ABNORMAL LOW (ref 1500–7800)
Platelets: 303 10*3/uL (ref 140–400)
RBC: 4.35 MIL/uL (ref 3.80–5.10)
RDW: 13.8 % (ref 11.0–15.0)
WBC: 3.5 10*3/uL — AB (ref 3.8–10.8)

## 2015-12-16 LAB — TSH: TSH: 1.51 mIU/L

## 2015-12-17 LAB — COMPLETE METABOLIC PANEL WITH GFR
ALK PHOS: 73 U/L (ref 33–130)
ALT: 17 U/L (ref 6–29)
AST: 22 U/L (ref 10–35)
Albumin: 4 g/dL (ref 3.6–5.1)
BUN: 16 mg/dL (ref 7–25)
CALCIUM: 9.1 mg/dL (ref 8.6–10.4)
CO2: 27 mmol/L (ref 20–31)
Chloride: 105 mmol/L (ref 98–110)
Creat: 0.95 mg/dL (ref 0.50–1.05)
GFR, Est African American: 76 mL/min (ref >=60)
GFR, Est Non African American: 66 mL/min (ref >=60)
Glucose, Bld: 88 mg/dL (ref 65–99)
POTASSIUM: 4.3 mmol/L (ref 3.5–5.3)
Sodium: 140 mmol/L (ref 135–146)
Total Bilirubin: 0.7 mg/dL (ref 0.2–1.2)
Total Protein: 6.4 g/dL (ref 6.1–8.1)

## 2015-12-17 LAB — LIPID PANEL
CHOL/HDL RATIO: 2 ratio (ref <=5.0)
Cholesterol: 212 mg/dL — ABNORMAL HIGH (ref 125–200)
HDL: 107 mg/dL (ref >=46)
LDL Cholesterol: 95 mg/dL (ref <130)
Triglycerides: 49 mg/dL (ref <150)
VLDL: 10 mg/dL (ref <30)

## 2015-12-17 LAB — VITAMIN D 25 HYDROXY (VIT D DEFICIENCY, FRACTURES): VIT D 25 HYDROXY: 38 ng/mL (ref 30–100)

## 2015-12-21 ENCOUNTER — Ambulatory Visit (INDEPENDENT_AMBULATORY_CARE_PROVIDER_SITE_OTHER): Payer: PRIVATE HEALTH INSURANCE | Admitting: Internal Medicine

## 2015-12-21 ENCOUNTER — Encounter: Payer: Self-pay | Admitting: Internal Medicine

## 2015-12-21 VITALS — BP 122/80 | HR 67 | Temp 99.1°F | Ht 65.0 in | Wt 146.0 lb

## 2015-12-21 DIAGNOSIS — Z0000 Encounter for general adult medical examination without abnormal findings: Secondary | ICD-10-CM | POA: Diagnosis not present

## 2015-12-21 DIAGNOSIS — M25561 Pain in right knee: Secondary | ICD-10-CM | POA: Diagnosis not present

## 2015-12-21 DIAGNOSIS — J309 Allergic rhinitis, unspecified: Secondary | ICD-10-CM | POA: Diagnosis not present

## 2015-12-21 DIAGNOSIS — D708 Other neutropenia: Secondary | ICD-10-CM

## 2015-12-21 DIAGNOSIS — Z8739 Personal history of other diseases of the musculoskeletal system and connective tissue: Secondary | ICD-10-CM | POA: Diagnosis not present

## 2015-12-21 DIAGNOSIS — M25562 Pain in left knee: Secondary | ICD-10-CM

## 2015-12-21 DIAGNOSIS — R7989 Other specified abnormal findings of blood chemistry: Secondary | ICD-10-CM

## 2015-12-21 DIAGNOSIS — Z8669 Personal history of other diseases of the nervous system and sense organs: Secondary | ICD-10-CM | POA: Diagnosis not present

## 2015-12-21 DIAGNOSIS — D709 Neutropenia, unspecified: Secondary | ICD-10-CM

## 2015-12-21 LAB — POCT URINALYSIS DIPSTICK
Bilirubin, UA: NEGATIVE
GLUCOSE UA: NEGATIVE
Ketones, UA: NEGATIVE
Leukocytes, UA: NEGATIVE
NITRITE UA: NEGATIVE
PH UA: 6.5
Protein, UA: NEGATIVE
RBC UA: NEGATIVE
Spec Grav, UA: 1.02
UROBILINOGEN UA: 0.2

## 2015-12-21 MED ORDER — ZOLMITRIPTAN 5 MG PO TABS: 5.0000 mg | ORAL_TABLET | ORAL | Status: DC | PRN

## 2015-12-31 ENCOUNTER — Other Ambulatory Visit: Payer: Self-pay | Admitting: Oncology

## 2015-12-31 DIAGNOSIS — D759 Disease of blood and blood-forming organs, unspecified: Secondary | ICD-10-CM

## 2016-01-02 DIAGNOSIS — R7989 Other specified abnormal findings of blood chemistry: Secondary | ICD-10-CM | POA: Insufficient documentation

## 2016-01-02 DIAGNOSIS — D709 Neutropenia, unspecified: Secondary | ICD-10-CM | POA: Insufficient documentation

## 2016-01-02 NOTE — Progress Notes (Signed)
Subjective:    Patient ID: [name], female    DOB: 12-22-1955, 60 y.o.   MRN: [id number]  HPI Very pleasant 60 year old White Female in today for health maintenance exam and evaluation of medical issues. She has a history of calcium pyrophosphate deposition disease (CPPD), migraine headaches, allergic rhinitis. In 2012 she had bilateral knee replacement surgery.  Allergic to grass pollens molds.  No known drug allergies  Social history: Completed 4 years of college. Is a homemaker. Husband is a radiologist with [employer] radiology. 2 children a daughter and a son both adults. Nonsmoker. Social alcohol consumption.  Family history: Father died of aplastic anemia with history of CVA. Mother with history of subependymoma of the fourth ventricle. Patient sister died of lower extremity sarcoma at the age of 64.    Review of Systems  Constitutional: Negative.   Respiratory: Negative.   Cardiovascular: Negative.   Gastrointestinal: Negative.   Musculoskeletal:       Continues to have issues with bilateral knee pain despite bilateral knee replacement       Objective:   Physical Exam  Constitutional: She is oriented to person, place, and time. She appears well-developed and well-nourished. No distress.  HENT:  Head: Normocephalic and atraumatic.  Right Ear: External ear normal.  Left Ear: External ear normal.  Mouth/Throat: Oropharynx is clear and moist. No oropharyngeal exudate.  Eyes: Conjunctivae and EOM are normal. Pupils are equal, round, and reactive to light. Right eye exhibits no discharge. Left eye exhibits no discharge. No scleral icterus.  Neck: Neck supple. No JVD present. No thyromegaly present.  Cardiovascular: Normal rate, regular rhythm and normal heart sounds.   No murmur heard. Pulmonary/Chest: Effort normal and breath sounds normal. No respiratory distress. She has no wheezes. She has no rales.  Breasts normal female without masses  Abdominal: Soft. Bowel  sounds are normal. She exhibits no distension and no mass. There is no tenderness. There is no rebound and no guarding.  Genitourinary:  Genitourinary Comments: Pap done in 2016 by Dr. Carren Rang  Musculoskeletal: She exhibits no edema.  Lymphadenopathy:    She has no cervical adenopathy.  Neurological: She is alert and oriented to person, place, and time. She has normal reflexes. No cranial nerve deficit. Coordination normal.  Skin: Skin is warm and dry. No rash noted. She is not diaphoretic.  Psychiatric: She has a normal mood and affect. Her behavior is normal. Judgment and thought content normal.  Vitals reviewed.         Assessment & Plan:  Patient has persistent low white blood cell count. She had peripheral review of smear in 2014 which was unremarkable. White count in October 2013 was 3600 but by January 2014 was 4300. It was 3900 and February 2015, was 11,400 in December 2015 and is now 3500. Absolute neutrophils are low at 1190. Platelet count and hemoglobin are normal. MCV is normal. Family history of aplastic anemia and father. She is also on chronic anti-inflammatory medication which can cause neutropenia. She really needs this to manage her bilateral knee pain.  CPPD-patient continues to have chronic bilateral knee pain but basically takes her anti-inflammatory medication and puts up with it  History of migraine headaches  Allergic rhinitis  High HDL cholesterol at 107  Plan: I would like for her to have hematology consultation regarding low white blood cell count. My feeling is it could be due to inflammatory arthritis or anti-inflammatory medication. However, I would be more comfortable getting on formal consultation  with Hematology. She will see Dr. Jana Hakim in August.

## 2016-01-02 NOTE — Patient Instructions (Addendum)
It was a pleasure to see you today. Continue current medications. Appointment will be arranged with Hematology regarding low white blood cell count.

## 2016-01-03 ENCOUNTER — Telehealth: Payer: Self-pay | Admitting: Oncology

## 2016-01-03 NOTE — Telephone Encounter (Signed)
Pt returned my call. Moved lab appointment to 8/1 instead of 8/14 and appointment with GM from 8/22 to 8/14 at 430pm. Pt will  Be out of town the week of 8/7.

## 2016-01-04 ENCOUNTER — Other Ambulatory Visit (HOSPITAL_BASED_OUTPATIENT_CLINIC_OR_DEPARTMENT_OTHER): Payer: PRIVATE HEALTH INSURANCE

## 2016-01-04 DIAGNOSIS — D759 Disease of blood and blood-forming organs, unspecified: Secondary | ICD-10-CM

## 2016-01-04 DIAGNOSIS — Z862 Personal history of diseases of the blood and blood-forming organs and certain disorders involving the immune mechanism: Secondary | ICD-10-CM | POA: Diagnosis not present

## 2016-01-04 LAB — CBC & DIFF AND RETIC
BASO%: 2.1 % — AB (ref 0.0–2.0)
Basophils Absolute: 0.1 10*3/uL (ref 0.0–0.1)
EOS ABS: 0.1 10*3/uL (ref 0.0–0.5)
EOS%: 3.5 % (ref 0.0–7.0)
HCT: 40 % (ref 34.8–46.6)
HEMOGLOBIN: 13.4 g/dL (ref 11.6–15.9)
IMMATURE RETIC FRACT: 2.2 % (ref 1.60–10.00)
LYMPH%: 48.5 % (ref 14.0–49.7)
MCH: 30.4 pg (ref 25.1–34.0)
MCHC: 33.5 g/dL (ref 31.5–36.0)
MCV: 90.7 fL (ref 79.5–101.0)
MONO#: 0.3 10*3/uL (ref 0.1–0.9)
MONO%: 9.4 % (ref 0.0–14.0)
NEUT#: 1.2 10*3/uL — ABNORMAL LOW (ref 1.5–6.5)
NEUT%: 36.5 % — ABNORMAL LOW (ref 38.4–76.8)
Platelets: 294 10*3/uL (ref 145–400)
RBC: 4.41 10*6/uL (ref 3.70–5.45)
RDW: 13.9 % (ref 11.2–14.5)
Retic %: 0.95 % (ref 0.70–2.10)
Retic Ct Abs: 41.9 10*3/uL (ref 33.70–90.70)
WBC: 3.4 10*3/uL — AB (ref 3.9–10.3)
lymph#: 1.7 10*3/uL (ref 0.9–3.3)

## 2016-01-04 LAB — IRON AND TIBC
%SAT: 31 % (ref 21–57)
Iron: 94 ug/dL (ref 41–142)
TIBC: 300 ug/dL (ref 236–444)
UIBC: 206 ug/dL (ref 120–384)

## 2016-01-04 LAB — FERRITIN: Ferritin: 84 ng/ml (ref 9–269)

## 2016-01-04 LAB — CHCC SMEAR

## 2016-01-05 LAB — FOLATE: FOLATE: 15.8 ng/mL (ref >3.0)

## 2016-01-05 LAB — C-REACTIVE PROTEIN: CRP: 0.9 mg/L (ref 0.0–4.9)

## 2016-01-05 LAB — VITAMIN B12: VITAMIN B 12: 568 pg/mL (ref 211–946)

## 2016-01-10 ENCOUNTER — Encounter: Payer: Self-pay | Admitting: Oncology

## 2016-01-10 ENCOUNTER — Other Ambulatory Visit: Payer: Self-pay | Admitting: Oncology

## 2016-01-16 NOTE — Progress Notes (Signed)
[place]  [phone] [phone]     ID: JERMANY SUNDELL DOB: [birth year]  MR#: [id number]  OQH#:[id number]  Patient Care Team: Elby Showers, MD as PCP - General (Internal Medicine) Bo Merino, MD as Consulting Physician (Rheumatology) Gaynelle Arabian, MD as Consulting Physician (Orthopedic Surgery) Jovita Gamma, MD as Consulting Physician (Neurosurgery) Clarene Essex, MD as Consulting Physician (Gastroenterology) OTHER MD:  CHIEF COMPLAINT: Mild intermittent neutropenia  CURRENT TREATMENT: Observation  RESEARCH PROTOCOL: n/a  PAST MEDICAL HISTORY: We have hematology data on Or[place]le dating back to February 2010. Addis's Hemoglobin has been normal all this time  except for a brief period of postoperative anemia October 2012. Similarly the platelet count has been normal as far back as tested,   The white cell count however has occasionally fallen below the 4.0 "lower limit of normal". It was 3.7 on 01/20/2011 but normal at 4.4 two months later. It was 3.6 on 03/25/2012 but again normal 3 months later (4.3 on 06/13/2012) It was  3.9 on 07/20/2013, back up to 11.4 on 05/28/2014, then 3.5 on 12/16/2015 and 3.4 on 01/04/2016.   The neutrophil count has been variable as well, it was 1.1 on 01/20/2011, then normal 03/28/2011, down to 1.0 on 03/25/2012, normal on repeats January and September 2014 and December 2015, then 1.2 on 12/16/2015 and 1.2 again 01/04/2016.  On 54/65/6812, cyclic citrulline peptide anti-body, ANA, rheumatoid factor and ESR will or within normal limits  On 06/13/2012, with a white cell count of 4.3 but neutrophil count of 1.0, pathologist review of the peripheral blood film showed only mature segmented neutrophils. The red cells and platelets were unremarkable.  Her subsequent history is as detailed below  INTERVAL HISTORY: Nasiah was evaluated in the hematology clinic 01/17/2016  REVIEW OF SYSTEMS: Her biggest  problem is arthritis, chiefly involving the knees, and this does limit her to some extent. Still she works out 3 times a week with a Clinical research associate. She has a history of CDPD but tells me her flares have become less frequent. She is using Celebrex once a day instead of twice a day at this point. She does not have diffuse symmetrical erosive or inflammatory arthritis, rash, fever, unexplained weight loss, unexplained fatigue, or rash. A detailed review of systems today was otherwise noncontributory  No Known Allergies  Current Outpatient Prescriptions  Medication Sig Dispense Refill  . amitriptyline (ELAVIL) 10 MG tablet Take 1.5 tablets (15 mg total) by mouth at bedtime. 90 tablet 3  . aspirin EC 81 MG tablet Take 81 mg by mouth every morning.    . celecoxib (CELEBREX) 200 MG capsule TAKE 1 CAPSULE BY MOUTH  TWICE A DAY (Patient taking differently: TAKE 1 CAPSULE BY MOUTH  QD.) 180 capsule 3  . Cholecalciferol (VITAMIN D) 2000 UNITS CAPS Take 2,000 Units by mouth daily.    . magnesium oxide (MAG-OX) 400 MG tablet Take 400 mg by mouth daily.    . Melatonin 5 MG TABS Take 5 mg by mouth daily.    . nitroGLYCERIN (NITROSTAT) 0.4 MG SL tablet Place 1 tablet (0.4 mg total) under the tongue every 5 (five) minutes as needed (for chest or arm pain). (Patient not taking: Reported on 01/06/2015) 30 tablet 0  . TURMERIC PO Take 2 tablets by mouth daily.    Marland Kitchen zolmitriptan (ZOMIG) 5 MG tablet Take 1 tablet (5 mg total) by mouth as needed for migraine. Reported on 12/21/2015 10 tablet 5   No current facility-administered medications for this  visit.     PAST MEDICAL HISTORY: Past Medical History:  Diagnosis Date  . Allergy   . Arthritis   . Complication of anesthesia   . H/O calcium pyrophosphate deposition disease (CPPD)   . Migraine   . PONV (postoperative nausea and vomiting)     PAST SURGICAL HISTORY: Past Surgical History:  Procedure Laterality Date  . BUNIONECTOMY Left   . CERVICAL DISCECTOMY     C3-4   . EYE SURGERY Bilateral    LASIK  . JOINT REPLACEMENT Bilateral    knees  . KNEE ARTHROSCOPY Left   . KNEE ARTHROSCOPY Right 05/14/2013   Procedure: RIGHT ARTHROSCOPY KNEE;  Surgeon: Gearlean Alf, MD;  Location: [facility];  Service: Orthopedics;  Laterality: Right;    FAMILY HISTORY Family History  Problem Relation Age of Onset  . Heart disease Mother   The patient's father died at age 75 from what sounds like pure Red cell aplasia. There may have been a history of benzene or other exposure while in the [employer]. The patient's mother died from remote complications of surgery for a sup ependymal tumor (she eventually developed swallowing dysfunction). The patient has 2 brothers. One sister died from undifferentiated sarcoma at the age of 33.--  GENETICS TESTING: n/a; The patient's ethnicity is [place]  GYNECOLOGIC HISTORY:  No LMP recorded. Patient is postmenopausal. Menarche age 45, first live birth age 76, she is Star P2. She underwent menopause approximately age 89. She never took hormone replacement. 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 Her husband Elta Guadel[place] of course is our colleague in radiology. Their son Luana Shu lives in [place] where he works in Engineer, mining as. Daughter [place] lives in a plan of where she is an Administrator for. The patient has no grandchildren. She attends only [employer]    ADVANCED DIRECTIVES: <no information>   HEALTH MAINTENANCE: Social History  Substance Use Topics  . Smoking status: Never Smoker  . Smokeless tobacco: Never Used  . Alcohol use Yes     Comment: socially     Colonoscopy: 01/24/2013/ [facility]  PAP: 01/18/2015/ [facility]  Bone density: 06/25/2012; T - 1.8  Lipid panel: 12/16/2015/ [facility]/ LDL 95, HDL 107  Mammography: last 01/27/2015    OBJECTIVE: Middle-aged white woman who appears younger than stated age 60:   01/17/16 1624  BP:  (!) 122/94  Pulse: 85  Resp: 18  Temp: 98.6 F (37 C)     Body mass index is 24.68 kg/m.    ECOG FS:1 - Symptomatic but completely ambulatory  Ocular: Sclerae unicteric, pupils equal, round and reactive to light Ear-nose-throat: Oropharynx clear, Left tonsil appears mildly inflamed Lymphatic: No cervical or supraclavicular adenopathy, no axillary adenopathy Lungs no rales or rhonchi, good excursion bilaterally Heart regular rate and rhythm, no murmur appreciated Abd soft, nontender, positive bowel sounds, no splenomegaly MSK no focal spinal tenderness, no joint edema Neuro: non-focal, well-oriented, appropriate affect Breasts: Deferred   LAB RESULTS: On 01/04/2016, the serum folate was 15.8, B12 568, ferritin 84, iron saturation 31, C-reactive protein 0.9  CMP     Component Value Date/Time   NA 140 12/16/2015 1101   K 4.3 12/16/2015 1101   CL 105 12/16/2015 1101   CO2 27 12/16/2015 1101   GLUCOSE 88 12/16/2015 1101   BUN 16 12/16/2015 1101   CREATININE 0.95 12/16/2015 1101   CALCIUM 9.1 12/16/2015 1101   PROT 6.4 12/16/2015  1101   ALBUMIN 4.0 12/16/2015 1101   AST 22 12/16/2015 1101   ALT 17 12/16/2015 1101   ALKPHOS 73 12/16/2015 1101   BILITOT 0.7 12/16/2015 1101   GFRNONAA 66 12/16/2015 1101   GFRAA 76 12/16/2015 1101    No results found for: KPAFRELGTCHN, LAMBDASER, KAPLAMBRATIO  No results found for: Ronnald Ramp, A1GS, A2GS, BETS, BETA2SER, GAMS, MSPIKE, SPEI  Lab Results  Component Value Date   WBC 3.4 (L) 01/04/2016   NEUTROABS 1.2 (L) 01/04/2016   HGB 13.4 01/04/2016   HCT 40.0 01/04/2016   MCV 90.7 01/04/2016   PLT 294 01/04/2016      Chemistry      Component Value Date/Time   NA 140 12/16/2015 1101   K 4.3 12/16/2015 1101   CL 105 12/16/2015 1101   CO2 27 12/16/2015 1101   BUN 16 12/16/2015 1101   CREATININE 0.95 12/16/2015 1101      Component Value Date/Time   CALCIUM 9.1 12/16/2015 1101   ALKPHOS 73 12/16/2015 1101   AST  22 12/16/2015 1101   ALT 17 12/16/2015 1101   BILITOT 0.7 12/16/2015 1101       No results found for: LABCA2  No components found for: LABCA125  No results for input(s): INR in the last 168 hours.  Urinalysis    Component Value Date/Time   COLORURINE YELLOW 03/27/2011 1759   APPEARANCEUR CLEAR 03/27/2011 1759   LABSPEC 1.006 03/27/2011 1759   PHURINE 7.5 03/27/2011 1759   GLUCOSEU NEGATIVE 03/27/2011 1759   HGBUR NEGATIVE 03/27/2011 1759   BILIRUBINUR negative 12/21/2015 1526   KETONESUR NEGATIVE 03/27/2011 1759   PROTEINUR negative 12/21/2015 1526   PROTEINUR NEGATIVE 03/27/2011 1759   UROBILINOGEN 0.2 12/21/2015 1526   UROBILINOGEN 0.2 03/27/2011 1759   NITRITE negative 12/21/2015 1526   NITRITE NEGATIVE 03/27/2011 1759   LEUKOCYTESUR Negative 12/21/2015 1526    RADIOLOGY AND OTHER STUDIES: No results found.  BLOOD SMEAR REVIEW:  the RBC series shows minimal rouleaux; there are no schistocytes, no tailed poikylocytes, no NRBCs, and no anisocytosis. There are normal platelet numbers and no clumps noted. The white cells consist mostly of mature lymphs with scant cytoplasm and mature polys with normal segmentation. There is no left shift. Overall smear review is normal  ASSESSMENT: 60 y.o. [place] woman with mild intermittent leukopenia  PLAN: We reviewed the fact that all blood cells are grown in the marrow, and we reviewed the different functions of white cells, red cells, and platelets. We discussed the fact that when 2 of the 3 cell lines are normal, we are very unlikely to be dealing with a primary bone marrow problem. Also the blood film shows no white cell or red cell abnormalities suggestive either of myelodysplasia, myelophthisis or leukemia.  We noted the fact that her low white and neutrophil cell counts have been mild and intermittent. They are not associated with any increase in infections. She has been screened for autoimmune diseases with negative workup.  Finally there is no evidence for splenomegaly by exam. The fact that the platelet count was normal also speaks against splenomegaly.   This means we are most likely dealing with 1 of 3 phenomena. One is transient viral infections, which are known to frequently decrease the white cell count. Her history certainly would be consistent with this and the viral infections could be subclinical.  A second possibility is that we are dealing with a statistical fluke. The way a normal white blood cell count is defined  is by taking a normal population (by definition all people tested will have no demonstrable illnesses) and mark off 2 standard deviations. This will include 95% of the normal population. The upshot is that about 4-5% of the normal population will have "abnormal" counts. About 2% will have white blood cell counts above 2 standard deviations from the mean, and about 2% will have white blood cell count below that. That certainly could be playing a role here.  A third possibility, and I think the only one that might be clinically significant, is that one of her medications is causing a decrease in her white cells. Both amitriptyline and celecoxib had been reported to cause leukopenia, although rarely (approximately 1% of cases). I do not find similar reports for zolmitriptan or for melatonin (which actually can increase the white cell count in some cases).  Accordingly one option would be to go off one of the possibly offending medications and see if the white count not only normalizes (it has normalized in the past without any interventions) but fails to again drop for a prolonged period (of perhaps 2 years).  The other option is simply to repeat her white cell count in 2 months. If it has normalized, as it has in the past, then we would accept the fact that she has a tendency to occasionally demonstrate slightly low normal white cell counts. So long as we do not have a history of persistently low counts,  progressively lower counts, low counts involving either of the other 2 cell lines, or persistent or unusual infections, no further evaluation would be needed.   This is the option that Sujata prefers, and I am certainly comfortable with it.  Accordingly I have asked her to stop by the first week in October for a repeat CBC. I will review her blood film at that time. Assuming counts have normalized, no further follow-up here would be scheduled.  She knows to contact me for any other problems that we might need to address.  Chauncey Cruel, MD   01/17/2016 5:28 PM Medical Oncology and Hematology [facility] [address] [place],  [address] Tel. [phone]    Fax. [phone]

## 2016-01-17 ENCOUNTER — Other Ambulatory Visit: Payer: PRIVATE HEALTH INSURANCE

## 2016-01-17 ENCOUNTER — Encounter: Payer: Self-pay | Admitting: Oncology

## 2016-01-17 ENCOUNTER — Ambulatory Visit (HOSPITAL_BASED_OUTPATIENT_CLINIC_OR_DEPARTMENT_OTHER): Payer: PRIVATE HEALTH INSURANCE | Admitting: Oncology

## 2016-01-17 VITALS — BP 122/94 | HR 85 | Temp 98.6°F | Resp 18 | Ht 65.0 in | Wt 148.3 lb

## 2016-01-17 DIAGNOSIS — D708 Other neutropenia: Secondary | ICD-10-CM

## 2016-01-17 DIAGNOSIS — D72819 Decreased white blood cell count, unspecified: Secondary | ICD-10-CM

## 2016-01-17 NOTE — Progress Notes (Signed)
Many Thanks! 

## 2016-01-18 ENCOUNTER — Other Ambulatory Visit: Payer: Self-pay | Admitting: Internal Medicine

## 2016-01-21 ENCOUNTER — Other Ambulatory Visit: Payer: Self-pay | Admitting: Obstetrics & Gynecology

## 2016-01-21 DIAGNOSIS — Z1231 Encounter for screening mammogram for malignant neoplasm of breast: Secondary | ICD-10-CM

## 2016-01-25 ENCOUNTER — Ambulatory Visit: Payer: PRIVATE HEALTH INSURANCE | Admitting: Oncology

## 2016-01-27 ENCOUNTER — Ambulatory Visit
Admission: RE | Admit: 2016-01-27 | Discharge: 2016-01-27 | Disposition: A | Discharge status: Home / Self Care | Payer: PRIVATE HEALTH INSURANCE | Source: Ambulatory Visit | Attending: Obstetrics & Gynecology | Admitting: Obstetrics & Gynecology

## 2016-01-27 DIAGNOSIS — Z1231 Encounter for screening mammogram for malignant neoplasm of breast: Secondary | ICD-10-CM

## 2016-03-06 ENCOUNTER — Other Ambulatory Visit (HOSPITAL_BASED_OUTPATIENT_CLINIC_OR_DEPARTMENT_OTHER): Payer: PRIVATE HEALTH INSURANCE

## 2016-03-06 DIAGNOSIS — D708 Other neutropenia: Secondary | ICD-10-CM

## 2016-03-06 DIAGNOSIS — D72819 Decreased white blood cell count, unspecified: Secondary | ICD-10-CM

## 2016-03-06 LAB — CBC WITH DIFFERENTIAL/PLATELET
BASO%: 1.2 % (ref 0.0–2.0)
BASOS ABS: 0.1 10*3/uL (ref 0.0–0.1)
EOS%: 2.2 % (ref 0.0–7.0)
Eosinophils Absolute: 0.1 10*3/uL (ref 0.0–0.5)
HEMATOCRIT: 40.9 % (ref 34.8–46.6)
HGB: 13.9 g/dL (ref 11.6–15.9)
LYMPH#: 1.7 10*3/uL (ref 0.9–3.3)
LYMPH%: 40.5 % (ref 14.0–49.7)
MCH: 30.4 pg (ref 25.1–34.0)
MCHC: 34 g/dL (ref 31.5–36.0)
MCV: 89.5 fL (ref 79.5–101.0)
MONO#: 0.4 10*3/uL (ref 0.1–0.9)
MONO%: 10.6 % (ref 0.0–14.0)
NEUT#: 1.9 10*3/uL (ref 1.5–6.5)
NEUT%: 45.5 % (ref 38.4–76.8)
PLATELETS: 285 10*3/uL (ref 145–400)
RBC: 4.57 10*6/uL (ref 3.70–5.45)
RDW: 13.9 % (ref 11.2–14.5)
WBC: 4.2 10*3/uL (ref 3.9–10.3)

## 2016-03-06 LAB — CHCC SMEAR

## 2016-04-04 ENCOUNTER — Telehealth: Payer: Self-pay

## 2016-04-04 NOTE — Telephone Encounter (Signed)
I returned call to patient with lab results and confirmed, per GM last office note, that patient does not need follow up.  Pt voiced understanding.

## 2016-04-14 ENCOUNTER — Encounter: Payer: Self-pay | Admitting: Podiatry

## 2016-04-14 ENCOUNTER — Ambulatory Visit (INDEPENDENT_AMBULATORY_CARE_PROVIDER_SITE_OTHER): Payer: PRIVATE HEALTH INSURANCE

## 2016-04-14 ENCOUNTER — Ambulatory Visit (INDEPENDENT_AMBULATORY_CARE_PROVIDER_SITE_OTHER): Payer: PRIVATE HEALTH INSURANCE | Admitting: Podiatry

## 2016-04-14 VITALS — BP 126/77 | HR 78

## 2016-04-14 DIAGNOSIS — M21619 Bunion of unspecified foot: Secondary | ICD-10-CM

## 2016-04-14 DIAGNOSIS — M79672 Pain in left foot: Secondary | ICD-10-CM

## 2016-04-14 DIAGNOSIS — M79671 Pain in right foot: Secondary | ICD-10-CM

## 2016-04-14 DIAGNOSIS — M2040 Other hammer toe(s) (acquired), unspecified foot: Secondary | ICD-10-CM | POA: Diagnosis not present

## 2016-04-14 NOTE — Patient Instructions (Signed)
Hammer Toes Hammer toes is a condition in which one or more of your toes is permanently flexed. CAUSES  This happens when a muscle imbalance or abnormal bone length makes your small toes buckle. This causes the toe joint to contract and the strong cord-like bands that attach muscles to the bones (tendons) in your toes to shorten.  SIGNS AND SYMPTOMS  Common symptoms of flexible hammer toes include:   A buildup of skin cells (corns). Corns occur where boney bumps come in frequent contact with hard surfaces. For example, where your shoes press and rub.  Irritation.  Inflammation.  Pain.  Limited motion in your toes. DIAGNOSIS  Hammer toes are diagnosed through a physical exam of your toes. During the exam, your health care provider may try to reproduce your symptoms by manipulating your foot. Often, X-ray exams are done to determine the degree of deformity and to make sure that the cause is not a fracture.  TREATMENT  Hammer toes can be treated with corrective surgery. There are several types of surgical procedures that can treat hammer toes. The most common procedures include:  Arthroplasty--A portion of the joint is surgically removed and your toe is straightened. The gap fills in with fibrous tissue. This procedure helps treat pain and deformity and helps restore function.  Fusion--Cartilage between the two bones of the affected joint is taken out and the bones fuse together into one longer bone. This helps keep your toe stable and reduces pain but leaves your toe stiff, yet straight.  Implantation--A portion of your bone is removed and replaced with an implant to restore motion.  Flexor tendon transfers--This procedure repositions the tendons that curl the toes down (flexor tendons). This may be done to release the deforming force that causes your toe to buckle. Several of these procedures require fixing your toe with a pin that is visible at the tip of your toe. The pin keeps the toe  straight during healing. Your health care provider will remove the pin usually within 4-8 weeks after the procedure.    This information is not intended to replace advice given to you by your health care provider. Make sure you discuss any questions you have with your health care provider.   Document Released: 05/19/2000 Document Revised: 05/27/2013 Document Reviewed: 01/27/2013 Elsevier Interactive Patient Education 2016 [employer] Inc.  

## 2016-04-14 NOTE — Progress Notes (Signed)
Subjective:     Patient ID: KEYSHIA KEARN, female   DOB: 1955/08/20, 60 y.o.   MRN: [id number]  HPI patient presents stating that she had surgery around 10 years ago and has had trouble with her left foot again over the last couple years and has always had structural issues with her right foot. Also has a history of CPPD which causes probable swelling of her digital joints   Review of Systems  All other systems reviewed and are negative.      Objective:   Physical Exam  Constitutional: She is oriented to person, place, and time.  Cardiovascular: Intact distal pulses.   Musculoskeletal: Normal range of motion.  Neurological: She is oriented to person, place, and time.  Skin: Skin is warm.  Nursing note and vitals reviewed.  neurovascular status intact muscle strength adequate range of motion within normal limits with patient found to have healed surgical site left first metatarsal left second and third metatarsals. There is mild residual bunion deformity left that there is more hallux interphalangeal deformity left which is pushing against the second toe causing elevation of the significant nature left second digit and plantar flexion of the left third digit with possible nerve compromise. There is also abnormal appearance of the fourth toe left and distal keratotic lesion digit 5 left. Right foot shows structural bunion deformity with rigid contracture with large joint inflammation second digit and digital contracture third digit right foot and fifth digit right foot     Assessment:     Previous surgery left with mild residual structural deformity left hallux interphalangeus deformity left and digital deformity digits 234 and 5 left digits 235 right distal on the fifth digit bilateral    Plan:     H&P and all conditions reviewed. I did go over at great length the alignment of the first metatarsal and hallux and that clinically it is truly just lateral deviation of the hallux against  her the problem against the second toe. I do think we could consider aggressive Akin-type osteotomy left hallux along with digital fusion digits 234 and distal arthritic digits 5 left as a way to give her relief of the pain that she experiences on a daily basis. She's tried padding she's tried buttress pad and she's tried shoe gear modifications without relief. For the right foot I do think structural bunion correction along with possible Akin osteotomy and digital fusion digits 23 will be necessary. She wants to get the left foot done and we will schedule her for surgery in January and she'll reappoint for consultation  X-ray report indicates elevation of the intermetatarsal angle bilateral with previous structural bunionectomy left shortening osteotomy second metatarsal left with screws that are still intact and severe digital rotation digits 2345 left digits 235 right with significant arthritis of the interphalangeal joint digit 3 left digit 2 right

## 2016-04-19 ENCOUNTER — Telehealth: Payer: Self-pay | Admitting: Rheumatology

## 2016-04-19 NOTE — Telephone Encounter (Signed)
Have advised patient of his number.

## 2016-04-19 NOTE — Telephone Encounter (Signed)
Dr Cyril Mourning / will call her to advise

## 2016-04-19 NOTE — Telephone Encounter (Signed)
Patient would like to know the name of the Doctor at [facility] that Dr. Estanislado Pandy recommended to reconstruct her foot. Please advise. Patient states it was mentioned at her last appointment in February.

## 2016-05-19 DIAGNOSIS — M2012 Hallux valgus (acquired), left foot: Secondary | ICD-10-CM | POA: Insufficient documentation

## 2016-06-07 ENCOUNTER — Ambulatory Visit (INDEPENDENT_AMBULATORY_CARE_PROVIDER_SITE_OTHER): Payer: PRIVATE HEALTH INSURANCE | Admitting: Podiatry

## 2016-06-07 ENCOUNTER — Encounter: Payer: Self-pay | Admitting: Podiatry

## 2016-06-07 DIAGNOSIS — M2040 Other hammer toe(s) (acquired), unspecified foot: Secondary | ICD-10-CM

## 2016-06-07 DIAGNOSIS — M21619 Bunion of unspecified foot: Secondary | ICD-10-CM | POA: Diagnosis not present

## 2016-06-07 NOTE — Patient Instructions (Signed)

## 2016-06-07 NOTE — Progress Notes (Signed)
   Subjective:    Patient ID: [name], female    DOB: [birth year], 61 y.o.   MRN: [id number]  HPI    Review of Systems     Objective:   Physical Exam        Assessment & Plan:

## 2016-06-08 NOTE — Progress Notes (Signed)
Subjective:     Patient ID: [name], female   DOB: November 14, 1955, 61 y.o.   MRN: [id number]  HPI patient presents stating that she's ready to get this left foot fixed after having had failed surgery a number of years ago with a chronic discomfort in the forefoot and lack of feeling in the third digit with chronic plantar flexion of the toe   Review of Systems     Objective:   Physical Exam Neurovascular status intact muscle strength adequate range of motion within normal limits with patient found to have significant distal rotation of the left big toe and no pain around the first metatarsal head itself elevation and deviation of the second third fourth digits left and distal deviation of the fifth toe left foot    Assessment:     Chronic structural changes with history of having had osteotomy surgery in the past along with first metatarsal osteotomy    Plan:     H&P and all conditions reviewed. I did explain that revisional surgery is more difficult and there is no guarantees as to what we will be able to do but I do feel like if we can stabilize the lesser digits and get the big toe off the second toe it we'll achieve benefit for her. I allowed her to read consent form going over alternative treatments complications and she understands all of this wants surgery and after extensive review signs consent form. I again reiterated that there is no guarantee as to what we will be able to do but I'm hoping that she will have a much more functional procedure after surgery and I did dispense air fracture walker at this time and discussed that recovery can take upwards of 6 months for these types of procedures

## 2016-06-09 ENCOUNTER — Telehealth: Payer: Self-pay | Admitting: *Deleted

## 2016-06-09 NOTE — Telephone Encounter (Addendum)
Pt state she had a small concern about the middle toe. I offered pt an appt to come in the Monday before her surgery, and pt refused. She states she would like Dr. Paulla Dolly to call discuss with her the middle toe tip that he had said he would not fuse, but she would like to revisit that procedure again. 06/15/2016-Pt states she missed the surgery call yesterday and has some questions. I spoke with pt and she asked how long she had to use the crutches, and I told her that she was able to weight bear for 15 mins/hour, so the crutches were to be used to assist in balance and stepping quickly from the surgical foot. Pt states her husband will be out of town starting Saturday and she wanted to know if she could drive. I told pt in [place] it was against regulation to drive when wearing a splint, brace or boot, but if she was not on narcotic pain medication and the surgery was on the left foot and she didn't have a clutch she could drive short distance to doctor appt. Pt states she also felt as if the big toe that was to be corrected is continuing to point to the center of her foot.  I told pt with the dressing and boot it may look like that, but x-rays and removal of the dressing would change how it appeared and felt. Pt states she is having pressure to the ball of her foot. I told pt she did have swelling and lots of padding there and if not painful it was normal. 07/12/2016-Pt states she is having to pull the 2nd toe down with gauze and tuck into the ace wrap on the bottom of her foot, now she has a rubbed area and split on the big toe that is not bleeding. I told her to use an antibiotic ointment, and put the gauze at the middle of the 2nd toe and pull the remaining guaze to the bottom of the foot. 08/18/2016-Pt states she and Dr. Paulla Dolly discussed taking out a stitch that didn't come out, but forgot to take the stitch out, and is tender when pulled, does she need to come in. I told pt that if the area of the 5th toe around the  stitch is red, cover with a lightly coated antibiotic ointment bandaid, or if just catching on stuff cover with bandaid to protect and I would transfer to schedulers tocome in on 08/21/2016. Pt asked would it be okay to wait til appt in 1 1/2 weeks and I told her yes, but to call if she did need to come in.

## 2016-06-13 DIAGNOSIS — M2042 Other hammer toe(s) (acquired), left foot: Secondary | ICD-10-CM | POA: Diagnosis not present

## 2016-06-13 DIAGNOSIS — M2012 Hallux valgus (acquired), left foot: Secondary | ICD-10-CM | POA: Diagnosis not present

## 2016-06-14 ENCOUNTER — Telehealth: Payer: Self-pay

## 2016-06-14 NOTE — Telephone Encounter (Signed)
LVM for pt to call with questions or concerns regarding post op status

## 2016-06-21 ENCOUNTER — Other Ambulatory Visit: Payer: PRIVATE HEALTH INSURANCE

## 2016-06-22 ENCOUNTER — Other Ambulatory Visit: Payer: PRIVATE HEALTH INSURANCE

## 2016-06-23 ENCOUNTER — Ambulatory Visit (INDEPENDENT_AMBULATORY_CARE_PROVIDER_SITE_OTHER): Payer: PRIVATE HEALTH INSURANCE

## 2016-06-23 ENCOUNTER — Ambulatory Visit (INDEPENDENT_AMBULATORY_CARE_PROVIDER_SITE_OTHER): Payer: PRIVATE HEALTH INSURANCE | Admitting: Podiatry

## 2016-06-23 DIAGNOSIS — M2040 Other hammer toe(s) (acquired), unspecified foot: Secondary | ICD-10-CM

## 2016-06-23 DIAGNOSIS — M21619 Bunion of unspecified foot: Secondary | ICD-10-CM

## 2016-06-25 NOTE — Progress Notes (Signed)
Subjective:     Patient ID: [name], female   DOB: [birth year], 61 y.o.   MRN: [id number]  HPI patient states her left foot is doing well and that she's very happy so far with her recovery   Review of Systems     Objective:   Physical Exam Neurovascular status intact with patient found to have dressing intact left and upon removal excellent alignment of the lesser digits with pins in place big toe in good place with wound edges well coapted and no drainage    Assessment:     Doing well post forefoot reconstruction left    Plan:     H&P x-rays reviewed and reapplied sterile dressings to hold the toes in proper alignment. Patient so far is doing very well and I'm very pleased with the separation of the digits and I'm quite confident that this will give her an excellent long-term result but I do want continued immobilization continued elevation compression  X-rays indicate excellent alignment of the digit pins in place good reduction of the intermetatarsal angle with hallux in rectus position

## 2016-07-04 ENCOUNTER — Encounter: Payer: Self-pay | Admitting: Podiatry

## 2016-07-05 ENCOUNTER — Ambulatory Visit (INDEPENDENT_AMBULATORY_CARE_PROVIDER_SITE_OTHER): Payer: Self-pay | Admitting: Podiatry

## 2016-07-05 ENCOUNTER — Ambulatory Visit (INDEPENDENT_AMBULATORY_CARE_PROVIDER_SITE_OTHER): Payer: PRIVATE HEALTH INSURANCE

## 2016-07-05 DIAGNOSIS — M21619 Bunion of unspecified foot: Secondary | ICD-10-CM

## 2016-07-05 DIAGNOSIS — M2040 Other hammer toe(s) (acquired), unspecified foot: Secondary | ICD-10-CM

## 2016-07-06 NOTE — Progress Notes (Signed)
Subjective:     Patient ID: [name], female   DOB: 1956-01-21, 61 y.o.   MRN: [id number]  HPI patient states that she's doing well   Review of Systems     Objective:   Physical Exam Neurovascular status intact negative Homans sign noted digits and good alignment pins intact with straight digits 234 with slight elevation of the second digit    Assessment:     Doing well post forefoot reconstruction left    Plan:     X-ray reviewed and sterile dressing reapplied stitches removed with wound edges coapted well and see back in 3 weeks for pin removal or earlier if needed  X-ray report indicated good healing of the site with pins in place and osteotomy and good condition of the hallux

## 2016-07-19 ENCOUNTER — Ambulatory Visit (INDEPENDENT_AMBULATORY_CARE_PROVIDER_SITE_OTHER): Payer: PRIVATE HEALTH INSURANCE | Admitting: Podiatry

## 2016-07-19 ENCOUNTER — Ambulatory Visit (INDEPENDENT_AMBULATORY_CARE_PROVIDER_SITE_OTHER): Payer: PRIVATE HEALTH INSURANCE

## 2016-07-19 DIAGNOSIS — M21619 Bunion of unspecified foot: Secondary | ICD-10-CM | POA: Diagnosis not present

## 2016-07-19 DIAGNOSIS — M2040 Other hammer toe(s) (acquired), unspecified foot: Secondary | ICD-10-CM

## 2016-07-20 NOTE — Progress Notes (Signed)
Subjective:     Patient ID: [name], female   DOB: [birth year], 61 y.o.   MRN: [id number]  HPI patient states I'm doing well with good alignment of the left foot with pins in place and slightly concerned because of elevation second toe   Review of Systems     Objective:   Physical Exam Neurovascular status intact negative Homans sign noted with pins in place second third fourth digits left with wound edges well coapted and mild edema in the left hallux    Assessment:     Doing well post forefoot reconstruction left    Plan:     Pins removed second third fourth digit apply dressings to the end of the toes and dispensed a above ankle BioSkin brace to control edema and also to keep the toes and right digital alignment with instructions given on positional component. Patient had x-rays reviewed will be seen back 4 weeks or earlier if needed and will gradually start shoe gear usage at this time  X-ray report indicates that there is good alignment of the digits with everything intact and no indications of pathology

## 2016-07-28 ENCOUNTER — Ambulatory Visit (INDEPENDENT_AMBULATORY_CARE_PROVIDER_SITE_OTHER): Payer: Self-pay | Admitting: Podiatry

## 2016-07-28 ENCOUNTER — Encounter: Payer: Self-pay | Admitting: Podiatry

## 2016-07-28 VITALS — BP 112/74 | HR 76 | Resp 16

## 2016-07-28 DIAGNOSIS — M21619 Bunion of unspecified foot: Secondary | ICD-10-CM

## 2016-07-28 DIAGNOSIS — M2040 Other hammer toe(s) (acquired), unspecified foot: Secondary | ICD-10-CM

## 2016-07-29 NOTE — Progress Notes (Signed)
Subjective:     Patient ID: [name], female   DOB: September 29, 1955, 61 y.o.   MRN: [id number]  HPI patient presents stating that she's been getting some shooting pains in her big toe and she just wanted to get it checked   Review of Systems     Objective:   Physical Exam Neurovascular status intact negative Homans sign noted all wound edges coapted well with excellent alignment of the lesser digits and only mild edema in the left hallux    Assessment:     Possibility for nail irritation of the lateral border versus just inflammatory process with no indications of acute symptoms    Plan:     Advised on continuation of same treatment and this should reduce and it's possible need to remove the lateral corner the left hallux remains symptomatic but she will begin soaks at this time

## 2016-08-16 ENCOUNTER — Ambulatory Visit (INDEPENDENT_AMBULATORY_CARE_PROVIDER_SITE_OTHER): Payer: PRIVATE HEALTH INSURANCE

## 2016-08-16 ENCOUNTER — Ambulatory Visit (INDEPENDENT_AMBULATORY_CARE_PROVIDER_SITE_OTHER): Payer: PRIVATE HEALTH INSURANCE | Admitting: Podiatry

## 2016-08-16 DIAGNOSIS — D3610 Benign neoplasm of peripheral nerves and autonomic nervous system, unspecified: Secondary | ICD-10-CM | POA: Diagnosis not present

## 2016-08-16 DIAGNOSIS — M2040 Other hammer toe(s) (acquired), unspecified foot: Secondary | ICD-10-CM

## 2016-08-16 DIAGNOSIS — M21619 Bunion of unspecified foot: Secondary | ICD-10-CM | POA: Diagnosis not present

## 2016-08-16 NOTE — Patient Instructions (Signed)
Morton Neuralgia Morton neuralgia is a type of foot pain in the area closest to your toes. This area is sometimes called the ball of your foot. Morton neuralgia occurs when a branch of a nerve in your foot (digital nerve) becomes compressed. When this happens over a long period of time, the nerve can thicken (neuroma) and cause pain. This usually occurs between the third and fourth toe. Morton neuralgia can come and go but may get worse over time. What are the causes? Your digital nerve can become compressed and stretched at a point where it passes under a thick band of tissue that connects your toes (intermetatarsal ligament). Morton neuralgia can be caused by mild repetitive damage in this area. This type of damage can result from:  Activities such as running or jumping.  Wearing shoes that are too tight. What increases the risk? You may be at risk for Morton neuralgia if you:  Are female.  Wear high heels.  Wear shoes that are narrow or tight.  Participate in activities that stretch your toes. These include:  Running.  Ballet.  Long-distance walking. What are the signs or symptoms? The first symptom of Morton neuralgia is pain that spreads from the ball of your foot to your toes. It may feel like you are walking on a marble. Pain usually gets worse with walking and goes away at night. Other symptoms may include numbness and cramping of your toes. How is this diagnosed? Your health care provider will do a physical exam. When doing the exam, your health care provider may:  Squeeze your foot just behind your toe.  Ask you to move your toes to check for pain. You may also have tests on your foot to confirm the diagnosis. These may include:  An X-ray.  An MRI. How is this treated? Treatment for Morton neuralgia may be as simple as changing the kind of shoes you wear. Other treatments may include:  Wearing a supportive pad (orthosis) under the front of your foot. This lifts your  toe bones and takes pressure off the nerve.  Getting injections of numbing medicine and anti-inflammatory medicine (steroid) in the nerve.  Having surgery to remove part of the thickened nerve. Follow these instructions at home:  Take medicine only as directed by your health care provider.  Wear soft-soled shoes with a wide toe area.  Stop activities that may be causing pain.  Elevate your foot when resting.  Massage your foot.  Apply ice to the injured area:  Put ice in a plastic bag.  Place a towel between your skin and the bag.  Leave the ice on for 20 minutes, 2-3 times a day.  Keep all follow-up visits as directed by your health care provider. This is important. Contact a health care provider if:  Home care instructions are not helping you get better.  Your symptoms change or get worse. This information is not intended to replace advice given to you by your health care provider. Make sure you discuss any questions you have with your health care provider. Document Released: 08/28/2000 Document Revised: 10/28/2015 Document Reviewed: 07/23/2013 Elsevier Interactive Patient Education  2017 [employer].

## 2016-08-17 NOTE — Progress Notes (Signed)
Subjective:     Patient ID: [name], female   DOB: Sep 23, 1955, 61 y.o.   MRN: [id number]  HPI patient states she's doing pretty well but she's been getting some swelling in her fourth toe and some pain with shooting discomfort between the second and third toes left foot. The toes are in relatively good position but not perfect and she is concerned   Review of Systems     Objective:   Physical Exam Neurovascular status intact muscle strength was adequate with patient found to have mild to moderate edema in the distal portion digit 4 left with discomfort in the second intermetatarsal space left with mild discomfort around the metatarsal phalangeal joints. The pressure on the metatarsophalangeal joint is significantly diminished secondary to better still digital structure and I also noted mild elevation of the second toe    Assessment:     Very difficult condition due to the preoperative condition of her forefoot with overall good position but pain with possible neuroma symptomatology or possible inflammatory condition    Plan:     H&P x-rays reviewed conditions discussed at great length. I do think the fourth toe will be okay but could possibly require distal procedure one point in future. Today him in a focus on the nerve and I did do a proximal nerve block and I then injected with sterile technique a purified alcohol Marcaine solution second interspace left and advised on wider shoes and reappoint 2 weeks. Patient will be seen back at this time and we may have to consider further treatments or possible orthotics depending on response good healing of the digit hallux secondary to procedure  X-rays indicate that there is relatively good alignment second third and fourth toe with

## 2016-08-30 ENCOUNTER — Encounter: Payer: PRIVATE HEALTH INSURANCE | Admitting: Podiatry

## 2016-08-31 ENCOUNTER — Ambulatory Visit (INDEPENDENT_AMBULATORY_CARE_PROVIDER_SITE_OTHER): Payer: PRIVATE HEALTH INSURANCE | Admitting: Podiatry

## 2016-08-31 ENCOUNTER — Encounter: Payer: Self-pay | Admitting: Podiatry

## 2016-08-31 DIAGNOSIS — M79672 Pain in left foot: Secondary | ICD-10-CM

## 2016-08-31 DIAGNOSIS — M79671 Pain in right foot: Secondary | ICD-10-CM

## 2016-08-31 DIAGNOSIS — D3610 Benign neoplasm of peripheral nerves and autonomic nervous system, unspecified: Secondary | ICD-10-CM | POA: Diagnosis not present

## 2016-09-01 NOTE — Progress Notes (Signed)
Subjective:     Patient ID: [name], female   DOB: August 01, 1955, 61 y.o.   MRN: [id number]  HPI patient presents stating her foot is feeling much better and the previous injection seemed to help and she like to continue that course   Review of Systems     Objective:   Physical Exam Neurovascular status intact with patient's digits in reasonably good alignment secondary to surgery with severe preoperative deformity. Patient second interspace left does have pain in it with what appears to be a positive Biagio Borg sign    Assessment:     Probability for neuroma second interspace left    Plan:     I did a sterile prep of the left foot and then injected directly into the nerve root prior to breaking into digital branches with a purified alcohol Marcaine solution 1.5 mL which was tolerated well

## 2016-10-19 ENCOUNTER — Ambulatory Visit (INDEPENDENT_AMBULATORY_CARE_PROVIDER_SITE_OTHER): Payer: PRIVATE HEALTH INSURANCE | Admitting: Podiatry

## 2016-10-19 DIAGNOSIS — D3610 Benign neoplasm of peripheral nerves and autonomic nervous system, unspecified: Secondary | ICD-10-CM

## 2016-10-23 NOTE — Progress Notes (Signed)
Subjective:    Patient ID: [name], female   DOB: 61 y.o.   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

## 2016-12-18 ENCOUNTER — Telehealth: Payer: Self-pay | Admitting: Podiatry

## 2016-12-18 ENCOUNTER — Ambulatory Visit (INDEPENDENT_AMBULATORY_CARE_PROVIDER_SITE_OTHER): Payer: PRIVATE HEALTH INSURANCE | Admitting: Internal Medicine

## 2016-12-18 ENCOUNTER — Encounter: Payer: Self-pay | Admitting: Internal Medicine

## 2016-12-18 ENCOUNTER — Other Ambulatory Visit: Payer: Self-pay | Admitting: Obstetrics & Gynecology

## 2016-12-18 VITALS — BP 112/78 | HR 68 | Temp 98.0°F | Wt 149.0 lb

## 2016-12-18 DIAGNOSIS — H6690 Otitis media, unspecified, unspecified ear: Secondary | ICD-10-CM | POA: Diagnosis not present

## 2016-12-18 DIAGNOSIS — H6693 Otitis media, unspecified, bilateral: Secondary | ICD-10-CM | POA: Diagnosis not present

## 2016-12-18 DIAGNOSIS — Z1231 Encounter for screening mammogram for malignant neoplasm of breast: Secondary | ICD-10-CM

## 2016-12-18 MED ORDER — METHYLPREDNISOLONE ACETATE 80 MG/ML IJ SUSP
80.0000 mg | Freq: Once | INTRAMUSCULAR | Status: AC
  Administered 2016-12-18: 80 mg via INTRAMUSCULAR

## 2016-12-18 MED ORDER — AZITHROMYCIN 250 MG PO TABS: ORAL_TABLET | ORAL | 0 refills | Status: DC

## 2016-12-18 MED ORDER — FLUCONAZOLE 150 MG PO TABS: 150.0000 mg | ORAL_TABLET | Freq: Once | ORAL | 1 refills | Status: AC

## 2016-12-18 NOTE — Telephone Encounter (Signed)
Pt was scheduled to see The nurse on 7.19.18 due to  A nylon stitch was still in little toe. Pt was concerned that after 5 months why was there a stitch still in there. Pt canceled appt states stitch came out on its own.

## 2016-12-18 NOTE — Progress Notes (Signed)
   Subjective:    Patient ID: [name], female    DOB: Sep 07, 1955, 61 y.o.   MRN: [id number]  HPI 61 year old Female who has had about a 3 week history of respiratory symptoms. Has felt some ear congestion and some pain behind both ears in the mastoid area. She has a history of cervical disc disease. She's had some neck pain but has been falling asleep in a chair at night while reading. Has been traveling a lot on airplanes. Has had some sore throat at onset of illness. No fever or shaking chills. Has had some nasal congestion as well in addition to the ear pain. No significant cough.  History of intermittent leukopenia seen by Dr. Jana Hakim  in August 2017. He looked at her peripheral smear and felt it was normal. Please see his dictated note which is very thorough.  She indicated she might get a physical examination later this year.    Review of Systems     Objective:   Physical Exam Left TM slightly full and dull. Right TM with splayed light reflex. Pharynx very slightly injected. Neck is supple. Chest clear to auscultation. Cervical No adenopathy.       Assessment & Plan:  Otitis media  Plan: Depo-Medrol 80 mg IM. Zithromax Z-PAK take 2 tablets day 1 followed by 1 tablet days 2 through 5.

## 2016-12-21 ENCOUNTER — Other Ambulatory Visit: Payer: PRIVATE HEALTH INSURANCE

## 2016-12-27 NOTE — Patient Instructions (Signed)
Take Zithromax Z-PAK as directed. Depo-Medrol 80 mg IM given in office.

## 2016-12-28 ENCOUNTER — Ambulatory Visit (INDEPENDENT_AMBULATORY_CARE_PROVIDER_SITE_OTHER): Payer: PRIVATE HEALTH INSURANCE | Admitting: Internal Medicine

## 2016-12-28 ENCOUNTER — Encounter: Payer: Self-pay | Admitting: Internal Medicine

## 2016-12-28 VITALS — BP 104/78 | HR 61 | Temp 98.3°F | Wt 147.0 lb

## 2016-12-28 DIAGNOSIS — J3489 Other specified disorders of nose and nasal sinuses: Secondary | ICD-10-CM

## 2016-12-28 DIAGNOSIS — G44209 Tension-type headache, unspecified, not intractable: Secondary | ICD-10-CM

## 2016-12-28 MED ORDER — PREDNISONE 10 MG PO TABS: ORAL_TABLET | ORAL | 0 refills | Status: DC

## 2016-12-28 NOTE — Patient Instructions (Signed)
Take DayQuil while flying. Once you return  home, start Sterapred DS 10 mg 6 day dosepak in tapering course as directed over 6 days. Apply ice to neck for 20 minutes once or twice daily.

## 2016-12-28 NOTE — Progress Notes (Signed)
   Subjective:    Patient ID: [name], female    DOB: 05-Apr-1956, 61 y.o.   MRN: [id number]  HPI Still having rhinorrhea but seems to be improved after Depo-Medrol and Zithromax Z-PAK. Ears have improved. A little discomfort in right ear.  Still having occipital headache which she awakens in the morning for about an hour but it goes away.  She is working with a Physiological scientist. Long-standing history of neck pain status post cervical surgery.    Review of Systems see above no fever or chills. No visual disturbance. Going to fly to [facility] for the weekend. Has been taking Sudafed.     Objective:   Physical Exam Both TMs have good light reflexes in her not red. Pharynx is clear. Neck is supple. Chest clear to auscultation. She is tender bilaterally and tight in sternocleidomastoid muscles.       Assessment & Plan:  No evidence of otitis media  Allergic rhinitis  Muscle contraction headache  Plan: Would like her to try DayQuil for her trip to [place]. She is worried about ear congestion with flying. After she returns from [place] would like for her to try Sterapred DS 10 mg 6 day dosepak to take in tapering course as directed going from 60 mg to 0 mg over 7 days. Would like for her to try ice to her neck 20 minutes once or twice daily.

## 2017-02-06 ENCOUNTER — Ambulatory Visit
Admission: RE | Admit: 2017-02-06 | Discharge: 2017-02-06 | Disposition: A | Discharge status: Home / Self Care | Payer: PRIVATE HEALTH INSURANCE | Source: Ambulatory Visit | Attending: Obstetrics & Gynecology | Admitting: Obstetrics & Gynecology

## 2017-02-06 DIAGNOSIS — Z1231 Encounter for screening mammogram for malignant neoplasm of breast: Secondary | ICD-10-CM

## 2017-02-08 ENCOUNTER — Ambulatory Visit: Payer: PRIVATE HEALTH INSURANCE | Admitting: Internal Medicine

## 2017-03-06 ENCOUNTER — Other Ambulatory Visit: Payer: PRIVATE HEALTH INSURANCE | Admitting: Internal Medicine

## 2017-03-06 DIAGNOSIS — Z1329 Encounter for screening for other suspected endocrine disorder: Secondary | ICD-10-CM

## 2017-03-06 DIAGNOSIS — J309 Allergic rhinitis, unspecified: Secondary | ICD-10-CM

## 2017-03-06 DIAGNOSIS — Z8669 Personal history of other diseases of the nervous system and sense organs: Secondary | ICD-10-CM

## 2017-03-06 DIAGNOSIS — R7989 Other specified abnormal findings of blood chemistry: Secondary | ICD-10-CM

## 2017-03-06 DIAGNOSIS — M1990 Unspecified osteoarthritis, unspecified site: Secondary | ICD-10-CM

## 2017-03-06 DIAGNOSIS — Z0000 Encounter for general adult medical examination without abnormal findings: Secondary | ICD-10-CM

## 2017-03-07 LAB — CBC WITH DIFFERENTIAL/PLATELET
BASOS ABS: 51 {cells}/uL (ref 0–200)
Basophils Relative: 1.6 %
EOS PCT: 5 %
Eosinophils Absolute: 160 cells/uL (ref 15–500)
HCT: 39.8 % (ref 35.0–45.0)
Hemoglobin: 13.2 g/dL (ref 11.7–15.5)
Lymphs Abs: 1590 cells/uL (ref 850–3900)
MCH: 30.6 pg (ref 27.0–33.0)
MCHC: 33.2 g/dL (ref 32.0–36.0)
MCV: 92.1 fL (ref 80.0–100.0)
MONOS PCT: 11.3 %
MPV: 9 fL (ref 7.5–12.5)
NEUTROS PCT: 32.4 %
Neutro Abs: 1037 cells/uL — ABNORMAL LOW (ref 1500–7800)
Platelets: 297 10*3/uL (ref 140–400)
RBC: 4.32 10*6/uL (ref 3.80–5.10)
RDW: 12.8 % (ref 11.0–15.0)
Total Lymphocyte: 49.7 %
WBC mixed population: 362 cells/uL (ref 200–950)
WBC: 3.2 10*3/uL — ABNORMAL LOW (ref 3.8–10.8)

## 2017-03-07 LAB — LIPID PANEL
CHOL/HDL RATIO: 2.3 (calc) (ref <5.0)
CHOLESTEROL: 211 mg/dL — AB (ref <200)
HDL: 93 mg/dL (ref >50)
LDL Cholesterol (Calc): 104 mg/dL (calc) — ABNORMAL HIGH
Non-HDL Cholesterol (Calc): 118 mg/dL (calc) (ref <130)
Triglycerides: 46 mg/dL (ref <150)

## 2017-03-07 LAB — COMPLETE METABOLIC PANEL WITH GFR
AG Ratio: 1.7 (calc) (ref 1.0–2.5)
ALKALINE PHOSPHATASE (APISO): 65 U/L (ref 33–130)
ALT: 14 U/L (ref 6–29)
AST: 21 U/L (ref 10–35)
Albumin: 4.1 g/dL (ref 3.6–5.1)
BUN: 13 mg/dL (ref 7–25)
CO2: 27 mmol/L (ref 20–32)
CREATININE: 0.84 mg/dL (ref 0.50–1.05)
Calcium: 9.3 mg/dL (ref 8.6–10.4)
Chloride: 107 mmol/L (ref 98–110)
GFR, Est African American: 88 mL/min/{1.73_m2} (ref >=60)
GFR, Est Non African American: 76 mL/min/{1.73_m2} (ref >=60)
GLOBULIN: 2.4 g/dL (ref 1.9–3.7)
Glucose, Bld: 89 mg/dL (ref 65–99)
Potassium: 4.3 mmol/L (ref 3.5–5.3)
SODIUM: 141 mmol/L (ref 135–146)
Total Bilirubin: 0.6 mg/dL (ref 0.2–1.2)
Total Protein: 6.5 g/dL (ref 6.1–8.1)

## 2017-03-07 LAB — VITAMIN D 25 HYDROXY (VIT D DEFICIENCY, FRACTURES): Vit D, 25-Hydroxy: 40 ng/mL (ref 30–100)

## 2017-03-07 LAB — TSH: TSH: 0.97 mIU/L (ref 0.40–4.50)

## 2017-03-08 ENCOUNTER — Encounter: Payer: Self-pay | Admitting: Internal Medicine

## 2017-03-08 ENCOUNTER — Ambulatory Visit (INDEPENDENT_AMBULATORY_CARE_PROVIDER_SITE_OTHER): Payer: PRIVATE HEALTH INSURANCE | Admitting: Internal Medicine

## 2017-03-08 VITALS — BP 102/70 | HR 75 | Temp 98.6°F | Ht 63.75 in | Wt 144.0 lb

## 2017-03-08 DIAGNOSIS — M25562 Pain in left knee: Secondary | ICD-10-CM

## 2017-03-08 DIAGNOSIS — D708 Other neutropenia: Secondary | ICD-10-CM | POA: Diagnosis not present

## 2017-03-08 DIAGNOSIS — G8929 Other chronic pain: Secondary | ICD-10-CM | POA: Diagnosis not present

## 2017-03-08 DIAGNOSIS — Z8739 Personal history of other diseases of the musculoskeletal system and connective tissue: Secondary | ICD-10-CM

## 2017-03-08 DIAGNOSIS — Z0000 Encounter for general adult medical examination without abnormal findings: Secondary | ICD-10-CM

## 2017-03-08 DIAGNOSIS — M25561 Pain in right knee: Secondary | ICD-10-CM | POA: Diagnosis not present

## 2017-03-08 DIAGNOSIS — Z8669 Personal history of other diseases of the nervous system and sense organs: Secondary | ICD-10-CM

## 2017-03-08 LAB — POCT URINALYSIS DIPSTICK
BILIRUBIN UA: NEGATIVE
GLUCOSE UA: NEGATIVE
KETONES UA: NEGATIVE
Leukocytes, UA: NEGATIVE
Nitrite, UA: NEGATIVE
PH UA: 7 (ref 5.0–8.0)
Protein, UA: NEGATIVE
RBC UA: NEGATIVE
Spec Grav, UA: 1.025 (ref 1.010–1.025)
Urobilinogen, UA: 0.2 E.U./dL

## 2017-03-08 MED ORDER — ZOLMITRIPTAN 5 MG PO TABS: 5.0000 mg | ORAL_TABLET | ORAL | 1 refills | Status: DC | PRN

## 2017-03-08 MED ORDER — CELECOXIB 200 MG PO CAPS: 200.0000 mg | ORAL_CAPSULE | Freq: Two times a day (BID) | ORAL | 1 refills | Status: DC

## 2017-03-08 NOTE — Progress Notes (Signed)
   Subjective:    Patient ID: [name], female    DOB: [birth year], 61 y.o.   MRN: [id number]  HPI pleasant 61 year old white female in today for health maintenance exam and evaluation of medical issues.  History of calcium pyrophosphate deposition disease (CPPD), migraine headaches, allergic rhinitis.  In 2012 she underwent bilateral knee arthroplasties.  She still has issues with joint pain.  Allergic to grass, pollens, molds.  No known drug allergies.  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  Husband is a Stage manager with [employer].  Adult children daughter and his son.  Non-smoker.  Social alcohol consumption,.  Family history: Father died of aplastic anemia with history of CVA.  Mother with history of subependymoma of the fourth ventricle.  Patient's sister died of lower extremity sarcoma at the age of 39.    Review of Systems  Constitutional: Negative.   Respiratory: Negative.   Cardiovascular: Negative.   Gastrointestinal: Negative.   Genitourinary: Negative.   Musculoskeletal:       Continues with bilateral knee pain.  Some recent neck pain as well  Neurological:       History of migraine headaches  Psychiatric/Behavioral: Negative.        Objective:   Physical Exam  Constitutional: She is oriented to person, place, and time. She appears well-developed and well-nourished. No distress.  HENT:  Head: Normocephalic and atraumatic.  Right Ear: External ear normal.  Left Ear: External ear normal.  Mouth/Throat: Oropharynx is clear and moist.  Eyes: Pupils are equal, round, and reactive to light. Conjunctivae and EOM are normal. Left eye exhibits no discharge. No scleral icterus.  Neck: Neck supple. No JVD present. No thyromegaly present.  Cardiovascular: Normal rate, regular rhythm, normal heart sounds and intact distal pulses.   No murmur heard. Pulmonary/Chest: Effort normal and breath sounds normal. She has no wheezes. She  has no rales.  Abdominal: Soft. Bowel sounds are normal. She exhibits no distension and no mass. There is no tenderness. There is no rebound and no guarding.  Genitourinary:  Genitourinary Comments: Pap deferred to GYN  Musculoskeletal: She exhibits no edema.  Lymphadenopathy:    She has no cervical adenopathy.  Neurological: She is alert and oriented to person, place, and time. She has normal reflexes. No cranial nerve deficit. Coordination normal.  Skin: Skin is warm and dry. No rash noted. She is not diaphoretic.  Psychiatric: She has a normal mood and affect. Her behavior is normal. Judgment and thought content normal.          Assessment & Plan:  History of low white blood cell count that has been previously evaluated.   Her anti-inflammatory medication apparently can cause neutropenia.  She really needs is to manage her bilateral knee pain.  Has seen hematologist regarding this and felt to be a benign issue.  Typically white blood cell count is in the 3000 range just below normal.  Continue to monitor.  CPPD continues to have chronic bilateral knee pain.  Musculoskeletal pain in the neck  History of migraine headaches  Allergic rhinitis  High HDL cholesterol  Is seen Dr. Paulla Dolly regarding hammertoe left second toe and neuroma second interspace left foot.  Return in 1 year or as needed.  Okay to refill medications as needed.

## 2017-03-21 ENCOUNTER — Ambulatory Visit: Payer: PRIVATE HEALTH INSURANCE | Admitting: Internal Medicine

## 2017-04-04 NOTE — Patient Instructions (Signed)
It was a pleasure to see you today.  Continue same medications and return in 1 year or as needed. 

## 2017-05-15 ENCOUNTER — Ambulatory Visit: Payer: PRIVATE HEALTH INSURANCE | Admitting: Internal Medicine

## 2017-05-25 ENCOUNTER — Ambulatory Visit (INDEPENDENT_AMBULATORY_CARE_PROVIDER_SITE_OTHER): Payer: PRIVATE HEALTH INSURANCE | Admitting: Internal Medicine

## 2017-05-25 ENCOUNTER — Encounter: Payer: Self-pay | Admitting: Internal Medicine

## 2017-05-25 VITALS — BP 120/80 | HR 82 | Ht 64.0 in | Wt 148.0 lb

## 2017-05-25 DIAGNOSIS — M810 Age-related osteoporosis without current pathological fracture: Secondary | ICD-10-CM

## 2017-05-25 DIAGNOSIS — Z8739 Personal history of other diseases of the musculoskeletal system and connective tissue: Secondary | ICD-10-CM

## 2017-05-25 NOTE — Progress Notes (Signed)
   Subjective:    Patient ID: [name], female    DOB: 1955-07-30, 61 y.o.   MRN: [id number]  HPI 61 year old Female with history of CPPD on chronic anti-inflammatory medication had bone density study done in GYN physician showing lowest T score -2.8 in the lumbar spine.  T score the right femoral neck was -1.1 in the left femoral neck -0.8.  This of course is consistent with osteoporosis.  She is concerned about treatment options.  She and her husband have talked about this.  Husband thinks she should have endocrinology consultation. Her rheumatologist is Dr. Estanislado Pandy but she has not had to see her in some time.  She has chronic musculoskeletal pain.    Review of Systems     Objective:   Physical Exam  Not examined.  Spent 15 minutes speaking with her about options including oral bisphosphonates, Prolia, Reclast.  I think I agree with her husband that she should have endocrinology consultation.  We will arrange that.      Assessment & Plan:  CPPD  Chronic musculoskeletal pain secondary to #1  Osteoporosis-to see endocrinologist

## 2017-05-25 NOTE — Patient Instructions (Signed)
To see endocrinologist regarding bone density study

## 2017-07-02 ENCOUNTER — Telehealth: Payer: Self-pay | Admitting: Internal Medicine

## 2017-07-02 NOTE — Telephone Encounter (Signed)
Patient is calling regarding her diagnosis of CPPD.  She used to see a rheumatologist, Dr. Patrecia Pour years ago for this.  However, she had to stop because she needed her knees replaced.  Long story short...she has recently been talking with one of her husbands partners.  He also has CPPD and he sees a Merchant navy officer in [place] for this.  She called their office but they will only see her if she's referred by her PCP.  She wants to know if you will refer her to Dr. Berenda Morale in [facility], Rheumatologist for her CPPD??  She has a fax # [phone].    Thank you.

## 2017-07-02 NOTE — Telephone Encounter (Signed)
Please make referral

## 2017-07-04 NOTE — Telephone Encounter (Signed)
Faxed demographic and clinical information to Dr. Berenda Morale @ [facility] office @ [phone].  Will await appointment information.    Called patient to let her know that referral has been made to the Clinic.  [phone].

## 2017-08-07 ENCOUNTER — Encounter: Payer: Self-pay | Admitting: Internal Medicine

## 2017-08-07 ENCOUNTER — Ambulatory Visit (INDEPENDENT_AMBULATORY_CARE_PROVIDER_SITE_OTHER): Payer: PRIVATE HEALTH INSURANCE | Admitting: Internal Medicine

## 2017-08-07 VITALS — BP 110/64 | HR 80 | Ht 64.0 in | Wt 150.0 lb

## 2017-08-07 DIAGNOSIS — M810 Age-related osteoporosis without current pathological fracture: Secondary | ICD-10-CM | POA: Diagnosis not present

## 2017-08-07 LAB — VITAMIN D 25 HYDROXY (VIT D DEFICIENCY, FRACTURES): VITD: 54.19 ng/mL (ref 30.00–100.00)

## 2017-08-07 NOTE — Patient Instructions (Addendum)
Please make sure you get 1000-1200 mg calcium daily.  You need ~55 g protein a day.  No more than 2 alcoholic drinks a day.  Let me know if you decide about Prolia.  Please come back for a follow-up appointment in 1 year.  How Can I Prevent Falls? Men and women with osteoporosis need to take care not to fall down. Falls can break bones. Some reasons people fall are: Poor vision  Poor balance  Certain diseases that affect how you walk  Some types of medicine, such as sleeping pills.  Some tips to help prevent falls outdoors are: Use a cane or walker  Wear rubber-soled shoes so you don't slip  Walk on grass when sidewalks are slippery  In winter, put salt or kitty litter on icy sidewalks.  Some ways to help prevent falls indoors are: Keep rooms free of clutter, especially on floors  Use plastic or carpet runners on slippery floors  Wear low-heeled shoes that provide good support  Do not walk in socks, stockings, or slippers  Be sure carpets and area rugs have skid-proof backs or are tacked to the floor  Be sure stairs are well lit and have rails on both sides  Put grab bars on bathroom walls near tub, shower, and toilet  Use a rubber bath mat in the shower or tub  Keep a flashlight next to your bed  Use a sturdy step stool with a handrail and wide steps  Add more lights in rooms (and night lights) Buy a cordless phone to keep with you so that you don't have to rush to the phone       when it rings and so that you can call for help if you fall.   (adapted from http://www.niams.[employer]ess[employer].at)  Please check out the following book about best diet for bone health:   Exercise for Strong Bones (from [facility]) There are two types of exercises that are important for building and maintaining bone density:  weight-bearing and muscle-strengthening exercises. Weight-bearing Exercises These exercises include activities  that make you move against gravity while staying upright. Weight-bearing exercises can be high-impact or low-impact. High-impact weight-bearing exercises help build bones and keep them strong. If you have broken a bone due to osteoporosis or are at risk of breaking a bone, you may need to avoid high-impact exercises. If you're not sure, you should check with your healthcare provider. Examples of high-impact weight-bearing exercises are: . Dancing . Doing high-impact aerobics . Hiking . Jogging/running . Jumping Rope . Stair climbing . Tennis Low-impact weight-bearing exercises can also help keep bones strong and are a safe alternative if you cannot do high-impact exercises. Examples of low-impact weight-bearing exercises are: . Using elliptical training machines . Doing low-impact aerobics . Using stair-step machines . Fast walking on a treadmill or outside Muscle-Strengthening Exercises These exercises include activities where you move your body, a weight or some other resistance against gravity. They are also known as resistance exercises and include: . Lifting weights . Using elastic exercise bands . Using weight machines . Lifting your own body weight . Functional movements, such as standing and rising up on your toes Yoga and Pilates can also improve strength, balance and flexibility. However, certain positions may not be safe for people with osteoporosis or those at increased risk of broken bones. For example, exercises that have you bend forward may increase the chance of breaking a bone in the spine. A physical therapist should be able to help  you learn which exercises are safe and appropriate for you. Non-Impact Exercises Non-impact exercises can help you to improve balance, posture and how well you move in everyday activities. These exercises can also help to increase muscle strength and decrease the risk of falls and broken bones. Some of these exercises include: . Balance  exercises that strengthen your legs and test your balance, such as Tai Chi, can decrease your risk of falls. . Posture exercises that improve your posture and reduce rounded or "sloping" shoulders can help you decrease the chance of breaking a bone, especially in the spine. . Functional exercises that improve how well you move can help you with everyday activities and decrease your chance of falling and breaking a bone. For example, if you have trouble getting up from a chair or climbing stairs, you should do these activities as exercises. A physical therapist can teach you balance, posture and functional exercises. Starting a New Exercise Program If you haven't exercised regularly for a while, check with your healthcare provider before beginning a new exercise program--particularly if you have health problems such as heart disease, diabetes or high blood pressure. If you're at high risk of breaking a bone, you should work with a physical therapist to develop a safe exercise program. Once you have your healthcare provider's approval, start slowly. If you've already broken bones in the spine because of osteoporosis, be very careful to avoid activities that require reaching down, bending forward, rapid twisting motions, heavy lifting and those that increase your chance of a fall. As you get started, your muscles may feel sore for a day or two after you exercise. If soreness lasts longer, you may be working too hard and need to ease up. Exercises should be done in a pain-free range of motion. How Much Exercise Do You Need? Weight-bearing exercises 30 minutes on most days of the week. Do a 30-minutesession or multiple sessions spread out throughout the day. The benefits to your bones are the same.   Muscle-strengthening exercises Two to three days per week. If you don't have much time for strengthening/resistance training, do small amounts at a time. You can do just one body part each day. For example do arms  one day, legs the next and trunk the next. You can also spread these exercises out during your normal day.  Balance, posture and functional exercises Every day or as often as needed. You may want to focus on one area more than the others. If you have fallen or lose your balance, spend time doing balance exercises. If you are getting rounded shoulders, work more on posture exercises. If you have trouble climbing stairs or getting up from the couch, do more functional exercises. You can also perform these exercises at one time or spread them during your day. Work with a phyiscal therapist to learn the right exercises for you.    Denosumab: Patient drug information (Up-to-date) Copyright 816 [id number] 1581 [employer] All rights reserved.  Brand Names: [place]  ProliaRivka Barbara What is this drug used for?  .It is used to treat soft, brittle bones (osteoporosis).  .It is used for bone growth.  .It is used when treating some cancers.  .It may be given to you for other reasons. Talk with the doctor. What do I need to tell my doctor BEFORE I take this drug?  All products:  .If you have an allergy to denosumab or any other part of this drug.  .If you are allergic to any drugs like  this one, any other drugs, foods, or other substances. Tell your doctor about the allergy and what signs you had, like rash; hives; itching; shortness of breath; wheezing; cough; swelling of face, lips, tongue, or throat; or any other signs.  .If you have low calcium levels.  ProliaT:  .If you are pregnant or may be pregnant. Do not take this drug if you are pregnant.  This is not a list of all drugs or health problems that interact with this drug.  Tell your doctor and pharmacist about all of your drugs (prescription or OTC, natural products, vitamins) and health problems. You must check to make sure that it is safe for you to take this drug with all of your drugs and health problems. Do not start, stop, or change the dose of any drug  without checking with your doctor. What are some things I need to know or do while I take this drug?  All products:  .Tell dentists, surgeons, and other doctors that you use this drug.  .This drug may raise the chance of a broken leg. Talk with your doctor.  .Have your blood work checked. Talk with your doctor.  .Have a bone density test. Talk with your doctor.  .Take calcium and vitamin D as you were told by your doctor.  .Have a dental exam before starting this drug.  .Take good care of your teeth. See a dentist often.  .If you smoke, talk with your doctor.  .Do not give to a child. Talk with your doctor.  .Tell your doctor if you are breast-feeding. You will need to talk about any risks to your baby.  Xgeva:  .This drug may cause harm to the unborn baby if you take it while you are pregnant. If you get pregnant while taking this drug, call your doctor right away.  ProliaT:  .Very bad infections have been reported with use of this drug. If you have any infection, are taking antibiotics now or in the recent past, or have many infections, talk with your doctor.  .You may have more chance of getting an infection. Wash hands often. Stay away from people with infections, colds, or flu.  .Use birth control that you can trust to prevent pregnancy while taking this drug.  .If you are a man and your sex partner is pregnant or gets pregnant at any time while you are being treated, talk with your doctor. What are some side effects that I need to call my doctor about right away?  WARNING/CAUTION: Even though it may be rare, some people may have very bad and sometimes deadly side effects when taking a drug. Tell your doctor or get medical help right away if you have any of the following signs or symptoms that may be related to a very bad side effect:  All products:  .Signs of an allergic reaction, like rash; hives; itching; red, swollen, blistered, or peeling skin with or without fever; wheezing;  tightness in the chest or throat; trouble breathing or talking; unusual hoarseness; or swelling of the mouth, face, lips, tongue, or throat.  .Signs of low calcium levels like muscle cramps or spasms, numbness and tingling, or seizures.  .Mouth sores.  .Any new or strange groin, hip, or thigh pain.  .This drug may cause jawbone problems. The chance may be higher the longer you take this drug. The chance may be higher if you have cancer, dental problems, dentures that do not fit well, anemia, blood clotting problems, or an infection. The  chance may also be higher if you are having dental work or if you are getting chemo, some steroid drugs, or radiation. Call your doctor right away if you have jaw swelling or pain.  Xgeva:  .Not hungry.  .Muscle pain or weakness.  .Seizures.  .Shortness of breath.  ProliaT:  .Signs of infection. These include a fever of 100.78F (38C) or higher, chills, very bad sore throat, ear or sinus pain, cough, more sputum or change in color of sputum, pain with passing urine, mouth sores, wound that will not heal, or anal itching or pain.  .Signs of a pancreas problem (pancreatitis) like very bad stomach pain, very bad back pain, or very bad upset stomach or throwing up.  .Chest pain.  .A heartbeat that does not feel normal.  .Very bad skin irritation.  .Feeling very tired or weak.  .Bladder pain or pain when passing urine or change in how much urine is passed.  .Passing urine often.  .Swelling in the arms or legs. What are some other side effects of this drug?  All drugs may cause side effects. However, many people have no side effects or only have minor side effects. Call your doctor or get medical help if any of these side effects or any other side effects bother you or do not go away:  Xgeva:  .Feeling tired or weak.  Marland KitchenHeadache.  Marland KitchenUpset stomach or throwing up.  .Loose stools (diarrhea).  .Cough.  ProliaT:  .Back pain.  .Muscle or joint pain.  .Sore throat.    .Runny nose.  .Pain in arms or legs.  These are not all of the side effects that may occur. If you have questions about side effects, call your doctor. Call your doctor for medical advice about side effects.  You may report side effects to your national health agency. How is this drug best taken?  Use this drug as ordered by your doctor. Read and follow the dosing on the label closely.  .It is given as a shot into the fatty part of the skin. What do I do if I miss a dose?  .Call the doctor to find out what to do. How do I store and/or throw out this drug?  Marland KitchenThis drug will be given to you in a hospital or doctor's office. You will not store it at home.  Marland KitchenKeep all drugs out of the reach of children and pets.  .Check with your pharmacist about how to throw out unused drugs.  General drug facts  .If your symptoms or health problems do not get better or if they become worse, call your doctor.  .Do not share your drugs with others and do not take anyone else's drugs.  Marland KitchenKeep a list of all your drugs (prescription, natural products, vitamins, OTC) with you. Give this list to your doctor.  .Talk with the doctor before starting any new drug, including prescription or OTC, natural products, or vitamins.  .Some drugs may have another patient information leaflet. If you have any questions about this drug, please talk with your doctor, pharmacist, or other health care provider.  .If you think there has been an overdose, call your poison control center or get medical care right away. Be ready to tell or show what was taken, how much, and when it happened.

## 2017-08-07 NOTE — Progress Notes (Signed)
Patient ID: [name], female   DOB: 1956/01/06, 62 y.o.   MRN: [id number]   HPI  [name] is a 62 y.o.-year-old female, referred by her PCP, Dr. Renold Genta, for management of osteoporosis.  Pt was dx with OP in 2018.  I reviewed pt's DEXA scans: Date L1-L4 T score FN T score 33% distal Radius  03/26/2017 ([facility]) -2.8 RFN: -1.1 LFN: -0.8 n/a  06/25/2012 ([facility]) -1.8 LFN: -0.8 n/a   She denies fractures. No dizziness/vertigo/orthostasis/poor vision. No falls.  No previous OP treatments.   + h/o vitamin D deficiency. Reviewed available vit D levels: Lab Results  Component Value Date   VD25OH 40 03/06/2017   VD25OH 38 12/16/2015   VD25OH 36 03/25/2012   VD25OH 25 (L) 01/20/2011   On calcium in [facility] - started Fall 2018; also on vitamin D supplements 2000-3000 IU.   + weight bearing exercises: 30 min 3x a week with a personal trainer: stretching and weight bearing.  She does not take high vitamin A doses.  She has a h/p CPPD. She has a h/o many steroid inj's over the years.  Menopause was at late 60s.   Pt does have a FH of osteoporosis in mother. She had many fractures.  No h/o hyper/hypocalcemia or hyperparathyroidism. + distant h/o kidney stone x1 - 20 years ago. Lab Results  Component Value Date   CALCIUM 9.3 03/06/2017   CALCIUM 9.1 12/16/2015   CALCIUM 9.4 07/20/2013   CALCIUM 9.8 02/04/2013   CALCIUM 9.2 03/25/2012   CALCIUM 9.1 03/28/2011   CALCIUM 8.7 03/26/2011   CALCIUM 8.5 03/25/2011   CALCIUM 8.2 (L) 03/24/2011   CALCIUM 8.3 (L) 03/23/2011   No h/o thyrotoxicosis. Reviewed TSH recent levels:  Lab Results  Component Value Date   TSH 0.97 03/06/2017   TSH 1.51 12/16/2015   TSH 2.172 03/25/2012   TSH 1.413 01/20/2011   No h/o CKD. Last BUN/Cr: Lab Results  Component Value Date   BUN 13 03/06/2017   CREATININE 0.84 03/06/2017   ROS: Constitutional: no weight gain/loss, no fatigue, + hot flushes Eyes: no blurry vision, no  xerophthalmia ENT: no sore throat, no nodules palpated in throat, no dysphagia/odynophagia, no hoarseness Cardiovascular: no CP/SOB/palpitations/leg swelling Respiratory: no cough/SOB Gastrointestinal: no N/V/D/C Musculoskeletal: + both: muscle/joint aches Skin: no rashes Neurological: no tremors/numbness/tingling/dizziness Psychiatric: no depression/anxiety  Past Medical History:  Diagnosis Date  . Allergy   . Arthritis   . Complication of anesthesia   . H/O calcium pyrophosphate deposition disease (CPPD)   . Migraine   . PONV (postoperative nausea and vomiting)    Past Surgical History:  Procedure Laterality Date  . BUNIONECTOMY Left   . CERVICAL DISCECTOMY     C3-4  . EYE SURGERY Bilateral    LASIK  . JOINT REPLACEMENT Bilateral    knees  . KNEE ARTHROSCOPY Left   . KNEE ARTHROSCOPY Right 05/14/2013   Procedure: RIGHT ARTHROSCOPY KNEE;  Surgeon: Gearlean Alf, MD;  Location: [facility];  Service: Orthopedics;  Laterality: Right;   Social History   Socioeconomic History  . Marital status: Married    Spouse name: Not on file  . Number of children: Not on file  . Years of education: Not on file  . Highest education level: Not on file  Social Needs  . Financial resource strain: Not on file  . Food insecurity - worry: Not on file  . Food insecurity - inability: Not on file  . Transportation needs -  medical: Not on file  . Transportation needs - non-medical: Not on file  Occupational History  . Not on file  Tobacco Use  . Smoking status: Never Smoker  . Smokeless tobacco: Never Used  Substance and Sexual Activity  . Alcohol use: Yes    Comment: socially  . Drug use: No  . Sexual activity: Not on file  Other Topics Concern  . Not on file  Social History Narrative  . Not on file   Current Outpatient Medications on File Prior to Visit  Medication Sig Dispense Refill  . celecoxib (CELEBREX) 200 MG capsule Take 1 capsule (200 mg total) by mouth 2 (two) times  daily. 180 capsule 1  . Cholecalciferol (VITAMIN D) 2000 UNITS CAPS Take 2,000 Units by mouth daily.    . Melatonin 5 MG TABS Take 5 mg by mouth daily.    . nitroGLYCERIN (NITROSTAT) 0.4 MG SL tablet Place 1 tablet (0.4 mg total) under the tongue every 5 (five) minutes as needed (for chest or arm pain). 30 tablet 0   No current facility-administered medications on file prior to visit.    No Known Allergies Family History  Problem Relation Age of Onset  . Heart disease Mother     PE: BP 110/64   Pulse 80   Ht 5\' 4"  (1.626 m)   Wt 150 lb (68 kg)   SpO2 98%   BMI 25.75 kg/m  Wt Readings from Last 3 Encounters:  08/07/17 150 lb (68 kg)  05/25/17 148 lb (67.1 kg)  03/08/17 144 lb (65.3 kg)   Constitutional: normal weight, in NAD. No kyphosis. No spine tenderness at light percussion. Eyes: PERRLA, EOMI, no exophthalmos ENT: moist mucous membranes, no thyromegaly, no cervical lymphadenopathy Cardiovascular: RRR, No MRG Respiratory: CTA B Gastrointestinal: abdomen soft, NT, ND, BS+ Musculoskeletal: no deformities, strength intact in all 4 Skin: moist, warm, no rashes Neurological: no tremor with outstretched hands, DTR normal in all 4  Assessment: 1. Osteoporosis  Plan: 1. Osteoporosis - likely postmenopausal, she has FH of OP - Discussed about increased risk of fracture, depending on the T score, greatly increased when the T score is lower than -2.5, but it is actually a continuum and -2.5 should not be regarded as an absolute threshold. We reviewed her DEXA scan report together, and I explained that based on the T scores, she has an increased risk for fractures, as her spine T-score is -2.8. - we reviewed her dietary and supplemental calcium and vitamin D intake. I recommended to make sure she gets 1000-1200 mg of calcium daily preferentially from diet and I will check vit D today to see if she needs further supplementation. - discussed fall precautions   - given handout from  [facility]: weight bearing exercises - advised to do this every day or at least 5/7 days - I also recommended the [facility] - for skeletal loading. Given brochure and explained benefits. - we discussed about maintaining a good amount of protein in her diet. The recommended daily protein intake is ~0.8 g per kilogram per day (~55 g per day). I advised her to try to aim for this amount, since a diet low in proteins can exacerbate osteoporosis. Also, avoid smoking or >2 drinks of alcohol a day. - I recommended the following book:  - We discussed about the different medication classes, benefits and side effects (including atypical fractures and ONJ - no dental workup in progress or planned).  - my first choice  would be sq denosumab (Prolia) for at least 3 years, then zoledronic acid (iv Reclast) for 1-2 years. I would use Teriparatide/Abaloparatide as a last resort. Pt was given reading information about Prolia, and I explained the mechanism of action and expected benefits. She agrees to try this, but she has an appt with rheumatology in 09/2017 >> would want to run this by them. I advise her that this is no pb. - Will check the following labs today: Orders Placed This Encounter  Procedures  . VITAMIN D 25 Hydroxy (Vit-D Deficiency, Fractures)  . BASIC METABOLIC PANEL WITH GFR  - if labs normal, will start a Prolia PA - will check a new DEXA scan in 2 years after starting Prolia -  I explained that the first indication that the treatment is working is her not having anymore fractures. DEXA scan changes are secondary: unchanged or slightly higher T-scores are desirable - will see pt back in a year  Office Visit on 08/07/2017  Component Date Value Ref Range Status  . VITD 08/07/2017 54.19  30.00 - 100.00 ng/mL Final  . Glucose, Bld 08/07/2017 96  65 - 99 mg/dL Final   Comment: .            Fasting reference interval .   . BUN 08/07/2017 15  7 - 25 mg/dL Final  .  Creat 08/07/2017 0.92  0.50 - 0.99 mg/dL Final   Comment: For patients >47 years of age, the reference limit for Creatinine is approximately 13% higher for people identified as African-American. .   . GFR, Est Non African American 08/07/2017 68  > OR = 60 mL/min/1.28m2 Final  . GFR, Est African American 08/07/2017 78  > OR = 60 mL/min/1.81m2 Final  . BUN/Creatinine Ratio 54/98/2641 NOT APPLICABLE  6 - 22 (calc) Final  . Sodium 08/07/2017 140  135 - 146 mmol/L Final  . Potassium 08/07/2017 4.8  3.5 - 5.3 mmol/L Final  . Chloride 08/07/2017 105  98 - 110 mmol/L Final  . CO2 08/07/2017 30  20 - 32 mmol/L Final  . Calcium 08/07/2017 10.2  8.6 - 10.4 mg/dL Final   Labs are normal.  We will start a PA for Prolia.  [name], MD PhD [facility] Endocrinology

## 2017-08-08 ENCOUNTER — Telehealth: Payer: Self-pay

## 2017-08-08 DIAGNOSIS — M810 Age-related osteoporosis without current pathological fracture: Secondary | ICD-10-CM | POA: Insufficient documentation

## 2017-08-08 LAB — BASIC METABOLIC PANEL WITH GFR
BUN: 15 mg/dL (ref 7–25)
CO2: 30 mmol/L (ref 20–32)
CREATININE: 0.92 mg/dL (ref 0.50–0.99)
Calcium: 10.2 mg/dL (ref 8.6–10.4)
Chloride: 105 mmol/L (ref 98–110)
GFR, EST AFRICAN AMERICAN: 78 mL/min/{1.73_m2} (ref >=60)
GFR, Est Non African American: 68 mL/min/{1.73_m2} (ref >=60)
Glucose, Bld: 96 mg/dL (ref 65–99)
Potassium: 4.8 mmol/L (ref 3.5–5.3)
SODIUM: 140 mmol/L (ref 135–146)

## 2017-08-08 NOTE — Telephone Encounter (Signed)
Linus Galas, CMA called pt earlier per result note.

## 2017-08-08 NOTE — Telephone Encounter (Signed)
-----   Message from [name], MD sent at 08/08/2017  7:38 AM EST ----- Larey Seat, can you please call pt: All her labs are normal, including her vitamin D, which is excellent.  Can you please ask [name] to start the process to get her Prolia.  We should not start now, but I want her insurance to approve it before she is ready to started towards the end of April.

## 2017-08-13 ENCOUNTER — Other Ambulatory Visit: Payer: Self-pay | Admitting: Internal Medicine

## 2017-08-16 ENCOUNTER — Ambulatory Visit (INDEPENDENT_AMBULATORY_CARE_PROVIDER_SITE_OTHER): Payer: PRIVATE HEALTH INSURANCE | Admitting: Internal Medicine

## 2017-08-16 ENCOUNTER — Ambulatory Visit: Payer: PRIVATE HEALTH INSURANCE | Admitting: Internal Medicine

## 2017-08-16 ENCOUNTER — Encounter: Payer: Self-pay | Admitting: Internal Medicine

## 2017-08-16 VITALS — BP 140/82 | HR 65 | Temp 98.1°F | Ht 64.0 in | Wt 148.0 lb

## 2017-08-16 DIAGNOSIS — H9201 Otalgia, right ear: Secondary | ICD-10-CM

## 2017-08-16 DIAGNOSIS — M810 Age-related osteoporosis without current pathological fracture: Secondary | ICD-10-CM | POA: Diagnosis not present

## 2017-08-16 DIAGNOSIS — H6501 Acute serous otitis media, right ear: Secondary | ICD-10-CM

## 2017-08-16 DIAGNOSIS — Z8739 Personal history of other diseases of the musculoskeletal system and connective tissue: Secondary | ICD-10-CM

## 2017-08-16 MED ORDER — AZITHROMYCIN 250 MG PO TABS: ORAL_TABLET | ORAL | 0 refills | Status: DC

## 2017-08-16 MED ORDER — METHYLPREDNISOLONE ACETATE 80 MG/ML IJ SUSP
80.0000 mg | Freq: Once | INTRAMUSCULAR | Status: AC
  Administered 2017-08-16: 80 mg via INTRAMUSCULAR

## 2017-08-16 MED ORDER — FLUCONAZOLE 150 MG PO TABS: 150.0000 mg | ORAL_TABLET | Freq: Once | ORAL | 1 refills | Status: AC

## 2017-08-16 NOTE — Progress Notes (Signed)
   Subjective:    Patient ID: [name], female    DOB: Mar 24, 1956, 61 y.o.   MRN: [id number]  HPI Patient in with right otalgia. Had similar issues in July 2018. Neck is not bothering her. No fever chills or flu-like symptoms.    Review of Systems hx CPPD and will be going to Rheumatologist in April. Endocrinologist thinks she should try [facility]. Patient to consider Prolia.     Objective:   Physical Exam   Right TM slightly full and dull. Not red. Left TM normal.  Neck supple with no adenopathy.  Chest clear to auscultation.     Assessment & Plan:  Right serous otitis media  Plan: Depo-Medrol 80 mg IM.  Zithromax Z-Pak take as directed.

## 2017-08-23 NOTE — Patient Instructions (Addendum)
Depo-Medrol 80 mg IM.  Zithromax Z-Pak take as directed.  Patient will see rheumatologist in April to get an opinion on osteoporosis treatment.

## 2017-10-09 ENCOUNTER — Telehealth: Payer: Self-pay | Admitting: Internal Medicine

## 2017-10-09 NOTE — Telephone Encounter (Signed)
Pt called stating she would like to go forward with getting the prolia inj. Please advise?  Call pt @ [phone]. Thank you!

## 2017-10-10 ENCOUNTER — Encounter: Payer: Self-pay | Admitting: Internal Medicine

## 2017-10-11 ENCOUNTER — Telehealth: Payer: Self-pay

## 2017-10-11 ENCOUNTER — Other Ambulatory Visit: Payer: Self-pay

## 2017-10-11 NOTE — Telephone Encounter (Signed)
Spoke to the patient today and she is going to call her insurance company to see what pharmacy she needs to use for Prolia since her deductible is so high- I have [facility] rx form filled out but do no know if this is the correct pharmacy

## 2017-10-11 NOTE — Telephone Encounter (Signed)
Did verbal order with [facility] for Prolia injection, and also faxed enrollment form to them

## 2017-10-30 ENCOUNTER — Telehealth: Payer: Self-pay | Admitting: Internal Medicine

## 2017-10-30 NOTE — Telephone Encounter (Signed)
Spoke with the patient and she understands that we are waiting for PA approval for Prolia

## 2017-10-30 NOTE — Telephone Encounter (Signed)
Patient is following up on prolia.  Please advise

## 2017-11-01 ENCOUNTER — Telehealth: Payer: Self-pay | Admitting: Internal Medicine

## 2017-11-01 NOTE — Telephone Encounter (Signed)
Patient is calling in regards to her prolia.   please advise

## 2017-11-01 NOTE — Telephone Encounter (Signed)
There are notes on this already we are waiting on PA approval still

## 2017-11-01 NOTE — Telephone Encounter (Signed)
Please advise on below  

## 2017-11-12 ENCOUNTER — Telehealth: Payer: Self-pay | Admitting: Internal Medicine

## 2017-11-12 NOTE — Telephone Encounter (Signed)
I have done all that I can do for this patient so I have a meeting on 12/13/17 with manager over Prolia to discuss what options we have moving forward

## 2017-11-12 NOTE — Telephone Encounter (Signed)
Patient would like a call back to discuss prolia. She stated she called [facility] and was told that they never received the bone density report from 2018 or clinical notes

## 2017-11-12 NOTE — Telephone Encounter (Signed)
Please advise on below it is in regards to Prolia

## 2017-11-12 NOTE — Telephone Encounter (Signed)
Have spoken with the patient and made her aware that she was denied Prolia by her insurance but I will be meeting with Manager of [place] on 11/13/17 to discuss how to proceed

## 2017-11-13 NOTE — Telephone Encounter (Signed)
Faxed Dexa scan results and last office note

## 2017-11-14 NOTE — Telephone Encounter (Signed)
LMTCB to discuss the note below I have done all that I can do at this point I am waiting for [facility] to approve since I have already sent in the documentation they requested on 11/13/17

## 2017-11-16 ENCOUNTER — Telehealth: Payer: Self-pay | Admitting: Internal Medicine

## 2017-11-16 NOTE — Telephone Encounter (Signed)
error 

## 2017-11-16 NOTE — Telephone Encounter (Signed)
Patient would like to discuss Prolia and her insurance.    Please advise

## 2017-11-26 NOTE — Telephone Encounter (Signed)
Spoke to the patient and she wants to know if there is another way besides prolia since her insurance is denying it please advise

## 2017-11-26 NOTE — Telephone Encounter (Signed)
Patient called today and she stated that second denial states she has to try and fail fosamax-patient wanted to ask if there was a reason this was not tried first or if we need to do an appeal please advise

## 2017-11-27 ENCOUNTER — Other Ambulatory Visit: Payer: Self-pay

## 2017-11-27 MED ORDER — ALENDRONATE SODIUM 70 MG PO TABS: 70.0000 mg | ORAL_TABLET | ORAL | 3 refills | Status: DC

## 2017-11-27 NOTE — Telephone Encounter (Signed)
We can use Fosamax 70 mg weekly, no problem. Alternatively, we can use Reclast iv yearly, if not too expensive. [name], can you please ask her her preference?

## 2017-11-27 NOTE — Telephone Encounter (Signed)
Patient has decided to try the fosamax so I am sending in prescription with the recommended dosage

## 2017-11-29 NOTE — Telephone Encounter (Signed)
Thank you :)

## 2017-12-31 ENCOUNTER — Other Ambulatory Visit: Payer: Self-pay | Admitting: Internal Medicine

## 2017-12-31 ENCOUNTER — Telehealth: Payer: Self-pay | Admitting: Internal Medicine

## 2017-12-31 DIAGNOSIS — Z1231 Encounter for screening mammogram for malignant neoplasm of breast: Secondary | ICD-10-CM

## 2017-12-31 NOTE — Telephone Encounter (Signed)
Patient would like to check the approval process for prolia.  Please advise

## 2017-12-31 NOTE — Telephone Encounter (Signed)
I see, yes, that would be great to be able to use Prolia

## 2017-12-31 NOTE — Telephone Encounter (Signed)
Please advise on below  

## 2017-12-31 NOTE — Telephone Encounter (Signed)
In regards to pts Prolia, please advise

## 2017-12-31 NOTE — Telephone Encounter (Signed)
Please see the last communication with her.  We decided for Fosamax 70 mg weekly.Marland KitchenMarland Kitchen

## 2017-12-31 NOTE — Telephone Encounter (Signed)
Pt stated she has taken Fosamax for 4 weeks and it is hurting her stomach, she stated that now she would like to retry getting Prolia. She stated that [name] told her she could get an expedited service. Please advise sending to [place] as well.

## 2017-12-31 NOTE — Telephone Encounter (Signed)
Patient has been resubmitted to the portal

## 2018-01-17 ENCOUNTER — Other Ambulatory Visit: Payer: Self-pay | Admitting: Internal Medicine

## 2018-01-21 ENCOUNTER — Telehealth: Payer: Self-pay | Admitting: Emergency Medicine

## 2018-01-21 NOTE — Telephone Encounter (Signed)
Pt called and asked that you return her call. She has questions about her prolia. Thanks.

## 2018-01-25 NOTE — Telephone Encounter (Signed)
LVM I am ordering Prolia through [facility] out patient pharmacy because I could not get approval through her insurance- if patient goes to pick up Prolia and it is still too expensive through her pharmacy benefit than I have a co-pay card at the front desk with her name on it that she can use instead of insurance

## 2018-01-25 NOTE — Telephone Encounter (Signed)
Spoke with pharmacy and patient can not get Prolia through [facility] outpatient pharmacy because she has to use [facility] instead.

## 2018-01-31 NOTE — Telephone Encounter (Signed)
Prescription has been faxed to [facility] [facility] and patient notified

## 2018-02-11 ENCOUNTER — Telehealth: Payer: Self-pay | Admitting: Internal Medicine

## 2018-02-11 NOTE — Telephone Encounter (Signed)
error 

## 2018-02-13 ENCOUNTER — Telehealth: Payer: Self-pay

## 2018-02-13 ENCOUNTER — Encounter: Payer: Self-pay | Admitting: Internal Medicine

## 2018-02-13 NOTE — Telephone Encounter (Signed)
She has not tried Fosamax - I can start her on this weekly, if she agrees

## 2018-02-13 NOTE — Telephone Encounter (Signed)
Spoke to the patient today to let her know that she has been denied for Prolia again so we now have to enter the appeal process- [facility] is faxing over an appeal so that we can get it done and sent back as fast as possible- [facility] is requesting documentation that the patient has tried and failed Fosamax -FYI

## 2018-02-14 ENCOUNTER — Ambulatory Visit
Admission: RE | Admit: 2018-02-14 | Discharge: 2018-02-14 | Disposition: A | Discharge status: Home / Self Care | Payer: PRIVATE HEALTH INSURANCE | Source: Ambulatory Visit | Attending: Internal Medicine | Admitting: Internal Medicine

## 2018-02-14 DIAGNOSIS — Z1231 Encounter for screening mammogram for malignant neoplasm of breast: Secondary | ICD-10-CM

## 2018-02-14 NOTE — Telephone Encounter (Signed)
I have started appeal process for this patient for Prolia

## 2018-02-20 ENCOUNTER — Telehealth: Payer: Self-pay

## 2018-02-20 NOTE — Telephone Encounter (Signed)
LMTCB

## 2018-02-20 NOTE — Telephone Encounter (Signed)
Appeal was approved and I contacted patient to make her aware that [facility] will contact her to get some information- once Prolia has arrived at the office we will contact patient and schedule her for a nurse visit

## 2018-02-20 NOTE — Telephone Encounter (Signed)
Received fax from [facility] regarding approval for Prolia.  Form placed on Lisa's desk.

## 2018-02-21 ENCOUNTER — Ambulatory Visit: Payer: PRIVATE HEALTH INSURANCE

## 2018-02-21 NOTE — Telephone Encounter (Signed)
Made patient aware we will schedule Prolia injection as soon as her pharmacy sends it in

## 2018-02-26 ENCOUNTER — Other Ambulatory Visit: Payer: Self-pay | Admitting: Orthopedic Surgery

## 2018-02-26 DIAGNOSIS — M25532 Pain in left wrist: Secondary | ICD-10-CM

## 2018-02-27 ENCOUNTER — Telehealth: Payer: Self-pay | Admitting: Internal Medicine

## 2018-02-27 NOTE — Telephone Encounter (Signed)
See other phone note from today.

## 2018-02-27 NOTE — Telephone Encounter (Signed)
[facility] ph# [phone] called to verify shipment of Prolia

## 2018-02-27 NOTE — Telephone Encounter (Signed)
Darnelle Maffucci from the Pharmacy called [facility] stating "He is from a pharmacy. He needs to leave a message. He needs to confirm office info before medication delivery." Pharmacy ph# [phone]

## 2018-02-27 NOTE — Telephone Encounter (Signed)
Spoke with rep from [facility] will be delivered on 03/01/18 for this patient

## 2018-03-01 NOTE — Telephone Encounter (Signed)
Received today/thx dmf

## 2018-03-03 ENCOUNTER — Ambulatory Visit
Admission: RE | Admit: 2018-03-03 | Discharge: 2018-03-03 | Disposition: A | Discharge status: Home / Self Care | Payer: PRIVATE HEALTH INSURANCE | Source: Ambulatory Visit | Attending: Orthopedic Surgery | Admitting: Orthopedic Surgery

## 2018-03-03 DIAGNOSIS — M25532 Pain in left wrist: Secondary | ICD-10-CM

## 2018-03-06 ENCOUNTER — Ambulatory Visit (INDEPENDENT_AMBULATORY_CARE_PROVIDER_SITE_OTHER): Payer: PRIVATE HEALTH INSURANCE

## 2018-03-06 ENCOUNTER — Telehealth: Payer: Self-pay

## 2018-03-06 DIAGNOSIS — M810 Age-related osteoporosis without current pathological fracture: Secondary | ICD-10-CM | POA: Diagnosis not present

## 2018-03-06 MED ORDER — DENOSUMAB 60 MG/ML ~~LOC~~ SOSY
60.0000 mg | PREFILLED_SYRINGE | Freq: Once | SUBCUTANEOUS | Status: AC
  Administered 2018-03-06: 60 mg via SUBCUTANEOUS

## 2018-03-06 NOTE — Telephone Encounter (Signed)
The 2 are not known to interact, however, both can give some flu-like symptoms, so it is probably better that she did not take them together.  However, if she tolerates Prolia well, without flulike symptoms, next time, she can take them together.

## 2018-03-06 NOTE — Telephone Encounter (Signed)
I advised pt at visit since it was her first time getting Prolia to wait before getting the Flu shot because she needs to see how her body will react. Read pt Dr. Ena Dawley message below and the patient stated that she is going to see how she is feeling tomorrow and may cancel the appointment based on that.

## 2018-03-06 NOTE — Progress Notes (Signed)
Per orders of Dr. Cruzita Lederer injection of Prolia given today by [facility] CMA . Patient tolerated injection well.

## 2018-03-06 NOTE — Telephone Encounter (Signed)
Patient came in today to have Prolia injection she requested to have her flu shot as well but was told she could not do that in the same day- patient wanted to make sure that was right so she will know for next time because she will have to make a separate trip to get flu shot please advise

## 2018-03-07 ENCOUNTER — Ambulatory Visit: Payer: PRIVATE HEALTH INSURANCE

## 2018-03-07 DIAGNOSIS — M67439 Ganglion, unspecified wrist: Secondary | ICD-10-CM | POA: Insufficient documentation

## 2018-03-11 ENCOUNTER — Ambulatory Visit (INDEPENDENT_AMBULATORY_CARE_PROVIDER_SITE_OTHER): Payer: PRIVATE HEALTH INSURANCE | Admitting: Internal Medicine

## 2018-03-11 DIAGNOSIS — Z23 Encounter for immunization: Secondary | ICD-10-CM

## 2018-03-11 NOTE — Patient Instructions (Addendum)
Patient received a flu vaccine IM R deltoid, AV, CMA  

## 2018-03-11 NOTE — Progress Notes (Signed)
Flu vaccine given by CMA 

## 2018-04-19 ENCOUNTER — Telehealth: Payer: Self-pay

## 2018-04-19 ENCOUNTER — Encounter: Payer: Self-pay | Admitting: Internal Medicine

## 2018-04-19 ENCOUNTER — Ambulatory Visit (INDEPENDENT_AMBULATORY_CARE_PROVIDER_SITE_OTHER): Payer: PRIVATE HEALTH INSURANCE | Admitting: Internal Medicine

## 2018-04-19 VITALS — BP 130/80 | HR 78 | Temp 98.1°F | Wt 149.0 lb

## 2018-04-19 DIAGNOSIS — R350 Frequency of micturition: Secondary | ICD-10-CM | POA: Diagnosis not present

## 2018-04-19 LAB — POCT URINALYSIS DIPSTICK
APPEARANCE: NEGATIVE
Bilirubin, UA: NEGATIVE
Blood, UA: NEGATIVE
GLUCOSE UA: NEGATIVE
Ketones, UA: NEGATIVE
LEUKOCYTES UA: NEGATIVE
Nitrite, UA: NEGATIVE
ODOR: NEGATIVE
PROTEIN UA: NEGATIVE
SPEC GRAV UA: 1.01 (ref 1.010–1.025)
Urobilinogen, UA: 0.2 E.U./dL
pH, UA: 6.5 (ref 5.0–8.0)

## 2018-04-19 NOTE — Telephone Encounter (Signed)
Patient called states she was seen at urgent care on Saturday for a UTI she was put on macrobid 100mg  BID for 5 days but she feels like the infection has not cleared and this is not the way you have treated her in the past she said she remembers you giving her a shot for it. She would like to know if you can see her today? 4:15?

## 2018-04-19 NOTE — Telephone Encounter (Signed)
Mrs Cacho may come at 4:15 pm

## 2018-04-27 NOTE — Progress Notes (Signed)
   Subjective:    Patient ID: [name], female    DOB: 16-Jun-1955, 62 y.o.   MRN: [id number]  HPI Patient here today for follow-up on a UTI treated on Saturday, November 9 with Macrobid 100 mg twice daily for 5 days.  I do not have records from the urgent care where she was treated.  She called earlier today stating she felt the infection may not have cleared.    Review of Systems no back pain nausea vomiting fever or chills.  Some urinary frequency.     Objective:   Physical Exam Urine dipstick is completely normal       Assessment & Plan:  Patient is pleased to hear urine dipstick is normal.  No additional treatment prescribed today.

## 2018-04-27 NOTE — Patient Instructions (Signed)
Urine dipstick status post treatment for UTI is normal

## 2018-07-24 ENCOUNTER — Telehealth: Payer: Self-pay | Admitting: Internal Medicine

## 2018-07-24 NOTE — Telephone Encounter (Signed)
Due to insurance this patient uses [facility] for prolia

## 2018-07-24 NOTE — Telephone Encounter (Signed)
[name] has called and advised that they are the pharmacy of record for this patients Prolia.  They need her 60mg  Prolia sent to them.  The number below is a fax and their contact number is in contact field.  [phone]

## 2018-07-26 ENCOUNTER — Telehealth: Payer: Self-pay | Admitting: Internal Medicine

## 2018-07-26 NOTE — Telephone Encounter (Signed)
Error

## 2018-07-26 NOTE — Telephone Encounter (Signed)
LMTCB to discuss with the patient- we would switch the pharmacy if insurance agrees to this

## 2018-08-02 ENCOUNTER — Other Ambulatory Visit: Payer: Self-pay

## 2018-08-02 NOTE — Telephone Encounter (Signed)
Patient will not be able to use pharmacy benefit with her new insurance plan so we will have to buy and bill for Prolia this year

## 2018-08-08 ENCOUNTER — Encounter: Payer: Self-pay | Admitting: Internal Medicine

## 2018-08-08 ENCOUNTER — Ambulatory Visit (INDEPENDENT_AMBULATORY_CARE_PROVIDER_SITE_OTHER): Payer: BLUE CROSS/BLUE SHIELD | Admitting: Internal Medicine

## 2018-08-08 VITALS — BP 112/60 | HR 70 | Ht 64.0 in | Wt 144.0 lb

## 2018-08-08 DIAGNOSIS — Z8639 Personal history of other endocrine, nutritional and metabolic disease: Secondary | ICD-10-CM

## 2018-08-08 DIAGNOSIS — M810 Age-related osteoporosis without current pathological fracture: Secondary | ICD-10-CM

## 2018-08-08 LAB — VITAMIN D 25 HYDROXY (VIT D DEFICIENCY, FRACTURES): VITD: 56.6 ng/mL (ref 30.00–100.00)

## 2018-08-08 NOTE — Patient Instructions (Signed)
Please stop at the lab.  Continue vitamin D 1000 units.  Please come back for a follow-up appointment in 1 year.

## 2018-08-08 NOTE — Progress Notes (Signed)
Patient ID: [name], female   DOB: 01/05/1956, 63 y.o.   MRN: [id number]   HPI  [name] is a 63 y.o.-year-old female, returning for follow-up for osteoporosis.  Pt was dx with OP in 2018.  Reviewed DXA scan results: Date L1-L4 T score FN T score 33% distal Radius  03/26/2017 ([facility]) -2.8 RFN: -1.1 LFN: -0.8 n/a  06/25/2012 ([facility]) -1.8 LFN: -0.8 n/a   She denies fractures or falls.  No dizziness/vertigo/orthostasis/poor vision  Osteoporosis treatment: - Fosamax x4 weeks >> abdominal pain - Prolia x1 inj - 03/06/2018  She has a history of vitamin D deficiency.  Reviewed latest vitamin D levels: Lab Results  Component Value Date   VD25OH 54.19 08/07/2017   VD25OH 40 03/06/2017   VD25OH 38 12/16/2015   VD25OH 36 03/25/2012   VD25OH 25 (L) 01/20/2011   On calcium in MVI - started Fall 2018; she takes a vitamin D supplement 2000 to 3000 >> now 1000 units for last ~1 year.  She does weightbearing exercises: 30 minutes 3 times a week with a Physiological scientist.  She also does stretching and weightbearing exercises.  She is not taking high vitamin A doses.  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  She has a history of many steroid injections over the years.  Menopause was in late 4s.  Pt does have a FH of osteoporosis in mother (many fractures).  No history of hyper/hypocalcemia or hyperparathyroidism.  She has a distant history of kidney stone x1 -more than 10 years ago. Lab Results  Component Value Date   CALCIUM 10.2 08/07/2017   CALCIUM 9.3 03/06/2017   CALCIUM 9.1 12/16/2015   CALCIUM 9.4 07/20/2013   CALCIUM 9.8 02/04/2013   CALCIUM 9.2 03/25/2012   CALCIUM 9.1 03/28/2011   CALCIUM 8.7 03/26/2011   CALCIUM 8.5 03/25/2011   CALCIUM 8.2 (L) 03/24/2011   No history of thyrotoxicosis.  Latest TFTs normal: Lab Results  Component Value Date   TSH 0.97 03/06/2017   TSH 1.51 12/16/2015   TSH 2.172 03/25/2012   TSH 1.413  01/20/2011   No CKD. Last BUN/Cr: Lab Results  Component Value Date   BUN 15 08/07/2017   CREATININE 0.92 08/07/2017   ROS: Constitutional: no weight gain/+ intentional weight loss, no fatigue, no heat/cold intolerance. + nocturia. Eyes: no blurry vision, no xerophthalmia ENT: no sore throat, no nodules palpated in neck, no dysphagia, no odynophagia, no hoarseness Cardiovascular: no CP/no SOB/no palpitations/no leg swelling Respiratory: no cough/no SOB/no wheezing Gastrointestinal: no N/no V/no D/no C/no acid reflux Musculoskeletal: no muscle aches/no joint aches Skin: no rashes, no hair loss Neurological: no tremors/no numbness/no tingling/no dizziness  I reviewed pt's medications, allergies, PMH, social hx, family hx, and changes were documented in the history of present illness. Otherwise, unchanged from my initial visit note.  Past Medical History:  Diagnosis Date  . Allergy   . Arthritis   . Complication of anesthesia   . H/O calcium pyrophosphate deposition disease (CPPD)   . Migraine   . PONV (postoperative nausea and vomiting)    Past Surgical History:  Procedure Laterality Date  . BUNIONECTOMY Left   . CERVICAL DISCECTOMY     C3-4  . EYE SURGERY Bilateral    LASIK  . JOINT REPLACEMENT Bilateral    knees  . KNEE ARTHROSCOPY Left   . KNEE ARTHROSCOPY Right 05/14/2013   Procedure: RIGHT ARTHROSCOPY KNEE;  Surgeon: Gearlean Alf,  MD;  Location: [facility];  Service: Orthopedics;  Laterality: Right;   Social History   Socioeconomic History  . Marital status: Married    Spouse name: Not on file  . Number of children: Not on file  . Years of education: Not on file  . Highest education level: Not on file  Occupational History  . Not on file  Social Needs  . Financial resource strain: Not on file  . Food insecurity:    Worry: Not on file    Inability: Not on file  . Transportation needs:    Medical: Not on file    Non-medical: Not on file  Tobacco Use  .  Smoking status: Never Smoker  . Smokeless tobacco: Never Used  Substance and Sexual Activity  . Alcohol use: Yes    Comment: socially  . Drug use: No  . Sexual activity: Not on file  Lifestyle  . Physical activity:    Days per week: Not on file    Minutes per session: Not on file  . Stress: Not on file  Relationships  . Social connections:    Talks on phone: Not on file    Gets together: Not on file    Attends religious service: Not on file    Active member of club or organization: Not on file    Attends meetings of clubs or organizations: Not on file    Relationship status: Not on file  . Intimate partner violence:    Fear of current or ex partner: Not on file    Emotionally abused: Not on file    Physically abused: Not on file    Forced sexual activity: Not on file  Other Topics Concern  . Not on file  Social History Narrative  . Not on file   Current Outpatient Medications on File Prior to Visit  Medication Sig Dispense Refill  . celecoxib (CELEBREX) 200 MG capsule TAKE 1 CAPSULE BY MOUTH TWO TIMES DAILY 180 capsule 1  . Cholecalciferol (VITAMIN D) 2000 UNITS CAPS Take 2,000 Units by mouth daily.    Marland Kitchen denosumab (PROLIA) 60 MG/ML SOSY injection Inject 60 mg into the skin every 6 (six) months.    . Melatonin 5 MG TABS Take 5 mg by mouth daily.     No current facility-administered medications on file prior to visit.    No Known Allergies Family History  Problem Relation Age of Onset  . Heart disease Mother   . Breast cancer Neg Hx     PE: BP 112/60   Pulse 70   Ht 5\' 4"  (1.626 m) Comment: measured without shoes  Wt 144 lb (65.3 kg)   SpO2 98%   BMI 24.72 kg/m  Wt Readings from Last 3 Encounters:  08/08/18 144 lb (65.3 kg)  04/19/18 149 lb (67.6 kg)  08/16/17 148 lb (67.1 kg)   Constitutional: normal weight, in NAD Eyes: PERRLA, EOMI, no exophthalmos ENT: moist mucous membranes, no thyromegaly, no cervical lymphadenopathy Cardiovascular: RRR, No  MRG Respiratory: CTA B Gastrointestinal: abdomen soft, NT, ND, BS+ Musculoskeletal: no deformities, strength intact in all 4 Skin: moist, warm, no rashes Neurological: no tremor with outstretched hands, DTR normal in all 4  Assessment: 1. Osteoporosis  2.  History of vitamin D deficiency  Plan: 1. Osteoporosis  -Likely age-related/postmenopausal, but possibly also component from steroid injections and also patient has family history of osteoporosis -At last visit, we reviewed together her recent bone density scans and I explained that her scores  were worse and she was at high risk for fracture based on the T-scores from 2018.  We discussed about different medication classes, benefits, and possible side effects.  I recommended to start Prolia.  She agreed with this but wanted to discuss with rheumatology first (she is followed for pseudogout).  Per insurance preference, she tried Fosamax for 4 weeks, however, this gave her abdominal pain so we ended up starting Prolia 03/2018.  She tolerated this well. -At last visit, we also discussed about maintaining a good amount of calcium and protein in her diet and she is doing a good job with this.  She is also exercising with a personal trainer and does weightbearing exercises.  I also gave her information about [facility] - for skeletal loading.  She did not try this. -Since last visit, she has no falls, no fractures, no jaw or thigh pain. -Will check the following labs today: Vitamin D, BMP. -At last visit, we discussed about checking another DXA scan 2 years after we started Prolia. -I will see her back in a year  2.  History of vitamin D deficiency -Most recent vitamin D levels were normal -At that time, she was taking 2000 to 3000 units vitamin D daily, but she did decrease the dose since last visit only 1000 units daily. -We discussed that in the future, she may need a higher dose of vitamin D during the winter months -recheck level  today to see if we need to increase the dose  Office Visit on 08/08/2018  Component Date Value Ref Range Status  . VITD 08/08/2018 56.60  30.00 - 100.00 ng/mL Final  . Glucose, Bld 08/08/2018 92  65 - 99 mg/dL Final   Comment: .            Fasting reference interval .   . BUN 08/08/2018 16  7 - 25 mg/dL Final  . Creat 08/08/2018 0.85  0.50 - 0.99 mg/dL Final   Comment: For patients >43 years of age, the reference limit for Creatinine is approximately 13% higher for people identified as African-American. .   . GFR, Est Non African American 08/08/2018 74  > OR = 60 mL/min/1.52m2 Final  . GFR, Est African American 08/08/2018 86  > OR = 60 mL/min/1.57m2 Final  . BUN/Creatinine Ratio 67/61/9509 NOT APPLICABLE  6 - 22 (calc) Final  . Sodium 08/08/2018 140  135 - 146 mmol/L Final  . Potassium 08/08/2018 4.4  3.5 - 5.3 mmol/L Final  . Chloride 08/08/2018 106  98 - 110 mmol/L Final  . CO2 08/08/2018 26  20 - 32 mmol/L Final  . Calcium 08/08/2018 9.6  8.6 - 10.4 mg/dL Final   Normal labs.  [name], MD PhD [facility] Endocrinology

## 2018-08-09 LAB — BASIC METABOLIC PANEL WITH GFR
BUN: 16 mg/dL (ref 7–25)
CALCIUM: 9.6 mg/dL (ref 8.6–10.4)
CHLORIDE: 106 mmol/L (ref 98–110)
CO2: 26 mmol/L (ref 20–32)
Creat: 0.85 mg/dL (ref 0.50–0.99)
GFR, EST AFRICAN AMERICAN: 86 mL/min/{1.73_m2} (ref >=60)
GFR, Est Non African American: 74 mL/min/{1.73_m2} (ref >=60)
Glucose, Bld: 92 mg/dL (ref 65–99)
POTASSIUM: 4.4 mmol/L (ref 3.5–5.3)
Sodium: 140 mmol/L (ref 135–146)

## 2018-08-19 ENCOUNTER — Telehealth: Payer: Self-pay | Admitting: Internal Medicine

## 2018-08-19 NOTE — Telephone Encounter (Signed)
[name] [phone]  Hoyle Sauer called to say that her daughter is having a baby in a couple of weeks and she needs to see if she has had TDAP and if so how long does it last.

## 2018-08-19 NOTE — Telephone Encounter (Signed)
2014 patient was notified.

## 2018-08-23 ENCOUNTER — Telehealth: Payer: Self-pay

## 2018-08-23 ENCOUNTER — Telehealth: Payer: Self-pay | Admitting: Internal Medicine

## 2018-08-23 NOTE — Telephone Encounter (Signed)
I have done the PA through [employer] for Prolia for this patient and the Ref # for this is [id number] there is a 72 hour turn around time for this

## 2018-08-26 NOTE — Telephone Encounter (Signed)
error 

## 2018-09-05 ENCOUNTER — Telehealth: Payer: Self-pay | Admitting: Internal Medicine

## 2018-09-05 NOTE — Telephone Encounter (Signed)
Information from insurance regarding Prolia - Per patient her insurance is faxing form to you within the next hour regarding Prolia approval. Patient also stated that if the return fax is labeled urgent that it would be processed faster on the insurance side if things  Please call her with any questions [phone]

## 2018-09-06 ENCOUNTER — Telehealth: Payer: Self-pay | Admitting: Internal Medicine

## 2018-09-06 NOTE — Telephone Encounter (Signed)
Patient would like a call back to discuss her prolia

## 2018-09-09 NOTE — Telephone Encounter (Signed)
I have done verbal PA and the reference number for this is [id number]- we are waiting for approval now

## 2018-09-09 NOTE — Telephone Encounter (Signed)
Resolved

## 2018-09-17 ENCOUNTER — Ambulatory Visit (INDEPENDENT_AMBULATORY_CARE_PROVIDER_SITE_OTHER): Payer: BLUE CROSS/BLUE SHIELD

## 2018-09-17 ENCOUNTER — Other Ambulatory Visit: Payer: Self-pay

## 2018-09-17 DIAGNOSIS — M810 Age-related osteoporosis without current pathological fracture: Secondary | ICD-10-CM

## 2018-09-17 MED ORDER — DENOSUMAB 60 MG/ML ~~LOC~~ SOSY
60.0000 mg | PREFILLED_SYRINGE | Freq: Once | SUBCUTANEOUS | Status: AC
  Administered 2018-09-17: 13:00:00 60 mg via SUBCUTANEOUS

## 2018-09-17 NOTE — Progress Notes (Signed)
Per orders of Dr. [name] injection of Prolia 60mg /mL given today by M.Conger,CMA . Patient tolerated injection well.

## 2018-10-08 NOTE — Telephone Encounter (Signed)
Patient received Prolia on 09/17/18

## 2018-11-07 DIAGNOSIS — H52203 Unspecified astigmatism, bilateral: Secondary | ICD-10-CM | POA: Diagnosis not present

## 2018-11-07 DIAGNOSIS — H04123 Dry eye syndrome of bilateral lacrimal glands: Secondary | ICD-10-CM | POA: Diagnosis not present

## 2018-11-07 DIAGNOSIS — Z79899 Other long term (current) drug therapy: Secondary | ICD-10-CM | POA: Diagnosis not present

## 2018-11-07 DIAGNOSIS — H43813 Vitreous degeneration, bilateral: Secondary | ICD-10-CM | POA: Diagnosis not present

## 2018-11-20 DIAGNOSIS — Z79899 Other long term (current) drug therapy: Secondary | ICD-10-CM | POA: Diagnosis not present

## 2018-12-09 DIAGNOSIS — D1801 Hemangioma of skin and subcutaneous tissue: Secondary | ICD-10-CM | POA: Diagnosis not present

## 2018-12-09 DIAGNOSIS — D225 Melanocytic nevi of trunk: Secondary | ICD-10-CM | POA: Diagnosis not present

## 2018-12-09 DIAGNOSIS — L812 Freckles: Secondary | ICD-10-CM | POA: Diagnosis not present

## 2018-12-09 DIAGNOSIS — L821 Other seborrheic keratosis: Secondary | ICD-10-CM | POA: Diagnosis not present

## 2019-01-31 ENCOUNTER — Encounter: Payer: Self-pay | Admitting: Internal Medicine

## 2019-01-31 ENCOUNTER — Ambulatory Visit (INDEPENDENT_AMBULATORY_CARE_PROVIDER_SITE_OTHER): Payer: BC Managed Care – PPO | Admitting: Internal Medicine

## 2019-01-31 ENCOUNTER — Other Ambulatory Visit: Payer: Self-pay

## 2019-01-31 DIAGNOSIS — Z23 Encounter for immunization: Secondary | ICD-10-CM

## 2019-01-31 NOTE — Patient Instructions (Signed)
Patient received a flu shot R, deltoid.

## 2019-01-31 NOTE — Progress Notes (Signed)
Flu vaccine given by CMA 

## 2019-02-05 ENCOUNTER — Other Ambulatory Visit: Payer: Self-pay

## 2019-02-05 ENCOUNTER — Other Ambulatory Visit: Payer: Self-pay | Admitting: Obstetrics & Gynecology

## 2019-02-05 DIAGNOSIS — Z20822 Contact with and (suspected) exposure to covid-19: Secondary | ICD-10-CM

## 2019-02-05 DIAGNOSIS — R6889 Other general symptoms and signs: Secondary | ICD-10-CM | POA: Diagnosis not present

## 2019-02-05 DIAGNOSIS — Z1231 Encounter for screening mammogram for malignant neoplasm of breast: Secondary | ICD-10-CM

## 2019-02-06 LAB — NOVEL CORONAVIRUS, NAA: SARS-CoV-2, NAA: NOT DETECTED

## 2019-02-15 ENCOUNTER — Encounter: Payer: Self-pay | Admitting: Internal Medicine

## 2019-02-17 NOTE — Telephone Encounter (Signed)
Dr. Cruzita Lederer - please review and advise  [name] - Please review and advise if anything is needed in terms of her Prolia scheduling/auth.

## 2019-02-17 NOTE — Telephone Encounter (Signed)
Prolia was given on 09/17/2018 per the notes. She is due shortly after 03/19/2019

## 2019-03-18 ENCOUNTER — Other Ambulatory Visit: Payer: Self-pay

## 2019-03-18 ENCOUNTER — Other Ambulatory Visit: Payer: BC Managed Care – PPO | Admitting: Internal Medicine

## 2019-03-18 DIAGNOSIS — M810 Age-related osteoporosis without current pathological fracture: Secondary | ICD-10-CM | POA: Diagnosis not present

## 2019-03-18 DIAGNOSIS — Z1329 Encounter for screening for other suspected endocrine disorder: Secondary | ICD-10-CM

## 2019-03-18 DIAGNOSIS — Z8739 Personal history of other diseases of the musculoskeletal system and connective tissue: Secondary | ICD-10-CM

## 2019-03-18 DIAGNOSIS — M25561 Pain in right knee: Secondary | ICD-10-CM

## 2019-03-18 DIAGNOSIS — Z1321 Encounter for screening for nutritional disorder: Secondary | ICD-10-CM

## 2019-03-18 DIAGNOSIS — M25562 Pain in left knee: Secondary | ICD-10-CM | POA: Diagnosis not present

## 2019-03-18 DIAGNOSIS — Z8669 Personal history of other diseases of the nervous system and sense organs: Secondary | ICD-10-CM

## 2019-03-18 DIAGNOSIS — Z0000 Encounter for general adult medical examination without abnormal findings: Secondary | ICD-10-CM | POA: Diagnosis not present

## 2019-03-18 DIAGNOSIS — G8929 Other chronic pain: Secondary | ICD-10-CM

## 2019-03-19 LAB — COMPLETE METABOLIC PANEL WITH GFR
AG Ratio: 1.8 (calc) (ref 1.0–2.5)
ALT: 16 U/L (ref 6–29)
AST: 24 U/L (ref 10–35)
Albumin: 4.4 g/dL (ref 3.6–5.1)
Alkaline phosphatase (APISO): 42 U/L (ref 37–153)
BUN: 15 mg/dL (ref 7–25)
CO2: 28 mmol/L (ref 20–32)
Calcium: 9.4 mg/dL (ref 8.6–10.4)
Chloride: 106 mmol/L (ref 98–110)
Creat: 0.74 mg/dL (ref 0.50–0.99)
GFR, Est African American: 101 mL/min/{1.73_m2} (ref >=60)
GFR, Est Non African American: 87 mL/min/{1.73_m2} (ref >=60)
Globulin: 2.5 g/dL (calc) (ref 1.9–3.7)
Glucose, Bld: 84 mg/dL (ref 65–99)
Potassium: 4.3 mmol/L (ref 3.5–5.3)
Sodium: 139 mmol/L (ref 135–146)
Total Bilirubin: 0.5 mg/dL (ref 0.2–1.2)
Total Protein: 6.9 g/dL (ref 6.1–8.1)

## 2019-03-19 LAB — LIPID PANEL
Cholesterol: 232 mg/dL — ABNORMAL HIGH (ref <200)
HDL: 100 mg/dL (ref >=50)
LDL Cholesterol (Calc): 116 mg/dL (calc) — ABNORMAL HIGH
Non-HDL Cholesterol (Calc): 132 mg/dL (calc) — ABNORMAL HIGH (ref <130)
Total CHOL/HDL Ratio: 2.3 (calc) (ref <5.0)
Triglycerides: 69 mg/dL (ref <150)

## 2019-03-19 LAB — CBC WITH DIFFERENTIAL/PLATELET
Absolute Monocytes: 305 cells/uL (ref 200–950)
Basophils Absolute: 50 cells/uL (ref 0–200)
Basophils Relative: 1.8 %
Eosinophils Absolute: 137 cells/uL (ref 15–500)
Eosinophils Relative: 4.9 %
HCT: 41.1 % (ref 35.0–45.0)
Hemoglobin: 13.6 g/dL (ref 11.7–15.5)
Lymphs Abs: 1271 cells/uL (ref 850–3900)
MCH: 30.5 pg (ref 27.0–33.0)
MCHC: 33.1 g/dL (ref 32.0–36.0)
MCV: 92.2 fL (ref 80.0–100.0)
MPV: 9.2 fL (ref 7.5–12.5)
Monocytes Relative: 10.9 %
Neutro Abs: 1036 cells/uL — ABNORMAL LOW (ref 1500–7800)
Neutrophils Relative %: 37 %
Platelets: 300 10*3/uL (ref 140–400)
RBC: 4.46 10*6/uL (ref 3.80–5.10)
RDW: 12.7 % (ref 11.0–15.0)
Total Lymphocyte: 45.4 %
WBC: 2.8 10*3/uL — ABNORMAL LOW (ref 3.8–10.8)

## 2019-03-19 LAB — TSH: TSH: 1.65 mIU/L (ref 0.40–4.50)

## 2019-03-19 LAB — VITAMIN D 25 HYDROXY (VIT D DEFICIENCY, FRACTURES): Vit D, 25-Hydroxy: 41 ng/mL (ref 30–100)

## 2019-03-20 ENCOUNTER — Other Ambulatory Visit: Payer: Self-pay | Admitting: Internal Medicine

## 2019-03-20 ENCOUNTER — Ambulatory Visit (INDEPENDENT_AMBULATORY_CARE_PROVIDER_SITE_OTHER): Payer: BC Managed Care – PPO

## 2019-03-20 ENCOUNTER — Encounter: Payer: Self-pay | Admitting: Internal Medicine

## 2019-03-20 ENCOUNTER — Ambulatory Visit (INDEPENDENT_AMBULATORY_CARE_PROVIDER_SITE_OTHER): Payer: BC Managed Care – PPO | Admitting: Internal Medicine

## 2019-03-20 ENCOUNTER — Other Ambulatory Visit: Payer: Self-pay

## 2019-03-20 VITALS — BP 110/80 | HR 66 | Temp 98.3°F | Ht 64.0 in | Wt 154.0 lb

## 2019-03-20 DIAGNOSIS — Z8669 Personal history of other diseases of the nervous system and sense organs: Secondary | ICD-10-CM | POA: Diagnosis not present

## 2019-03-20 DIAGNOSIS — G44209 Tension-type headache, unspecified, not intractable: Secondary | ICD-10-CM | POA: Diagnosis not present

## 2019-03-20 DIAGNOSIS — Z8739 Personal history of other diseases of the musculoskeletal system and connective tissue: Secondary | ICD-10-CM

## 2019-03-20 DIAGNOSIS — N6310 Unspecified lump in the right breast, unspecified quadrant: Secondary | ICD-10-CM

## 2019-03-20 DIAGNOSIS — M810 Age-related osteoporosis without current pathological fracture: Secondary | ICD-10-CM

## 2019-03-20 DIAGNOSIS — Z0000 Encounter for general adult medical examination without abnormal findings: Secondary | ICD-10-CM | POA: Diagnosis not present

## 2019-03-20 DIAGNOSIS — M549 Dorsalgia, unspecified: Secondary | ICD-10-CM

## 2019-03-20 DIAGNOSIS — D708 Other neutropenia: Secondary | ICD-10-CM

## 2019-03-20 LAB — POCT URINALYSIS DIPSTICK
Appearance: NEGATIVE
Bilirubin, UA: NEGATIVE
Blood, UA: NEGATIVE
Glucose, UA: NEGATIVE
Ketones, UA: NEGATIVE
Leukocytes, UA: NEGATIVE
Nitrite, UA: NEGATIVE
Odor: NEGATIVE
Protein, UA: NEGATIVE
Spec Grav, UA: 1.01 (ref 1.010–1.025)
Urobilinogen, UA: 0.2 E.U./dL
pH, UA: 6 (ref 5.0–8.0)

## 2019-03-20 MED ORDER — DENOSUMAB 60 MG/ML ~~LOC~~ SOSY
60.0000 mg | PREFILLED_SYRINGE | Freq: Once | SUBCUTANEOUS | Status: AC
  Administered 2019-03-20: 60 mg via SUBCUTANEOUS

## 2019-03-20 MED ORDER — AMITRIPTYLINE HCL 10 MG PO TABS: 10.0000 mg | ORAL_TABLET | Freq: Every day | ORAL | 0 refills | Status: DC

## 2019-03-20 NOTE — Progress Notes (Signed)
Per orders of Dr. Gherghe injection of Prolia given today by L. Walker CMA . Patient tolerated injection well. 

## 2019-03-20 NOTE — Progress Notes (Signed)
   Subjective:    Patient ID: [name], female    DOB: 08/12/1955, 63 y.o.   MRN: [id number]  HPI 63 year old Female for health maintenance exam and evaluation of medical issues.  History of calcium pyrophosphate deposition disease(CPPD), migraine headaches, allergic rhinitis.  In 2012 she underwent bilateral knee arthroplasties but still has issues with joint pain.  Is allergic to grass, pollens and molds.  No known drug allergies  Some issues with muscle contraction headache starting in her neck.  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  Husband is a Stage manager.  Adult children, a daughter and a son.  Now has a grandchild.  Non-smoker.  Social alcohol consumption.  Family history: Father died of aplastic anemia with history of CVA.  Mother with history of subependymoma of the fourth ventricle.  Patient sister died of lower extremity sarcoma at the age of 30.    Review of Systems bilateral knee pain, history of migraines and neck pain.  Some upper back pain as well.     Objective:   Physical Exam Blood pressure 110/80 pulse 66 temperature 98.3 degrees pulse oximetry 99% weight 154 pounds.  BMI 26.43  Skin warm and dry.  Nodes none.  TMs are clear.  Neck is supple without JVD thyromegaly or carotid bruits.  Cardiac exam regular rate and rhythm normal S1 and S2.  No murmur appreciated.  Chest is clear to auscultation without rales or wheezing.  Abdomen soft nondistended without hepatosplenomegaly masses or tenderness.  GYN exam deferred to GYN physician. No joint effusions.  No adenopathy.  Neuro she is alert and oriented to person place and time.  Judgment and affect are normal.  Coordination is normal.  No cranial nerve deficits.        Assessment & Plan:  CPPD-continues to have chronic bilateral knee pain-continue Celebrex.  Started on Plaquenil in November 2019.  History of migraine headaches treated with amitriptyline  Muscle contraction  headache treated with amitriptyline  Musculoskeletal pain in the neck and upper back treated with Celebrex and amitriptyline.  Consider physical therapy  Allergic rhinitis  High HDL cholesterol of 100  LDL is slightly elevated at 116  Osteoporosis treated with Prolia by GYN physician.  DEXA scan 2018 revealed -2.8 T score.  DEXA needs to be repeated.  History of benign neutropenia which has been evaluated by hematologist.  Apparently her anti-inflammatory medication can cause neutropenia as well but she needs it for her joint pain.  White blood cell count is 2800 and previously was 3200 2 years ago.  Continue to monitor.  We will follow-up on this in December with pathology review of smear and repeat CBC.  Consider physical therapy for muscle contraction headache/neck pain/upper back pain.

## 2019-03-21 ENCOUNTER — Ambulatory Visit
Admission: RE | Admit: 2019-03-21 | Discharge: 2019-03-21 | Disposition: A | Discharge status: Home / Self Care | Payer: BC Managed Care – PPO | Source: Ambulatory Visit | Attending: Internal Medicine | Admitting: Internal Medicine

## 2019-03-21 ENCOUNTER — Ambulatory Visit: Payer: BC Managed Care – PPO

## 2019-03-21 DIAGNOSIS — N6489 Other specified disorders of breast: Secondary | ICD-10-CM | POA: Diagnosis not present

## 2019-03-21 DIAGNOSIS — R928 Other abnormal and inconclusive findings on diagnostic imaging of breast: Secondary | ICD-10-CM | POA: Diagnosis not present

## 2019-03-21 DIAGNOSIS — N6310 Unspecified lump in the right breast, unspecified quadrant: Secondary | ICD-10-CM

## 2019-03-26 ENCOUNTER — Telehealth: Payer: Self-pay | Admitting: Internal Medicine

## 2019-03-26 NOTE — Telephone Encounter (Addendum)
LVM for patient to call back about PT referral, and let us know where she wants to go, and she will need to check with her insurance company to see who is in network.

## 2019-03-26 NOTE — Telephone Encounter (Signed)
Patient called back and said she had an appointment with [facility] on 04/08/19 for Physical Therapy, and that they had not said anything about it not being covered so she is going to follow up with them and her insurance company to make sure it is covered.

## 2019-04-08 DIAGNOSIS — M542 Cervicalgia: Secondary | ICD-10-CM | POA: Diagnosis not present

## 2019-04-22 DIAGNOSIS — M542 Cervicalgia: Secondary | ICD-10-CM | POA: Diagnosis not present

## 2019-04-24 ENCOUNTER — Other Ambulatory Visit: Payer: PRIVATE HEALTH INSURANCE | Admitting: Internal Medicine

## 2019-04-24 DIAGNOSIS — Z01419 Encounter for gynecological examination (general) (routine) without abnormal findings: Secondary | ICD-10-CM | POA: Diagnosis not present

## 2019-04-24 DIAGNOSIS — Z6826 Body mass index (BMI) 26.0-26.9, adult: Secondary | ICD-10-CM | POA: Diagnosis not present

## 2019-04-28 ENCOUNTER — Encounter: Payer: PRIVATE HEALTH INSURANCE | Admitting: Internal Medicine

## 2019-05-04 ENCOUNTER — Encounter: Payer: Self-pay | Admitting: Internal Medicine

## 2019-05-04 NOTE — Patient Instructions (Addendum)
Continue current medications.  Consider physical therapy for upper back pain and neck pain.  Continue Prolia.  Needs DEXA scan.  Last one on file was 2018.  Have repeat CBC with path review of smear in December.

## 2019-05-06 DIAGNOSIS — M542 Cervicalgia: Secondary | ICD-10-CM | POA: Diagnosis not present

## 2019-05-09 DIAGNOSIS — Z13820 Encounter for screening for osteoporosis: Secondary | ICD-10-CM | POA: Diagnosis not present

## 2019-05-16 ENCOUNTER — Other Ambulatory Visit: Payer: Self-pay

## 2019-05-16 ENCOUNTER — Other Ambulatory Visit: Payer: BC Managed Care – PPO | Admitting: Internal Medicine

## 2019-05-16 DIAGNOSIS — D72819 Decreased white blood cell count, unspecified: Secondary | ICD-10-CM

## 2019-05-16 LAB — CBC WITH DIFFERENTIAL/PLATELET
Absolute Monocytes: 396 cells/uL (ref 200–950)
Basophils Absolute: 50 cells/uL (ref 0–200)
Basophils Relative: 1.5 %
Eosinophils Absolute: 122 cells/uL (ref 15–500)
Eosinophils Relative: 3.7 %
HCT: 40.2 % (ref 35.0–45.0)
Hemoglobin: 13.3 g/dL (ref 11.7–15.5)
Lymphs Abs: 1696 cells/uL (ref 850–3900)
MCH: 30.8 pg (ref 27.0–33.0)
MCHC: 33.1 g/dL (ref 32.0–36.0)
MCV: 93.1 fL (ref 80.0–100.0)
MPV: 9.3 fL (ref 7.5–12.5)
Monocytes Relative: 12 %
Neutro Abs: 1036 cells/uL — ABNORMAL LOW (ref 1500–7800)
Neutrophils Relative %: 31.4 %
Platelets: 304 10*3/uL (ref 140–400)
RBC: 4.32 10*6/uL (ref 3.80–5.10)
RDW: 12.4 % (ref 11.0–15.0)
Total Lymphocyte: 51.4 %
WBC: 3.3 10*3/uL — ABNORMAL LOW (ref 3.8–10.8)

## 2019-05-19 ENCOUNTER — Encounter: Payer: Self-pay | Admitting: Internal Medicine

## 2019-06-11 ENCOUNTER — Other Ambulatory Visit: Payer: Self-pay

## 2019-06-11 DIAGNOSIS — Z20822 Contact with and (suspected) exposure to covid-19: Secondary | ICD-10-CM

## 2019-06-12 ENCOUNTER — Telehealth: Payer: Self-pay | Admitting: Internal Medicine

## 2019-06-12 LAB — NOVEL CORONAVIRUS, NAA: SARS-CoV-2, NAA: NOT DETECTED

## 2019-06-12 MED ORDER — AMITRIPTYLINE HCL 10 MG PO TABS: 10.0000 mg | ORAL_TABLET | Freq: Every day | ORAL | 0 refills | Status: DC

## 2019-06-12 NOTE — Telephone Encounter (Signed)
Received Fax RX request from  [facility] [place], [address] AT [address] [place] Phone:  [phone]  Fax:  [phone]       Medication - amitriptyline (ELAVIL) 10 MG tablet   Last Refill - 05/14/19  Last OV - 03/20/19  Last CPE - 03/20/19  Next Appointment -

## 2019-08-06 ENCOUNTER — Other Ambulatory Visit: Payer: Self-pay

## 2019-08-07 ENCOUNTER — Encounter: Payer: Self-pay | Admitting: Internal Medicine

## 2019-08-07 DIAGNOSIS — H1031 Unspecified acute conjunctivitis, right eye: Secondary | ICD-10-CM | POA: Diagnosis not present

## 2019-08-07 NOTE — Progress Notes (Signed)
Patient ID: [name], female   DOB: 03/25/1956, 64 y.o.   MRN: [id number]   This visit occurred during the SARS-CoV-2 public health emergency.  Safety protocols were in place, including screening questions prior to the visit, additional usage of staff PPE, and extensive cleaning of exam room while observing appropriate contact time as indicated for disinfecting solutions.   HPI  [name] is a 64 y.o.-year-old female, returning for follow-up for osteoporosis.  Last visit a year ago.  She developed thoracic spine pain after turning 3 year ago. She saw orthopedics >> was in PT.  Pt was dx with OP in 2018.  Reviewed DEXA scan results: Date L1-L4 T score FN T score 33% distal Radius  05/09/2019 ([facility]) -2.2 RFN: -1.2 LFN: -0.7 n/a  03/26/2017 ([facility]) -2.8 RFN: -1.1 LFN: -0.8 n/a  06/25/2012 ([facility]) -1.8 LFN: -0.8 n/a   No fractures or falls.  She denies dizziness/vertigo/orthostasis/poor vision  Osteoporosis treatment: - Fosamax x4 weeks >> abdominal pain - Prolia injections- 03/06/2018, 09/17/2018, 03/20/2019  She has a history of vitamin D deficiency.  Latest levels have been normal, though: Lab Results  Component Value Date   VD25OH 41 03/18/2019   VD25OH 56.60 08/08/2018   VD25OH 54.19 08/07/2017   VD25OH 40 03/06/2017   VD25OH 38 12/16/2015   VD25OH 36 03/25/2012   VD25OH 25 (L) 01/20/2011   She was previously on vitamin D 2000 to 3000 units daily, now 3000 IU daily (she mentions that she was on the same dose at last visit but did not realize at that time).  She continues weightbearing exercises: 30 minutes 3 times a week with a Physiological scientist.  She also does stretching.  She is not taking high vitamin A doses.  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  She has a history of many steroid injections over the years.  Menopause was in late 12s.  Pt does have a FH of osteoporosis in mother (many fractures).  No history of hyper or  hypocalcemia or hyperparathyroidism.  She has a distant history of kidney stone x1-more than 10 years ago. Lab Results  Component Value Date   CALCIUM 9.4 03/18/2019   CALCIUM 9.6 08/08/2018   CALCIUM 10.2 08/07/2017   CALCIUM 9.3 03/06/2017   CALCIUM 9.1 12/16/2015   CALCIUM 9.4 07/20/2013   CALCIUM 9.8 02/04/2013   CALCIUM 9.2 03/25/2012   CALCIUM 9.1 03/28/2011   CALCIUM 8.7 03/26/2011   No history of thyrotoxicosis: Lab Results  Component Value Date   TSH 1.65 03/18/2019   TSH 0.97 03/06/2017   TSH 1.51 12/16/2015   TSH 2.172 03/25/2012   TSH 1.413 01/20/2011   No CKD. Last BUN/Cr: Lab Results  Component Value Date   BUN 15 03/18/2019   CREATININE 0.74 03/18/2019   She has a h/o cervical spine fusion sx 2005.  ROS: Constitutional: + weight gain/no weight loss, no fatigue, no subjective hyperthermia, no subjective hypothermia Eyes: no blurry vision, no xerophthalmia ENT: no sore throat, no nodules palpated in neck, no dysphagia, no odynophagia, no hoarseness Cardiovascular: no CP/no SOB/no palpitations/no leg swelling Respiratory: no cough/no SOB/no wheezing Gastrointestinal: no N/no V/no D/no C/no acid reflux Musculoskeletal: no muscle aches/+ joint aches - back Skin: no rashes, no hair loss Neurological: no tremors/no numbness/no tingling/no dizziness  I reviewed pt's medications, allergies, PMH, social hx, family hx, and changes were documented in the history of present illness. Otherwise, unchanged from my initial  visit note.  Past Medical History:  Diagnosis Date  . Allergy   . Arthritis   . Complication of anesthesia   . H/O calcium pyrophosphate deposition disease (CPPD)   . Migraine   . PONV (postoperative nausea and vomiting)    Past Surgical History:  Procedure Laterality Date  . BUNIONECTOMY Left   . CERVICAL DISCECTOMY     C3-4  . EYE SURGERY Bilateral    LASIK  . JOINT REPLACEMENT Bilateral    knees  . KNEE ARTHROSCOPY Left   . KNEE  ARTHROSCOPY Right 05/14/2013   Procedure: RIGHT ARTHROSCOPY KNEE;  Surgeon: Gearlean Alf, MD;  Location: [facility];  Service: Orthopedics;  Laterality: Right;   Social History   Socioeconomic History  . Marital status: Married    Spouse name: Not on file  . Number of children: Not on file  . Years of education: Not on file  . Highest education level: Not on file  Occupational History  . Not on file  Tobacco Use  . Smoking status: Never Smoker  . Smokeless tobacco: Never Used  Substance and Sexual Activity  . Alcohol use: Yes    Comment: socially  . Drug use: No  . Sexual activity: Not on file  Other Topics Concern  . Not on file  Social History Narrative  . Not on file   Social Determinants of Health   Financial Resource Strain:   . Difficulty of Paying Living Expenses: Not on file  Food Insecurity:   . Worried About Charity fundraiser in the Last Year: Not on file  . Ran Out of Food in the Last Year: Not on file  Transportation Needs:   . Lack of Transportation (Medical): Not on file  . Lack of Transportation (Non-Medical): Not on file  Physical Activity:   . Days of Exercise per Week: Not on file  . Minutes of Exercise per Session: Not on file  Stress:   . Feeling of Stress : Not on file  Social Connections:   . Frequency of Communication with Friends and Family: Not on file  . Frequency of Social Gatherings with Friends and Family: Not on file  . Attends Religious Services: Not on file  . Active Member of Clubs or Organizations: Not on file  . Attends Archivist Meetings: Not on file  . Marital Status: Not on file  Intimate Partner Violence:   . Fear of Current or Ex-Partner: Not on file  . Emotionally Abused: Not on file  . Physically Abused: Not on file  . Sexually Abused: Not on file   Current Outpatient Medications on File Prior to Visit  Medication Sig Dispense Refill  . amitriptyline (ELAVIL) 10 MG tablet Take 1 tablet (10 mg total) by mouth  at bedtime. 90 tablet 0  . celecoxib (CELEBREX) 200 MG capsule TAKE 1 CAPSULE BY MOUTH TWO TIMES DAILY (Patient taking differently: Take 100 mg by mouth 2 (two) times daily. ) 180 capsule 1  . Cholecalciferol (VITAMIN D) 2000 UNITS CAPS Take 2,000 Units by mouth daily.    Marland Kitchen denosumab (PROLIA) 60 MG/ML SOSY injection Inject 60 mg into the skin every 6 (six) months.    . hydroxychloroquine (PLAQUENIL) 200 MG tablet Take 1 tablet by mouth 2 (two) times daily.    . Melatonin 5 MG TABS Take 5 mg by mouth daily.     No current facility-administered medications on file prior to visit.   No Known Allergies Family History  Problem Relation Age of Onset  . Heart disease Mother   . Breast cancer Neg Hx     PE: BP 122/68   Pulse 90   Ht 5\' 4"  (1.626 m) Comment: measured today without shoes  Wt 159 lb (72.1 kg)   SpO2 98%   BMI 27.29 kg/m  Wt Readings from Last 3 Encounters:  08/08/19 159 lb (72.1 kg)  03/20/19 154 lb (69.9 kg)  01/31/19 144 lb (65.3 kg)   Constitutional: Normal weight, in NAD Eyes: PERRLA, EOMI, no exophthalmos ENT: moist mucous membranes, no thyromegaly, no cervical lymphadenopathy Cardiovascular: RRR, No MRG Respiratory: CTA B Gastrointestinal: abdomen soft, NT, ND, BS+ Musculoskeletal: no deformities, strength intact in all 4 Skin: moist, warm, no rashes Neurological: no tremor with outstretched hands, DTR normal in all 4  Assessment: 1. Osteoporosis  2.  History of vitamin D deficiency  Plan: 1. Osteoporosis  -Likely age-related/postmenopausal, but also possibly due to steroid injections.  She also has family history of osteoporosis -We reviewed together her latest bone density scan from 05/09/2019.  This showed improved BMD at the spine (T score improved from -2.8 to -2.2) and stable scores at the femoral necks.  I explained that this is a good result.  We will need to continue Prolia for now.  She already had 3 injections and she is preparing for the fourth.   She was initially reticent to start Prolia due to her history of pseudogout.  Per insurance preference, she tried Fosamax for 4 weeks, however, this gave her abdominal pain so we were finally able to start Prolia in 03/2018.  She tolerates this well.  She denies any jaw pain, hip/thigh pain. She developed thoracic pain 1 year ago >> saw ortho >> likely not a fracture >> was in PT >> now better -She is maintaining a good amount of calcium and protein in her diet.  She continues to exercise with a personal trainer and does weightbearing exercises.  At previous visit I gave her information about was too strong center for skeletal loading.  She did not try this yet. -At today's visit, we will check a vitamin D and a BMP -She is due for another bone density scan in 05/2021 -I will see her back in a year  2.  History of vitamin D deficiency -Most recent vitamin D levels were reviewed and these were normal -At last visit she was on 2000 units (+1000 units from [facility]) daily and we continued this. -We will recheck her vitamin D at this visit  Office Visit on 08/08/2019  Component Date Value Ref Range Status  . Sodium 08/08/2019 141  135 - 145 mEq/L Final  . Potassium 08/08/2019 4.0  3.5 - 5.1 mEq/L Final  . Chloride 08/08/2019 107  96 - 112 mEq/L Final  . CO2 08/08/2019 27  19 - 32 mEq/L Final  . Glucose, Bld 08/08/2019 104* 70 - 99 mg/dL Final  . BUN 08/08/2019 15  6 - 23 mg/dL Final  . Creatinine, Ser 08/08/2019 0.83  0.40 - 1.20 mg/dL Final  . GFR 08/08/2019 69.64  >60.00 mL/min Final  . Calcium 08/08/2019 9.2  8.4 - 10.5 mg/dL Final  . VITD 08/08/2019 48.13  30.00 - 100.00 ng/mL Final   Labs are normal.  [name], MD PhD [facility]

## 2019-08-07 NOTE — Patient Instructions (Addendum)
Please stop at the lab.  Continue vitamin D 2000 units.  Please come back for a follow-up appointment in 1 year.

## 2019-08-08 ENCOUNTER — Other Ambulatory Visit: Payer: Self-pay

## 2019-08-08 ENCOUNTER — Ambulatory Visit (INDEPENDENT_AMBULATORY_CARE_PROVIDER_SITE_OTHER): Payer: BC Managed Care – PPO | Admitting: Internal Medicine

## 2019-08-08 ENCOUNTER — Encounter: Payer: Self-pay | Admitting: Internal Medicine

## 2019-08-08 VITALS — BP 122/68 | HR 90 | Ht 64.0 in | Wt 159.0 lb

## 2019-08-08 DIAGNOSIS — M810 Age-related osteoporosis without current pathological fracture: Secondary | ICD-10-CM

## 2019-08-08 LAB — BASIC METABOLIC PANEL
BUN: 15 mg/dL (ref 6–23)
CO2: 27 mEq/L (ref 19–32)
Calcium: 9.2 mg/dL (ref 8.4–10.5)
Chloride: 107 mEq/L (ref 96–112)
Creatinine, Ser: 0.83 mg/dL (ref 0.40–1.20)
GFR: 69.64 mL/min (ref >60.00)
Glucose, Bld: 104 mg/dL — ABNORMAL HIGH (ref 70–99)
Potassium: 4 mEq/L (ref 3.5–5.1)
Sodium: 141 mEq/L (ref 135–145)

## 2019-08-08 LAB — VITAMIN D 25 HYDROXY (VIT D DEFICIENCY, FRACTURES): VITD: 48.13 ng/mL (ref 30.00–100.00)

## 2019-08-14 ENCOUNTER — Encounter: Payer: Self-pay | Admitting: Internal Medicine

## 2019-09-10 ENCOUNTER — Ambulatory Visit (INDEPENDENT_AMBULATORY_CARE_PROVIDER_SITE_OTHER): Payer: BC Managed Care – PPO | Admitting: Family Medicine

## 2019-09-10 ENCOUNTER — Encounter: Payer: Self-pay | Admitting: Family Medicine

## 2019-09-10 ENCOUNTER — Other Ambulatory Visit: Payer: Self-pay

## 2019-09-10 ENCOUNTER — Encounter: Payer: Self-pay | Admitting: Internal Medicine

## 2019-09-10 DIAGNOSIS — M999 Biomechanical lesion, unspecified: Secondary | ICD-10-CM

## 2019-09-10 DIAGNOSIS — Z8739 Personal history of other diseases of the musculoskeletal system and connective tissue: Secondary | ICD-10-CM

## 2019-09-10 DIAGNOSIS — M5090 Cervical disc disorder, unspecified, unspecified cervical region: Secondary | ICD-10-CM | POA: Diagnosis not present

## 2019-09-10 DIAGNOSIS — M940 Chondrocostal junction syndrome [Tietze]: Secondary | ICD-10-CM

## 2019-09-10 MED ORDER — GABAPENTIN 100 MG PO CAPS: 200.0000 mg | ORAL_CAPSULE | Freq: Every day | ORAL | 3 refills | Status: DC

## 2019-09-10 NOTE — Assessment & Plan Note (Signed)
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  Responded well to manipulation.  Discussed discontinuing the possibility of Plaquenil procedure daily in the near future.  Follow-up again in 5 to 6 weeks

## 2019-09-10 NOTE — Assessment & Plan Note (Signed)
Mild signs of degenerative disc disease and mild radicular symptoms on the right side with Spurling's.  Gabapentin started today.

## 2019-09-10 NOTE — Patient Instructions (Addendum)
Continue vitamin d 2000IU daily Tart cherry extract 1200mg  at night Ice after activity Keep hands in peripheral vision with all exercises See me in 5-6 weeks, will discuss discontinuing plaquenil

## 2019-09-10 NOTE — Assessment & Plan Note (Signed)
Consider at follow-up discontinuing Plaquenil and starting allopurinol

## 2019-09-10 NOTE — Assessment & Plan Note (Signed)

## 2019-09-10 NOTE — Progress Notes (Signed)
[place] Phone: [phone] Subjective:   Fontaine No, am serving as a scribe for Dr. Hulan Saas. This visit occurred during the SARS-CoV-2 public health emergency.  Safety protocols were in place, including screening questions prior to the visit, additional usage of staff PPE, and extensive cleaning of exam room while observing appropriate contact time as indicated for disinfecting solutions.   I'm seeing this patient by the request  of:  Baxley, Cresenciano Lick, MD  CC: Upper back pain  [id number]  [name] is a 64 y.o. female coming in with complaint of thoracic spine pain for one year directly over her spine. Pain is dull and can radiate into right scapula and right arm. Denies any tingling. Patient recalls rolling over in bed as the cause of her pain. Pain has not improved. Has tried physical therapy. Takes Celebrex 2x a day.  Patient has been diagnosed with CPPD and is on Plaquenil for this.  Works with a Clinical research associate 3 times a week    Past Medical History:  Diagnosis Date  . Allergy   . Arthritis   . Complication of anesthesia   . H/O calcium pyrophosphate deposition disease (CPPD)   . Migraine   . PONV (postoperative nausea and vomiting)    Past Surgical History:  Procedure Laterality Date  . BUNIONECTOMY Left   . CERVICAL DISCECTOMY     C3-4  . EYE SURGERY Bilateral    LASIK  . JOINT REPLACEMENT Bilateral    knees  . KNEE ARTHROSCOPY Left   . KNEE ARTHROSCOPY Right 05/14/2013   Procedure: RIGHT ARTHROSCOPY KNEE;  Surgeon: Gearlean Alf, MD;  Location: [facility];  Service: Orthopedics;  Laterality: Right;   Social History   Socioeconomic History  . Marital status: Married    Spouse name: Not on file  . Number of children: Not on file  . Years of education: Not on file  . Highest education level: Not on file  Occupational History  . Not on file  Tobacco Use  . Smoking status: Never  Smoker  . Smokeless tobacco: Never Used  Substance and Sexual Activity  . Alcohol use: Yes    Comment: socially  . Drug use: No  . Sexual activity: Not on file  Other Topics Concern  . Not on file  Social History Narrative  . Not on file   Social Determinants of Health   Financial Resource Strain:   . Difficulty of Paying Living Expenses:   Food Insecurity:   . Worried About Charity fundraiser in the Last Year:   . Arboriculturist in the Last Year:   Transportation Needs:   . Film/video editor (Medical):   Marland Kitchen Lack of Transportation (Non-Medical):   Physical Activity:   . Days of Exercise per Week:   . Minutes of Exercise per Session:   Stress:   . Feeling of Stress :   Social Connections:   . Frequency of Communication with Friends and Family:   . Frequency of Social Gatherings with Friends and Family:   . Attends Religious Services:   . Active Member of Clubs or Organizations:   . Attends Archivist Meetings:   Marland Kitchen Marital Status:    No Known Allergies Family History  Problem Relation Age of Onset  . Heart disease Mother   . Breast cancer Neg Hx     Current Outpatient Medications (Endocrine & Metabolic):  .  denosumab (PROLIA) 60 MG/ML SOSY injection, Inject 60 mg into the skin every 6 (six) months.    Current Outpatient Medications (Analgesics):  .  celecoxib (CELEBREX) 200 MG capsule, TAKE 1 CAPSULE BY MOUTH TWO TIMES DAILY (Patient taking differently: Take 100 mg by mouth 2 (two) times daily. )   Current Outpatient Medications (Other):  Marland Kitchen  Cholecalciferol (VITAMIN D) 2000 UNITS CAPS, Take 2,000 Units by mouth daily. .  hydroxychloroquine (PLAQUENIL) 200 MG tablet, Take 1 tablet by mouth 2 (two) times daily. Marland Kitchen  gabapentin (NEURONTIN) 100 MG capsule, Take 2 capsules (200 mg total) by mouth at bedtime.   Reviewed prior external information including notes and imaging from  primary care provider As well as notes that were available from care  everywhere and other healthcare systems.  Past medical history, social, surgical and family history all reviewed in electronic medical record.  No pertanent information unless stated regarding to the chief complaint.   Review of Systems:  No headache, visual changes, nausea, vomiting, diarrhea, constipation, dizziness, abdominal pain, skin rash, fevers, chills, night sweats, weight loss, swollen lymph nodes, body aches, joint swelling, chest pain, shortness of breath, mood changes. POSITIVE muscle aches  Objective  Blood pressure 104/74, pulse 63, height 5\' 4"  (1.626 m), weight 154 lb (69.9 kg), SpO2 99 %.   General: No apparent distress alert and oriented x3 mood and affect normal, dressed appropriately.  HEENT: Pupils equal, extraocular movements intact  Respiratory: Patient's speak in full sentences and does not appear short of breath  Cardiovascular: No lower extremity edema, non tender, no erythema  Neuro: Cranial nerves II through XII are intact, neurovascularly intact in all extremities with 2+ DTRs and 2+ pulses.  Gait normal with good balance and coordination.  MSK:  tender with mild limited range of motion and good stability and symmetric strength and tone of shoulders, elbows, wrist, hip, knee and ankles bilaterally.  Neck exam does have some mild loss of lordosis.  Some mild tightness with extension especially on the right side with mild radicular symptoms in the C8 distribution.  Patient does have a 5 out of 5 strength.  Deep tendon reflexes intact. Mild scapular dyskinesis right greater than left.  Osteopathic findings  C2 flexed rotated and side bent right T3 extended rotated and side bent right inhaled third rib   Impression and Recommendations:     This case required medical decision making of moderate complexity. The above documentation has been reviewed and is accurate and complete Lyndal Pulley, DO       Note: This dictation was prepared with Dragon dictation  along with smaller phrase technology. Any transcriptional errors that result from this process are unintentional.

## 2019-09-16 NOTE — Telephone Encounter (Signed)
Pt has been submitted to the portal just awaiting SOB

## 2019-10-13 ENCOUNTER — Telehealth: Payer: Self-pay | Admitting: Internal Medicine

## 2019-10-13 ENCOUNTER — Encounter: Payer: Self-pay | Admitting: Internal Medicine

## 2019-10-13 NOTE — Telephone Encounter (Signed)
Pt is cleared for Prolia PA is on file until 12/07/19 # is [id number] Owes 10% Clear to schedule at any time Have reached out to pt requesting a call to set up appt

## 2019-10-14 NOTE — Telephone Encounter (Signed)
This has been noted in Prolia book and will be contacted again.

## 2019-10-14 NOTE — Telephone Encounter (Signed)
Noted, Appt note addended to reflect how much pt owes.

## 2019-10-14 NOTE — Telephone Encounter (Signed)
Patient called to schedule her Prolia shot - she is scheduled for tomorrow 10-15-2019   I did not know how much %10 percent was so I just noted 10% due

## 2019-10-15 ENCOUNTER — Ambulatory Visit: Payer: BC Managed Care – PPO | Admitting: Family Medicine

## 2019-10-15 ENCOUNTER — Other Ambulatory Visit: Payer: Self-pay

## 2019-10-15 ENCOUNTER — Ambulatory Visit: Payer: BC Managed Care – PPO

## 2019-10-15 DIAGNOSIS — M810 Age-related osteoporosis without current pathological fracture: Secondary | ICD-10-CM | POA: Diagnosis not present

## 2019-10-15 MED ORDER — DENOSUMAB 60 MG/ML ~~LOC~~ SOSY
60.0000 mg | PREFILLED_SYRINGE | Freq: Once | SUBCUTANEOUS | Status: AC
  Administered 2019-10-15: 60 mg via SUBCUTANEOUS

## 2019-10-15 NOTE — Progress Notes (Signed)
Per orders of Dr. Gherghe injection of Prolia given today by N.Vonzell Lindblad,LPN. Patient tolerated injection well.  

## 2019-10-17 ENCOUNTER — Encounter: Payer: Self-pay | Admitting: Family Medicine

## 2019-10-17 ENCOUNTER — Ambulatory Visit (INDEPENDENT_AMBULATORY_CARE_PROVIDER_SITE_OTHER)
Admission: RE | Admit: 2019-10-17 | Discharge: 2019-10-17 | Disposition: A | Discharge status: Home / Self Care | Payer: BC Managed Care – PPO | Source: Ambulatory Visit | Attending: Family Medicine | Admitting: Family Medicine

## 2019-10-17 ENCOUNTER — Ambulatory Visit (INDEPENDENT_AMBULATORY_CARE_PROVIDER_SITE_OTHER): Payer: BC Managed Care – PPO | Admitting: Family Medicine

## 2019-10-17 ENCOUNTER — Other Ambulatory Visit: Payer: Self-pay

## 2019-10-17 VITALS — BP 140/80 | HR 72 | Ht 64.0 in | Wt 152.0 lb

## 2019-10-17 DIAGNOSIS — M542 Cervicalgia: Secondary | ICD-10-CM

## 2019-10-17 DIAGNOSIS — M5090 Cervical disc disorder, unspecified, unspecified cervical region: Secondary | ICD-10-CM

## 2019-10-17 DIAGNOSIS — M810 Age-related osteoporosis without current pathological fracture: Secondary | ICD-10-CM

## 2019-10-17 DIAGNOSIS — M25511 Pain in right shoulder: Secondary | ICD-10-CM | POA: Diagnosis not present

## 2019-10-17 DIAGNOSIS — G8929 Other chronic pain: Secondary | ICD-10-CM

## 2019-10-17 DIAGNOSIS — M546 Pain in thoracic spine: Secondary | ICD-10-CM

## 2019-10-17 DIAGNOSIS — M50322 Other cervical disc degeneration at C5-C6 level: Secondary | ICD-10-CM | POA: Diagnosis not present

## 2019-10-17 DIAGNOSIS — M47812 Spondylosis without myelopathy or radiculopathy, cervical region: Secondary | ICD-10-CM | POA: Diagnosis not present

## 2019-10-17 NOTE — Assessment & Plan Note (Signed)
Trigger point right shoulder 3 of them.  Injected today.  Patient had numbness with some mild improvement in range of motion of the neck almost immediately.  History of osteoporosis so we will monitor as well.  Discussed icing regimen.  Topical anti-inflammatories.  Follow-up again in 4 to 8 weeks

## 2019-10-17 NOTE — Assessment & Plan Note (Signed)
History of the cervical disc disease.  Questionable radicular symptoms.  Patient though is having symptoms that are also consistent with a potential either facet arthropathy of the cervical spine or potentially a syrinx.  Patient does not make significant improvement after the trigger point injections I would encourage patient to potentially consider advanced imaging.  Continue the gabapentin otherwise.  Follow-up again in 4 to 8 weeks

## 2019-10-17 NOTE — Progress Notes (Signed)
[place] [address] [place] Phone: [phone] Subjective:   I [name] am serving as a Education administrator for Dr. Hulan Saas.  This visit occurred during the SARS-CoV-2 public health emergency.  Safety protocols were in place, including screening questions prior to the visit, additional usage of staff PPE, and extensive cleaning of exam room while observing appropriate contact time as indicated for disinfecting solutions.   I'm seeing this patient by the request  of:  Baxley, Cresenciano Lick, MD  CC: Neck and upper back pain follow-up [id number]  [name] is a 64 y.o. female coming in with complaint of neck pain. Last seen 09/10/2019 for OMT. Patient believes she is doing worse today. Pain is mostly in the middle with less radiating pain to the shoulder blades. States she was in pain after the adjustment. Gabapentin seems to be helping other joint pain.  Patient continues to have discomfort and pain overall.  Patient has been taking the Celebrex twice a day but continues to needed.  Patient feels like she is not making any improvement and if anything seems to be worsening.    Past Medical History:  Diagnosis Date  . Allergy   . Arthritis   . Complication of anesthesia   . H/O calcium pyrophosphate deposition disease (CPPD)   . Migraine   . PONV (postoperative nausea and vomiting)    Past Surgical History:  Procedure Laterality Date  . BUNIONECTOMY Left   . CERVICAL DISCECTOMY     C3-4  . EYE SURGERY Bilateral    LASIK  . JOINT REPLACEMENT Bilateral    knees  . KNEE ARTHROSCOPY Left   . KNEE ARTHROSCOPY Right 05/14/2013   Procedure: RIGHT ARTHROSCOPY KNEE;  Surgeon: Gearlean Alf, MD;  Location: [facility];  Service: Orthopedics;  Laterality: Right;   Social History   Socioeconomic History  . Marital status: Married    Spouse name: Not on file  . Number of children: Not on file  . Years of education: Not on file  .  Highest education level: Not on file  Occupational History  . Not on file  Tobacco Use  . Smoking status: Never Smoker  . Smokeless tobacco: Never Used  Substance and Sexual Activity  . Alcohol use: Yes    Comment: socially  . Drug use: No  . Sexual activity: Not on file  Other Topics Concern  . Not on file  Social History Narrative  . Not on file   Social Determinants of Health   Financial Resource Strain:   . Difficulty of Paying Living Expenses:   Food Insecurity:   . Worried About Charity fundraiser in the Last Year:   . Arboriculturist in the Last Year:   Transportation Needs:   . Film/video editor (Medical):   Marland Kitchen Lack of Transportation (Non-Medical):   Physical Activity:   . Days of Exercise per Week:   . Minutes of Exercise per Session:   Stress:   . Feeling of Stress :   Social Connections:   . Frequency of Communication with Friends and Family:   . Frequency of Social Gatherings with Friends and Family:   . Attends Religious Services:   . Active Member of Clubs or Organizations:   . Attends Archivist Meetings:   Marland Kitchen Marital Status:    No Known Allergies Family History  Problem Relation Age of Onset  . Heart disease Mother   .  Breast cancer Neg Hx     Current Outpatient Medications (Endocrine & Metabolic):  .  denosumab (PROLIA) 60 MG/ML SOSY injection, Inject 60 mg into the skin every 6 (six) months.    Current Outpatient Medications (Analgesics):  .  celecoxib (CELEBREX) 200 MG capsule, TAKE 1 CAPSULE BY MOUTH TWO TIMES DAILY (Patient taking differently: Take 100 mg by mouth 2 (two) times daily. )   Current Outpatient Medications (Other):  Marland Kitchen  Cholecalciferol (VITAMIN D) 2000 UNITS CAPS, Take 2,000 Units by mouth daily. Marland Kitchen  gabapentin (NEURONTIN) 100 MG capsule, Take 2 capsules (200 mg total) by mouth at bedtime.   Reviewed prior external information including notes and imaging from  primary care provider As well as notes that were  available from care everywhere and other healthcare systems.  Past medical history, social, surgical and family history all reviewed in electronic medical record.  No pertanent information unless stated regarding to the chief complaint.   Review of Systems:  No headache, visual changes, nausea, vomiting, diarrhea, constipation, dizziness, abdominal pain, skin rash, fevers, chills, night sweats, weight loss, swollen lymph nodes, body aches, joint swelling, chest pain, shortness of breath, mood changes. POSITIVE muscle aches  Objective  Blood pressure 140/80, pulse 72, height 5\' 4"  (1.626 m), weight 152 lb (68.9 kg), SpO2 98 %.   General: No apparent distress alert and oriented x3 mood and affect normal, dressed appropriately.  HEENT: Pupils equal, extraocular movements intact  Respiratory: Patient's speak in full sentences and does not appear short of breath  Cardiovascular: No lower extremity edema, non tender, no erythema  Neuro: Cranial nerves II through XII are intact, neurovascularly intact in all extremities with 2+ DTRs and 2+ pulses.  Gait normal with good balance and coordination.  MSK:  Non tender with full range of motion and good stability and symmetric strength and tone of  elbows, wrist, hip, knee and ankles bilaterally.  Neck exam shows worsening pain for the last 10 degrees of extension.  Patient states that the pain seems to be more in the thoracic spine and more midline patient does have some mild tightness noted as well with flexion of the neck.  Patient denies any radicular symptoms noted today.  Severe trigger points noted in the trapezius rhomboid and levator scapula on the right side of the shoulder region.  After verbal consent patient was prepped with alcohol swabs and with a 25-gauge half inch needle injected into 3 distinct trigger points in the rhomboid, with trapezius and levator scapula.  Total of 3 cc of 0.5% Marcaine and 1 cc of Kenalog 40 mg/mL no blood loss.   Band-Aids placed.   Impression and Recommendations:     This case required medical decision making of moderate complexity. The above documentation has been reviewed and is accurate and complete Lyndal Pulley, DO       Note: This dictation was prepared with Dragon dictation along with smaller phrase technology. Any transcriptional errors that result from this process are unintentional.

## 2019-10-17 NOTE — Patient Instructions (Addendum)
Neck xray at [facility] Call Monday and let me know how you are doing we will get MRI

## 2019-10-17 NOTE — Assessment & Plan Note (Signed)
Recently received Prolia and seems to be doing well.

## 2019-10-28 ENCOUNTER — Encounter: Payer: Self-pay | Admitting: Family Medicine

## 2019-11-14 ENCOUNTER — Ambulatory Visit (INDEPENDENT_AMBULATORY_CARE_PROVIDER_SITE_OTHER): Payer: BC Managed Care – PPO | Admitting: Family Medicine

## 2019-11-14 ENCOUNTER — Other Ambulatory Visit: Payer: Self-pay

## 2019-11-14 ENCOUNTER — Encounter: Payer: Self-pay | Admitting: Family Medicine

## 2019-11-14 VITALS — BP 112/82 | HR 73 | Ht 64.0 in | Wt 149.0 lb

## 2019-11-14 DIAGNOSIS — M2550 Pain in unspecified joint: Secondary | ICD-10-CM

## 2019-11-14 DIAGNOSIS — M5090 Cervical disc disorder, unspecified, unspecified cervical region: Secondary | ICD-10-CM

## 2019-11-14 LAB — IBC PANEL
Iron: 123 ug/dL (ref 42–145)
Saturation Ratios: 35.1 % (ref 20.0–50.0)
Transferrin: 250 mg/dL (ref 212.0–360.0)

## 2019-11-14 LAB — URIC ACID: Uric Acid, Serum: 4.2 mg/dL (ref 2.4–7.0)

## 2019-11-14 LAB — SEDIMENTATION RATE: Sed Rate: 6 mm/hr (ref 0–30)

## 2019-11-14 LAB — FERRITIN: Ferritin: 76.2 ng/mL (ref 10.0–291.0)

## 2019-11-14 NOTE — Assessment & Plan Note (Addendum)
Known arthritic changes.  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  We discussed with patient about different medications including gabapentin and other neuromodulators.  Discussed the family of possible SNRIs as a possibility to help with pain and nerve pain.  Patient will consider this.  We discussed laboratory work-up though secondary to the amount of pain patient is having.  I do believe that it will be helpful.  Depending on laboratory work-up we discussed the possibility of advanced imaging but patient does not think she would consider the possibility of epidurals at this moment.  Only other thing would be to look for any type of cyst formation that would be noted in the cervical or thoracic spine but I think this would be highly unlikely.  Continue with check in his in 4 to 6 weeks.  Total time with patient today reviewing chart, images and discussing with patient 32 minutes

## 2019-11-14 NOTE — Patient Instructions (Addendum)
Up gabapentin 3 pills at night for next week Will refill Celebrex as needed [facility] food testing Labs today See me in 4 weeks

## 2019-11-14 NOTE — Progress Notes (Signed)
  [place] Phone: [phone] Subjective:    I'm seeing this patient by the request  of:  Baxley, Cresenciano Lick, MD  CC: Neck and back pain follow-up  [id number]  [name] is a 64 y.o. female coming in with complaint of back and neck pain. OMT 10/17/2019. Patient states that it was better for 2 1/2 weeks after the injections but now it is back to its base line of pain. Unsure if wanting OMT today. Patient is wondering what else to do.  Patient feels like she is just not making significant progress.  Does have a history of CPPD and has been doing some mopping vitamin D and Celebrex seems to keep it somewhat normal.         Reviewed prior external information including notes and imaging from previsou exam, outside providers and external EMR if available.   As well as notes that were available from care everywhere and other healthcare systems.  Past medical history, social, surgical and family history all reviewed in electronic medical record.  No pertanent information unless stated regarding to the chief complaint.   Past Medical History:  Diagnosis Date  . Allergy   . Arthritis   . Complication of anesthesia   . H/O calcium pyrophosphate deposition disease (CPPD)   . Migraine   . PONV (postoperative nausea and vomiting)     No Known Allergies   Review of Systems:  No headache, visual changes, nausea, vomiting, diarrhea, constipation, dizziness, abdominal pain, skin rash, fevers, chills, night sweats, weight loss, swollen lymph nodes, chest pain, shortness of breath, mood changes. POSITIVE muscle aches, body aches, intermittent joint swelling  Objective  Blood pressure 112/82, pulse 73, height 5\' 4"  (1.626 m), weight 149 lb (67.6 kg), SpO2 99 %.   General: No apparent distress alert and oriented x3 mood and affect normal, dressed appropriately.  HEENT: Pupils equal, extraocular movements intact    Respiratory: Patient's speak in full sentences and does not appear short of breath  Cardiovascular: No lower extremity edema, non tender, no erythema  Neuro: Cranial nerves II through XII are intact, neurovascularly intact in all extremities with 2+ DTRs and 2+ pulses.  Gait normal with good balance and coordination.  MSK:  Non tender with full range of motion and good stability and symmetric strength and tone of shoulders, elbows, wrist, hip, knee and ankles bilaterally.  Neck exam does have some mild loss of lordosis.  Tender to palpation in the paraspinal musculature of the neck and mild over the shoulders bilaterally.  No significant strength testing done today.        Assessment and Plan:         The above documentation has been reviewed and is accurate and complete Lyndal Pulley, DO       Note: This dictation was prepared with Dragon dictation along with smaller phrase technology. Any transcriptional errors that result from this process are unintentional.

## 2019-11-17 LAB — RHEUMATOID FACTOR: Rheumatoid fact SerPl-aCnc: <14 IU/mL (ref <14)

## 2019-11-17 LAB — PTH, INTACT AND CALCIUM
Calcium: 9.6 mg/dL (ref 8.6–10.4)
PTH: 14 pg/mL (ref 14–64)

## 2019-11-17 LAB — ANA: Anti Nuclear Antibody (ANA): NEGATIVE

## 2019-11-17 LAB — CALCIUM, IONIZED: Calcium, Ion: 5.12 mg/dL (ref 4.8–5.6)

## 2019-12-01 ENCOUNTER — Other Ambulatory Visit: Payer: Self-pay

## 2019-12-01 MED ORDER — GABAPENTIN 100 MG PO CAPS: 200.0000 mg | ORAL_CAPSULE | Freq: Every day | ORAL | 3 refills | Status: DC

## 2019-12-09 DIAGNOSIS — L821 Other seborrheic keratosis: Secondary | ICD-10-CM | POA: Diagnosis not present

## 2019-12-09 DIAGNOSIS — D225 Melanocytic nevi of trunk: Secondary | ICD-10-CM | POA: Diagnosis not present

## 2019-12-09 DIAGNOSIS — L812 Freckles: Secondary | ICD-10-CM | POA: Diagnosis not present

## 2019-12-09 DIAGNOSIS — D1801 Hemangioma of skin and subcutaneous tissue: Secondary | ICD-10-CM | POA: Diagnosis not present

## 2019-12-16 ENCOUNTER — Ambulatory Visit: Payer: BC Managed Care – PPO | Admitting: Family Medicine

## 2019-12-26 ENCOUNTER — Telehealth: Payer: Self-pay | Admitting: Internal Medicine

## 2019-12-26 ENCOUNTER — Other Ambulatory Visit: Payer: Self-pay

## 2019-12-26 ENCOUNTER — Encounter: Payer: Self-pay | Admitting: Internal Medicine

## 2019-12-26 ENCOUNTER — Ambulatory Visit (INDEPENDENT_AMBULATORY_CARE_PROVIDER_SITE_OTHER): Payer: BC Managed Care – PPO | Admitting: Internal Medicine

## 2019-12-26 VITALS — BP 120/80 | HR 76 | Temp 98.0°F | Ht 64.0 in | Wt 148.0 lb

## 2019-12-26 DIAGNOSIS — M545 Low back pain, unspecified: Secondary | ICD-10-CM

## 2019-12-26 DIAGNOSIS — R350 Frequency of micturition: Secondary | ICD-10-CM

## 2019-12-26 LAB — POCT URINALYSIS DIPSTICK
Appearance: NEGATIVE
Bilirubin, UA: NEGATIVE
Blood, UA: NEGATIVE
Glucose, UA: NEGATIVE
Ketones, UA: NEGATIVE
Leukocytes, UA: NEGATIVE
Nitrite, UA: NEGATIVE
Odor: NEGATIVE
Protein, UA: NEGATIVE
Spec Grav, UA: 1.01 (ref 1.010–1.025)
Urobilinogen, UA: 0.2 E.U./dL
pH, UA: 6.5 (ref 5.0–8.0)

## 2019-12-26 MED ORDER — NITROFURANTOIN MONOHYD MACRO 100 MG PO CAPS
100.0000 mg | ORAL_CAPSULE | Freq: Two times a day (BID) | ORAL | 0 refills | Status: DC
Start: 2019-12-26 — End: 2020-03-24

## 2019-12-26 NOTE — Telephone Encounter (Signed)
May come right now

## 2019-12-26 NOTE — Patient Instructions (Addendum)
Macrobid 100 mg twice a day for 10 days. Urine culture pending. It was a pleasure to see you today.

## 2019-12-26 NOTE — Telephone Encounter (Signed)
[name] [phone]  [name] called to say she has had a headache all week and she has urgency to go every now and then. She did one of the home urine test for UTI and it showed cloudy after about 4 minutes. She is getting ready to go out of town to take care of her grandchildren, would like to come by as soon as possible for urine check.

## 2019-12-26 NOTE — Telephone Encounter (Signed)
Patient said she will be here soon.

## 2019-12-26 NOTE — Progress Notes (Signed)
   Subjective:    Patient ID: [name], female    DOB: [birth year], 64 y.o.   MRN: [id number]  HPI 64 year old Female leaving to go out of town had onset of dysuria today. Did OTC urine test that turned positive. Having some dysuria. Last seen for UTI symptoms 2019 and treated with Macrobid. No fever, chills, nausea, or vomiting. No CVA pain.    Review of Systems see above     Objective:   Physical Exam  BP 120/80 pulse 76 Temp 98 degrees Weight 148 pounds Urine dipstick shows no LE or nitrite. Culture sent     Assessment & Plan:  Dysuria Macrobid 100 mg twice daily x 10 days. Culture pending. I would like for her to have med since she will be out of town in [place]. Since she is symptomatic, would take med at least 5-7 days pending culture results.

## 2019-12-27 LAB — URINE CULTURE
MICRO NUMBER:: 10742518
Result:: NO GROWTH
SPECIMEN QUALITY:: ADEQUATE

## 2020-01-08 ENCOUNTER — Ambulatory Visit (INDEPENDENT_AMBULATORY_CARE_PROVIDER_SITE_OTHER): Payer: BC Managed Care – PPO | Admitting: Family Medicine

## 2020-01-08 ENCOUNTER — Encounter: Payer: Self-pay | Admitting: Family Medicine

## 2020-01-08 ENCOUNTER — Other Ambulatory Visit: Payer: Self-pay

## 2020-01-08 DIAGNOSIS — M5090 Cervical disc disorder, unspecified, unspecified cervical region: Secondary | ICD-10-CM | POA: Diagnosis not present

## 2020-01-08 DIAGNOSIS — Z8739 Personal history of other diseases of the musculoskeletal system and connective tissue: Secondary | ICD-10-CM | POA: Diagnosis not present

## 2020-01-08 MED ORDER — CELECOXIB 100 MG PO CAPS
100.0000 mg | ORAL_CAPSULE | Freq: Two times a day (BID) | ORAL | 3 refills | Status: DC
Start: 2020-01-08 — End: 2020-11-29

## 2020-01-08 NOTE — Progress Notes (Signed)
[place] Phone: [phone] Subjective:   Fontaine No, am serving as a scribe for Dr. Hulan Saas. This visit occurred during the SARS-CoV-2 public health emergency.  Safety protocols were in place, including screening questions prior to the visit, additional usage of staff PPE, and extensive cleaning of exam room while observing appropriate contact time as indicated for disinfecting solutions.   I'm seeing this patient by the request  of:  Baxley, Mary J, MD  CC: Back and neck pain follow-up  [id number]   11/14/2019 Known arthritic changes.  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  We discussed with patient about different medications including gabapentin and other neuromodulators.  Discussed the family of possible SNRIs as a possibility to help with pain and nerve pain.  Patient will consider this.  We discussed laboratory work-up though secondary to the amount of pain patient is having.  I do believe that it will be helpful.  Depending on laboratory work-up we discussed the possibility of advanced imaging but patient does not think she would consider the possibility of epidurals at this moment.  Only other thing would be to look for any type of cyst formation that would be noted in the cervical or thoracic spine but I think this would be highly unlikely.  Continue with check in his in 4 to 6 weeks.  Total time with patient today reviewing chart, images and discussing with patient 32 minutes  Update 01/08/2020 TRYSTEN BERTI is a 64 y.o. female coming in with complaint of neck pain. Patient states that her pain has improved. Still feels soreness in one specific spot but it is much less than her intial visit. Scapular pain has diminished.   Needs rx for Celebrex for CPPD. Using 200mg  BID. Would like to use 100mg  BID.      Past Medical History:  Diagnosis Date    . Allergy   . Arthritis   . Complication of anesthesia   . H/O calcium pyrophosphate deposition disease (CPPD)   . Migraine   . PONV (postoperative nausea and vomiting)    Past Surgical History:  Procedure Laterality Date  . BUNIONECTOMY Left   . CERVICAL DISCECTOMY     C3-4  . EYE SURGERY Bilateral    LASIK  . JOINT REPLACEMENT Bilateral    knees  . KNEE ARTHROSCOPY Left   . KNEE ARTHROSCOPY Right 05/14/2013   Procedure: RIGHT ARTHROSCOPY KNEE;  Surgeon: Gearlean Alf, MD;  Location: [facility];  Service: Orthopedics;  Laterality: Right;   Social History   Socioeconomic History  . Marital status: Married    Spouse name: Not on file  . Number of children: Not on file  . Years of education: Not on file  . Highest education level: Not on file  Occupational History  . Not on file  Tobacco Use  . Smoking status: Never Smoker  . Smokeless tobacco: Never Used  Substance and Sexual Activity  . Alcohol use: Yes    Comment: socially  . Drug use: No  . Sexual activity: Not on file  Other Topics Concern  . Not on file  Social History Narrative  . Not on file   Social Determinants of Health   Financial Resource Strain:   . Difficulty of Paying Living Expenses:   Food Insecurity:   . Worried About Charity fundraiser in the Last Year:   .  Ran Out of Food in the Last Year:   Transportation Needs:   . Film/video editor (Medical):   Marland Kitchen Lack of Transportation (Non-Medical):   Physical Activity:   . Days of Exercise per Week:   . Minutes of Exercise per Session:   Stress:   . Feeling of Stress :   Social Connections:   . Frequency of Communication with Friends and Family:   . Frequency of Social Gatherings with Friends and Family:   . Attends Religious Services:   . Active Member of Clubs or Organizations:   . Attends Archivist Meetings:   Marland Kitchen Marital Status:    No Known Allergies Family History  Problem Relation Age of Onset  . Heart disease Mother    . Breast cancer Neg Hx     Current Outpatient Medications (Endocrine & Metabolic):  .  denosumab (PROLIA) 60 MG/ML SOSY injection, Inject 60 mg into the skin every 6 (six) months.    Current Outpatient Medications (Analgesics):  .  celecoxib (CELEBREX) 200 MG capsule, TAKE 1 CAPSULE BY MOUTH TWO TIMES DAILY (Patient taking differently: Take 100 mg by mouth 2 (two) times daily. ) .  celecoxib (CELEBREX) 100 MG capsule, Take 1 capsule (100 mg total) by mouth 2 (two) times daily.   Current Outpatient Medications (Other):  Marland Kitchen  Cholecalciferol (VITAMIN D) 2000 UNITS CAPS, Take 2,000 Units by mouth daily. Marland Kitchen  gabapentin (NEURONTIN) 100 MG capsule, Take 2 capsules (200 mg total) by mouth at bedtime. .  nitrofurantoin, macrocrystal-monohydrate, (MACROBID) 100 MG capsule, Take 1 capsule (100 mg total) by mouth 2 (two) times daily.   Reviewed prior external information including notes and imaging from  primary care provider As well as notes that were available from care everywhere and other healthcare systems.  Past medical history, social, surgical and family history all reviewed in electronic medical record.  No pertanent information unless stated regarding to the chief complaint.   Review of Systems:  No headache, visual changes, nausea, vomiting, diarrhea, constipation, dizziness, abdominal pain, skin rash, fevers, chills, night sweats, weight loss, swollen lymph nodes, body aches, joint swelling, chest pain, shortness of breath, mood changes. POSITIVE muscle aches  Objective  Blood pressure 110/80, pulse 75, height 5\' 4"  (1.626 m), weight 145 lb (65.8 kg), SpO2 98 %.   General: No apparent distress alert and oriented x3 mood and affect normal, dressed appropriately.  HEENT: Pupils equal, extraocular movements intact  Respiratory: Patient's speak in full sentences and does not appear short of breath  Cardiovascular: No lower extremity edema, non tender, no erythema  Neuro: Cranial nerves  II through XII are intact, neurovascularly intact in all extremities with 2+ DTRs and 2+ pulses.  Gait normal with good balance and coordination.  MSK:  Non tender with full range of motion and good stability and symmetric strength and tone of shoulders, elbows, wrist, hip, knee and ankles bilaterally.  Neck exam still has some very mild tightness in the right side.  Negative Spurling's.  Lacks last 5 degrees of extension.  Tightness noted in the parascapular region.   Impression and Recommendations:     The above documentation has been reviewed and is accurate and complete Lyndal Pulley, DO       Note: This dictation was prepared with Dragon dictation along with smaller phrase technology. Any transcriptional errors that result from this process are unintentional.

## 2020-01-08 NOTE — Patient Instructions (Signed)
Happy you are doing better [facility] is only nut for you Consider more Omega 3 than Omega 6 Choline 500mg  daily See me in 3 months

## 2020-01-08 NOTE — Assessment & Plan Note (Signed)
Known disc disease but is doing relatively well.  Patient states it is very minor.  Doing the exercises regularly.  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  Follow-up again in 3 months

## 2020-01-08 NOTE — Assessment & Plan Note (Signed)
Patient's food sensitivity does show that there is some signs with the increase of nuts and likely does have some allergy to this and contributing to the CPPD.  Refilled patient's Celebrex at this time and I think is going to do relatively well.  Continue to do the same treatment for her osteoporosis.  Did not respond well to osteopathic manipulation but will continue to consider this in the future.  Follow-up again 4 to 8 weeks

## 2020-01-21 DIAGNOSIS — Z20822 Contact with and (suspected) exposure to covid-19: Secondary | ICD-10-CM | POA: Diagnosis not present

## 2020-01-29 ENCOUNTER — Other Ambulatory Visit: Payer: Self-pay

## 2020-01-29 ENCOUNTER — Telehealth: Payer: Self-pay | Admitting: Family Medicine

## 2020-01-29 ENCOUNTER — Other Ambulatory Visit: Payer: Self-pay | Admitting: Family Medicine

## 2020-01-29 MED ORDER — GABAPENTIN 100 MG PO CAPS: 200.0000 mg | ORAL_CAPSULE | Freq: Every day | ORAL | 3 refills | Status: DC

## 2020-01-29 NOTE — Telephone Encounter (Signed)
[facility] called requesting a refill on gabapentin (NEURONTIN) 100 MG capsule (90 day supply) for the patient.

## 2020-01-29 NOTE — Telephone Encounter (Signed)
Refilled and patient notified 

## 2020-02-17 DIAGNOSIS — D3709 Neoplasm of uncertain behavior of other specified sites of the oral cavity: Secondary | ICD-10-CM | POA: Diagnosis not present

## 2020-03-24 ENCOUNTER — Other Ambulatory Visit: Payer: Self-pay

## 2020-03-24 ENCOUNTER — Ambulatory Visit (INDEPENDENT_AMBULATORY_CARE_PROVIDER_SITE_OTHER): Payer: BC Managed Care – PPO | Admitting: Podiatry

## 2020-03-24 ENCOUNTER — Encounter: Payer: Self-pay | Admitting: Podiatry

## 2020-03-24 ENCOUNTER — Encounter: Payer: Self-pay | Admitting: Internal Medicine

## 2020-03-24 ENCOUNTER — Other Ambulatory Visit: Payer: Self-pay | Admitting: Podiatry

## 2020-03-24 ENCOUNTER — Ambulatory Visit (INDEPENDENT_AMBULATORY_CARE_PROVIDER_SITE_OTHER): Payer: BC Managed Care – PPO

## 2020-03-24 DIAGNOSIS — M7751 Other enthesopathy of right foot: Secondary | ICD-10-CM | POA: Diagnosis not present

## 2020-03-24 DIAGNOSIS — M2041 Other hammer toe(s) (acquired), right foot: Secondary | ICD-10-CM

## 2020-03-24 DIAGNOSIS — M21619 Bunion of unspecified foot: Secondary | ICD-10-CM | POA: Diagnosis not present

## 2020-03-24 NOTE — Progress Notes (Signed)
Subjective:   Patient ID: [name], female   DOB: 64 y.o.   MRN: [id number]   HPI Patient presents stating the second toe is really bothering me and I am having trouble wearing shoes and it did turn red it is no longer red but it is painful and I know I am getting need to have this right foot fixed.  Patient does not smoke likes to be active   Review of Systems  All other systems reviewed and are negative.       Objective:  Physical Exam Vitals and nursing note reviewed.  Constitutional:      Appearance: She is well-developed.  Pulmonary:     Effort: Pulmonary effort is normal.  Musculoskeletal:        General: Normal range of motion.  Skin:    General: Skin is warm.  Neurological:     Mental Status: She is alert.     Neurovascular status found to be intact muscle strength was adequate range of motion within normal limits.  Patient has severe forefoot derangement with rigid contracture digit to right mild deformity third digit right and bunion deformity right with deviation of the hallux against the second toe.  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  Discussed digital fusion possible distal arthroplasty digit 3 right distal osteotomy first metatarsal right and possible Akin osteotomy.  Patient will be seen back in December and is tentatively scheduled for surgery third week of December  X-rays indicate that there is significant  bunion deformity right foot with rigid contracture digit two right with arthritis of the inner phalangeal joint digit to right

## 2020-03-27 ENCOUNTER — Other Ambulatory Visit: Payer: Self-pay

## 2020-03-27 DIAGNOSIS — M810 Age-related osteoporosis without current pathological fracture: Secondary | ICD-10-CM

## 2020-03-27 MED ORDER — DENOSUMAB 60 MG/ML ~~LOC~~ SOSY: 60.0000 mg | PREFILLED_SYRINGE | SUBCUTANEOUS | 1 refills | Status: DC

## 2020-03-29 ENCOUNTER — Other Ambulatory Visit: Payer: Self-pay

## 2020-03-29 ENCOUNTER — Telehealth: Payer: Self-pay

## 2020-03-29 DIAGNOSIS — M810 Age-related osteoporosis without current pathological fracture: Secondary | ICD-10-CM

## 2020-03-29 MED ORDER — DENOSUMAB 60 MG/ML ~~LOC~~ SOSY: 60.0000 mg | PREFILLED_SYRINGE | SUBCUTANEOUS | 1 refills | Status: DC

## 2020-03-29 NOTE — Telephone Encounter (Signed)
Received fax from [facility] that they are unable to reach patient or locate active insurance coverage for the Prolia injection. Will route to Linus Galas, CMA to check on this for the patient.   Thank you!  Their fax number for any questions or concerns [phone] Or call- [phone]

## 2020-03-29 NOTE — Telephone Encounter (Signed)
I have spoken to the patient and she no longer uses [facility] she uses [facility]-this has been resolved

## 2020-03-30 NOTE — Telephone Encounter (Signed)
Has a PA from [facility] come in for this patient- she will need this to get her Prolia

## 2020-03-30 NOTE — Telephone Encounter (Signed)
Not that I have seen come through.   Sorry.

## 2020-04-05 ENCOUNTER — Telehealth: Payer: Self-pay

## 2020-04-05 ENCOUNTER — Telehealth: Payer: Self-pay | Admitting: Internal Medicine

## 2020-04-05 NOTE — Telephone Encounter (Signed)
[facility] pharmacy calling saying that prolia 60 mg/ml is not covered by insurance and wanted to see how Dr Cruzita Lederer wishes to proceed. They said they dont normally cover injectables that can be administered by provider but that because of covid they might be able to make an exception but would have to have form filled out. Pharmacy call back: [phone]

## 2020-04-05 NOTE — Telephone Encounter (Signed)
Please see below.

## 2020-04-05 NOTE — Telephone Encounter (Signed)
Received notification from [facility] that Prolia was approved. Will route message to Linus Galas, CMA to make her aware- and will fax over to her as well.

## 2020-04-05 NOTE — Telephone Encounter (Signed)
Patient called to speak with [name] regarding her Prolia shots. She is having some problems.  Her call back number is [phone]

## 2020-04-06 NOTE — Telephone Encounter (Signed)
[name], see below -I am not sure what form they are talking about.  Can you please help with getting her the Prolia?  I really do not have a preference if she gets it ordered here or picks it up from the pharmacy. I am attaching Almyra Free to this . C

## 2020-04-10 NOTE — Telephone Encounter (Signed)
I have gotten insurance verification back need to speak with the rep to see what next move is due to insurance change

## 2020-04-13 ENCOUNTER — Ambulatory Visit: Payer: BC Managed Care – PPO | Admitting: Family Medicine

## 2020-04-15 NOTE — Telephone Encounter (Signed)
I have run this through both her medical and pharmacy benefit and I think she will be best off picking it up from the pharmacy and having her husband (he is an MD) administer it to her at home-I am waiting for the pharmacy to call me back to confirm this

## 2020-04-15 NOTE — Telephone Encounter (Signed)
Pt will be coming to [facility] to get Prolia on 04/20/20

## 2020-04-20 ENCOUNTER — Other Ambulatory Visit: Payer: Self-pay

## 2020-04-20 ENCOUNTER — Ambulatory Visit (INDEPENDENT_AMBULATORY_CARE_PROVIDER_SITE_OTHER): Payer: BC Managed Care – PPO

## 2020-04-20 DIAGNOSIS — M810 Age-related osteoporosis without current pathological fracture: Secondary | ICD-10-CM | POA: Diagnosis not present

## 2020-04-20 MED ORDER — DENOSUMAB 60 MG/ML ~~LOC~~ SOSY
60.0000 mg | PREFILLED_SYRINGE | Freq: Once | SUBCUTANEOUS | Status: DC
  Administered 2020-04-20: 60 mg via SUBCUTANEOUS

## 2020-04-20 NOTE — Progress Notes (Signed)
[place] [address] [place] Phone: [phone] Subjective:   I [name] am serving as a Education administrator for Dr. Hulan Saas.  This visit occurred during the SARS-CoV-2 public health emergency.  Safety protocols were in place, including screening questions prior to the visit, additional usage of staff PPE, and extensive cleaning of exam room while observing appropriate contact time as indicated for disinfecting solutions.   I'm seeing this patient by the request  of:  Elby Showers, MD  CC: Multiple complaint follow-up  [id number]   11/14/2019 Known arthritic changes.  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  We discussed with patient about different medications including gabapentin and other neuromodulators.  Discussed the family of possible SNRIs as a possibility to help with pain and nerve pain.  Patient will consider this.  We discussed laboratory work-up though secondary to the amount of pain patient is having.  I do believe that it will be helpful.  Depending on laboratory work-up we discussed the possibility of advanced imaging but patient does not think she would consider the possibility of epidurals at this moment.  Only other thing would be to look for any type of cyst formation that would be noted in the cervical or thoracic spine but I think this would be highly unlikely.  Continue with check in his in 4 to 6 weeks.  Total time with patient today reviewing chart, images and discussing with patient 32 minutes  Update 04/21/20[name] is a 64 y.o. female coming in with complaint of neck pain. Patient states she is doing ok. States she is making progress.  Patient states that she has noticed still not significant amount of pain in the neck as much.  Has still noticed a little bit more in her wrist recently.  Has been playing with three 64-year-old granddaughter more as well as  traveling more and not doing the exercises more.  Patient is also going to be undergoing foot surgery in the near future. Does have the knee replacements and still has some discomfort.     Past Medical History:  Diagnosis Date  . Allergy   . Arthritis   . Complication of anesthesia   . H/O calcium pyrophosphate deposition disease (CPPD)   . Migraine   . PONV (postoperative nausea and vomiting)    Past Surgical History:  Procedure Laterality Date  . BUNIONECTOMY Left   . CERVICAL DISCECTOMY     C3-4  . EYE SURGERY Bilateral    LASIK  . JOINT REPLACEMENT Bilateral    knees  . KNEE ARTHROSCOPY Left   . KNEE ARTHROSCOPY Right 05/14/2013   Procedure: RIGHT ARTHROSCOPY KNEE;  Surgeon: Gearlean Alf, MD;  Location: [facility];  Service: Orthopedics;  Laterality: Right;   Social History   Socioeconomic History  . Marital status: Married    Spouse name: Not on file  . Number of children: Not on file  . Years of education: Not on file  . Highest education level: Not on file  Occupational History  . Not on file  Tobacco Use  . Smoking status: Never Smoker  . Smokeless tobacco: Never Used  Substance and Sexual Activity  . Alcohol use: Yes    Comment: socially  . Drug use: No  . Sexual activity: Not on file  Other Topics Concern  . Not on file  Social History Narrative  . Not on file  Social Determinants of Health   Financial Resource Strain:   . Difficulty of Paying Living Expenses: Not on file  Food Insecurity:   . Worried About Charity fundraiser in the Last Year: Not on file  . Ran Out of Food in the Last Year: Not on file  Transportation Needs:   . Lack of Transportation (Medical): Not on file  . Lack of Transportation (Non-Medical): Not on file  Physical Activity:   . Days of Exercise per Week: Not on file  . Minutes of Exercise per Session: Not on file  Stress:   . Feeling of Stress : Not on file  Social Connections:   . Frequency of Communication with  Friends and Family: Not on file  . Frequency of Social Gatherings with Friends and Family: Not on file  . Attends Religious Services: Not on file  . Active Member of Clubs or Organizations: Not on file  . Attends Archivist Meetings: Not on file  . Marital Status: Not on file   No Known Allergies Family History  Problem Relation Age of Onset  . Heart disease Mother   . Breast cancer Neg Hx     Current Outpatient Medications (Endocrine & Metabolic):  .  denosumab (PROLIA) 60 MG/ML SOSY injection, Inject 60 mg into the skin every 6 (six) months.    Current Outpatient Medications (Analgesics):  .  celecoxib (CELEBREX) 100 MG capsule, Take 1 capsule (100 mg total) by mouth 2 (two) times daily.   Current Outpatient Medications (Other):  Marland Kitchen  Cholecalciferol (VITAMIN D) 2000 UNITS CAPS, Take 2,000 Units by mouth daily. .  diclofenac Sodium (VOLTAREN) 1 % GEL, Apply topically. .  gabapentin (NEURONTIN) 100 MG capsule, Take 2 capsules (200 mg total) by mouth at bedtime. .  melatonin 5 MG TABS, Take by mouth. .  Multiple Vitamins-Minerals (CENTRUM ULTRA WOMENS) TABS, Take by mouth.   Reviewed prior external information including notes and imaging from  primary care provider As well as notes that were available from care everywhere and other healthcare systems.  Past medical history, social, surgical and family history all reviewed in electronic medical record.  No pertanent information unless stated regarding to the chief complaint.   Review of Systems:  No headache, visual changes, nausea, vomiting, diarrhea, constipation, dizziness, abdominal pain, skin rash, fevers, chills, night sweats, weight loss, swollen lymph nodes, body aches, joint swelling, chest pain, shortness of breath, mood changes. POSITIVE muscle aches  Objective  Blood pressure 136/90, pulse 80, height 5\' 4"  (1.626 m), weight 148 lb (67.1 kg), SpO2 98 %.   General: No apparent distress alert and oriented x3  mood and affect normal, dressed appropriately.  HEENT: Pupils equal, extraocular movements intact  Respiratory: Patient's speak in full sentences and does not appear short of breath  Cardiovascular: No lower extremity edema, non tender, no erythema  Neck exam does have some mild loss of lordosis.  Some tenderness to palpation minorly on the sides. Bilateral wrist exam showed the patient has some very mild tightness with dorsiflexion of the wrist.  Negative Finkelstein's.  Patient has some mild arthritic changes of the [facility] joint but negative's grind test. Right foot exam shows severe breakdown of the transverse arch.  Large bunion and bunionette formation noted.  Patient does have severe arthritic changes of the DIP of the second toe as well as the PIP of the second toe.  Patient has overlapping of the second toe on top of the first toe.  Impression and Recommendations:     The above documentation has been reviewed and is accurate and complete Lyndal Pulley, DO

## 2020-04-20 NOTE — Progress Notes (Signed)
[name] 64 y.o. female presents to office today for 6 month Prolia injection per Dr. [name], MD. Administered DENOSUMAB 60 mg/mL subcutaneous right arm. Patient tolerated well.

## 2020-04-21 ENCOUNTER — Ambulatory Visit (INDEPENDENT_AMBULATORY_CARE_PROVIDER_SITE_OTHER): Payer: BC Managed Care – PPO | Admitting: Family Medicine

## 2020-04-21 ENCOUNTER — Encounter: Payer: Self-pay | Admitting: Family Medicine

## 2020-04-21 VITALS — BP 136/90 | HR 80 | Ht 64.0 in | Wt 148.0 lb

## 2020-04-21 DIAGNOSIS — Z8739 Personal history of other diseases of the musculoskeletal system and connective tissue: Secondary | ICD-10-CM

## 2020-04-21 DIAGNOSIS — M5090 Cervical disc disorder, unspecified, unspecified cervical region: Secondary | ICD-10-CM

## 2020-04-21 DIAGNOSIS — M19071 Primary osteoarthritis, right ankle and foot: Secondary | ICD-10-CM | POA: Insufficient documentation

## 2020-04-21 NOTE — Assessment & Plan Note (Signed)
Patient has been doing relatively well overall.  Known arthritic changes of the neck.  Patient would like to continue with the gabapentin and we encouraged him to try 300 mg.  If patient does feel better on this we will increase to 300 mg capsules.  Patient will send a message in 7 to 10 days.  Continue the exercises and encouraged her to do them on a more regular basis.  Follow-up again 2 to 3 months

## 2020-04-21 NOTE — Patient Instructions (Signed)
Good to see you Spenco orthotics "total support" Oofos or Hoka recovery sandals Increase gabapentin to 3 pills at night  7-10 days send me a message we can send new prescription if it helps Try doubling tart cherry See me again in 2-3 months

## 2020-04-21 NOTE — Assessment & Plan Note (Signed)
Patient is following up with podiatry and likely having surgery in the near future.  We discussed over-the-counter orthotics as well as recovery sandals at this time.  Will likely need rocker-bottom shoes.  Has had surgical intervention on the left foot previously.

## 2020-05-05 ENCOUNTER — Other Ambulatory Visit: Payer: Self-pay

## 2020-05-05 ENCOUNTER — Ambulatory Visit (INDEPENDENT_AMBULATORY_CARE_PROVIDER_SITE_OTHER): Payer: BC Managed Care – PPO | Admitting: Podiatry

## 2020-05-05 DIAGNOSIS — M2041 Other hammer toe(s) (acquired), right foot: Secondary | ICD-10-CM

## 2020-05-05 DIAGNOSIS — M21611 Bunion of right foot: Secondary | ICD-10-CM

## 2020-05-05 DIAGNOSIS — M21619 Bunion of unspecified foot: Secondary | ICD-10-CM

## 2020-05-05 DIAGNOSIS — M7751 Other enthesopathy of right foot: Secondary | ICD-10-CM

## 2020-05-05 NOTE — Progress Notes (Signed)
Subjective:   Patient ID: [name], female   DOB: 64 y.o.   MRN: [id number]   HPI Patient presents concerning discomfort which is worsening in her right foot.  She is developed swelling of her second digital joint and also is developing more pain around her bunion joint and forefoot.  States it is worsened over the last 3 to 4 months    ROS      Objective:  Physical Exam  Neurovascular status intact with patient found to have structural bunion deformity right moderate limitation of motion first MPJ right severe elevation second digit right moderate third digit right with inflammation pain of the second metatarsal phalangeal joint right foot     Assessment:  Significant forefoot derangement right with structural bunion also elevated first metatarsal right along with significant arthritis of the inner phalangeal joint digit to right digital deformity 3 right and elongated second metatarsal right     Plan:  H&P condition reviewed at great length.  I do think osteotomy right first metatarsal with possibility for plantarflexed recomponent and possible Akin osteotomy right along with digital fusion digits 2 3 with removal of the arthritis around the inner phalangeal joint digit to right along with probable shortening osteotomy of the second metatarsal right foot.  Explained all this to patient and gave her consent form reviewing alternative treatments complications and after review she signed.  Patient understands total recovery can take 6 months to 1 year understands no guarantee as far success of procedure and after extensive review signed consent form and is scheduled for outpatient surgery.  Patient had air fracture walker dispensed today that I want her to get used to prior to the procedure and I educated on usage

## 2020-05-10 ENCOUNTER — Telehealth: Payer: Self-pay | Admitting: Podiatry

## 2020-05-10 NOTE — Telephone Encounter (Signed)
DOS: 06/01/2020  Procedures: Liane Comber Bunionectomy Rt [phone]), Poss. Aiken Osteotomy Rt [phone]), Poss. Metatarsal Osteotomy 2nd Rt (59923), & Hammertoe Repair w/ Pin 2nd & 3rd Rt ([id number])  BCBS Effective From: 06/05/2018 - 06/04/9998  Deductible: $2,000 with $2,000 met and $0 remaining. Out of Pocket: $3,000 with $2,951 met and $49 remaining. CoInsurance: 10% Copay: $0  Per Jenny Reichmann B no Prior Authorization is required. Call Reference Number [id number]

## 2020-05-25 ENCOUNTER — Encounter: Payer: Self-pay | Admitting: Family Medicine

## 2020-05-25 MED ORDER — GABAPENTIN 100 MG PO CAPS
200.0000 mg | ORAL_CAPSULE | Freq: Two times a day (BID) | ORAL | 3 refills | Status: DC
Start: 2020-05-25 — End: 2021-06-20

## 2020-05-31 MED ORDER — OXYCODONE-ACETAMINOPHEN 10-325 MG PO TABS
1.0000 | ORAL_TABLET | Freq: Four times a day (QID) | ORAL | 0 refills | Status: AC | PRN
Start: 2020-05-31 — End: 2020-06-08

## 2020-05-31 NOTE — Addendum Note (Signed)
Addended by: Nicholes Rough on: 05/31/2020 02:55 PM   Modules accepted: Orders

## 2020-06-01 DIAGNOSIS — M2011 Hallux valgus (acquired), right foot: Secondary | ICD-10-CM

## 2020-06-01 DIAGNOSIS — M2041 Other hammer toe(s) (acquired), right foot: Secondary | ICD-10-CM | POA: Diagnosis not present

## 2020-06-01 DIAGNOSIS — M21541 Acquired clubfoot, right foot: Secondary | ICD-10-CM | POA: Diagnosis not present

## 2020-06-01 DIAGNOSIS — Q6689 Other  specified congenital deformities of feet: Secondary | ICD-10-CM | POA: Diagnosis not present

## 2020-06-07 ENCOUNTER — Ambulatory Visit (INDEPENDENT_AMBULATORY_CARE_PROVIDER_SITE_OTHER): Payer: BC Managed Care – PPO

## 2020-06-07 ENCOUNTER — Encounter: Payer: Self-pay | Admitting: Podiatry

## 2020-06-07 ENCOUNTER — Other Ambulatory Visit: Payer: Self-pay

## 2020-06-07 ENCOUNTER — Ambulatory Visit (INDEPENDENT_AMBULATORY_CARE_PROVIDER_SITE_OTHER): Payer: BC Managed Care – PPO | Admitting: Podiatry

## 2020-06-07 DIAGNOSIS — M2041 Other hammer toe(s) (acquired), right foot: Secondary | ICD-10-CM | POA: Diagnosis not present

## 2020-06-08 NOTE — Progress Notes (Signed)
Subjective:   Patient ID: [name], female   DOB: 65 y.o.   MRN: [id number]   HPI Patient states she is doing very well with surgery with minimal discomfort and so far is very pleased   ROS      Objective:  Physical Exam  Neurovascular status intact negative Denna Haggard' sign was noted with patient's forefoot right healing well wound edges well coapted digits in good alignment pins in place and wound edges well coapted     Assessment:  Doing well post forefoot reconstruction right with good alignment     Plan:  H&P x-rays reviewed sterile dressing reapplied and instructed on continued elevation compression immobilization  X-rays indicate osteotomies healing well fixation in good position

## 2020-06-14 ENCOUNTER — Encounter: Payer: Self-pay | Admitting: Podiatry

## 2020-06-21 ENCOUNTER — Encounter: Payer: BC Managed Care – PPO | Admitting: Podiatry

## 2020-06-23 ENCOUNTER — Encounter: Payer: Self-pay | Admitting: Podiatry

## 2020-06-23 ENCOUNTER — Ambulatory Visit (INDEPENDENT_AMBULATORY_CARE_PROVIDER_SITE_OTHER): Payer: BC Managed Care – PPO | Admitting: Podiatry

## 2020-06-23 ENCOUNTER — Ambulatory Visit (INDEPENDENT_AMBULATORY_CARE_PROVIDER_SITE_OTHER): Payer: BC Managed Care – PPO

## 2020-06-23 ENCOUNTER — Other Ambulatory Visit: Payer: Self-pay

## 2020-06-23 DIAGNOSIS — M2041 Other hammer toe(s) (acquired), right foot: Secondary | ICD-10-CM

## 2020-06-24 NOTE — Progress Notes (Signed)
Subjective:   Patient ID: [name], female   DOB: 65 y.o.   MRN: [id number]   HPI Patient presents stating that she is very pleased so far with her foot and that she is doing very well   ROS      Objective:  Physical Exam  Neurovascular status intact negative Bevelyn Buckles' sign noted with patient's pins intact second and third toes wound edges well coapted good alignment with good reduction of the structural first MPJ deformity     Assessment:  Doing well overall with encouragement of range of motion first MPJ     Plan:  H&P x-rays reviewed and at this point I have recommended the importance of range of motion exercises first MPJ and stitches removed wound edges coapted well and continue to lower the second and third toes and elevation with pin removal in 2 weeks.  Encouraged her to call questions concerns  X-rays indicate good alignment pins in good alignment first metatarsal healing well side visit

## 2020-06-29 DIAGNOSIS — Z01419 Encounter for gynecological examination (general) (routine) without abnormal findings: Secondary | ICD-10-CM | POA: Diagnosis not present

## 2020-06-29 DIAGNOSIS — Z6826 Body mass index (BMI) 26.0-26.9, adult: Secondary | ICD-10-CM | POA: Diagnosis not present

## 2020-07-07 ENCOUNTER — Other Ambulatory Visit: Payer: Self-pay

## 2020-07-07 ENCOUNTER — Ambulatory Visit (INDEPENDENT_AMBULATORY_CARE_PROVIDER_SITE_OTHER): Payer: BC Managed Care – PPO

## 2020-07-07 ENCOUNTER — Ambulatory Visit (INDEPENDENT_AMBULATORY_CARE_PROVIDER_SITE_OTHER): Payer: BC Managed Care – PPO | Admitting: Podiatry

## 2020-07-07 DIAGNOSIS — M21611 Bunion of right foot: Secondary | ICD-10-CM

## 2020-07-07 DIAGNOSIS — M21619 Bunion of unspecified foot: Secondary | ICD-10-CM | POA: Diagnosis not present

## 2020-07-07 NOTE — Progress Notes (Signed)
Subjective:   Patient ID: [name], female   DOB: 65 y.o.   MRN: [id number]   HPI Patient states doing well with surgery ready to get pins out and would like to return to shoe gear   ROS      Objective:  Physical Exam  Neurovascular status intact negative Bevelyn Buckles' sign noted with patient's right foot healing well wound edges well coapted pins in place digits in good alignment with elevated first metatarsal segment with mild restriction motion more functional     Assessment:  Doing well overall foot surgery right with good recovery     Plan:  H&P reviewed condition and recommended pin removal which was done today with sterile dressings applied discussed continued compression dispensed ankle compression stocking discussed orthotics that we will make at next visit when she returns.  Reappoint 4 weeks or earlier if needed after she returns from [place] which I gave instructions for usage of baby aspirin  X-rays indicate that there is good healing of the osteotomy site with fixation in place

## 2020-07-14 ENCOUNTER — Ambulatory Visit: Payer: BC Managed Care – PPO | Admitting: Family Medicine

## 2020-07-20 ENCOUNTER — Ambulatory Visit: Payer: BC Managed Care – PPO | Admitting: Family Medicine

## 2020-07-28 ENCOUNTER — Ambulatory Visit: Payer: BC Managed Care – PPO | Admitting: Family Medicine

## 2020-08-06 NOTE — Progress Notes (Signed)
[place] [address] [place] Phone: [phone] Subjective:   I Kandace Blitz am serving as a Education administrator for Dr. Hulan Saas.  This visit occurred during the SARS-CoV-2 public health emergency.  Safety protocols were in place, including screening questions prior to the visit, additional usage of staff PPE, and extensive cleaning of exam room while observing appropriate contact time as indicated for disinfecting solutions.   I'm seeing this patient by the request  of:  Baxley, Cresenciano Lick, MD  CC: right ankle and neck pain   [id number]   04/21/2020 Patient is following up with podiatry and likely having surgery in the near future.  We discussed over-the-counter orthotics as well as recovery sandals at this time.  Will likely need rocker-bottom shoes.  Has had surgical intervention on the left foot previously.  Patient has been doing relatively well overall.  Known arthritic changes of the neck.  Patient would like to continue with the gabapentin and we encouraged him to try 300 mg.  If patient does feel better on this we will increase to 300 mg capsules.  Patient will send a message in 7 to 10 days.  Continue the exercises and encouraged her to do them on a more regular basis.  Follow-up again 2 to 3 months  Update  CIONNA COLLANTES is a 65 y.o. female coming in with complaint neck pain. Has had foot surgery from Dr. Paulla Dolly since last visit. Patient states she is doing well. Still has pain between the shoulder blades that come and go. States that sometimes it gets really bad and other times it eases off and she believes she can live with it. Pain sometimes radiates down the right arm.  Last visit increased 300mg  at night.  Patient states that unfortunately it does seem to come and go.  Some days like today she is feeling significantly better and then some days can be significantly worse.  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      Past Medical History:  Diagnosis Date  . Allergy   . Arthritis   . Complication of anesthesia   . H/O calcium pyrophosphate deposition disease (CPPD)   . Migraine   . PONV (postoperative nausea and vomiting)    Past Surgical History:  Procedure Laterality Date  . BUNIONECTOMY Left   . CERVICAL DISCECTOMY     C3-4  . EYE SURGERY Bilateral    LASIK  . JOINT REPLACEMENT Bilateral    knees  . KNEE ARTHROSCOPY Left   . KNEE ARTHROSCOPY Right 05/14/2013   Procedure: RIGHT ARTHROSCOPY KNEE;  Surgeon: Gearlean Alf, MD;  Location: [facility];  Service: Orthopedics;  Laterality: Right;   Social History   Socioeconomic History  . Marital status: Married    Spouse name: Not on file  . Number of children: Not on file  . Years of education: Not on file  . Highest education level: Not on file  Occupational History  . Not on file  Tobacco Use  . Smoking status: Never Smoker  . Smokeless tobacco: Never Used  Substance and Sexual Activity  . Alcohol use: Yes    Comment: socially  . Drug use: No  . Sexual activity: Not on file  Other Topics Concern  . Not on file  Social History Narrative  . Not on file   Social Determinants  of Health   Financial Resource Strain: Not on file  Food Insecurity: Not on file  Transportation Needs: Not on file  Physical Activity: Not on file  Stress: Not on file  Social Connections: Not on file   No Known Allergies Family History  Problem Relation Age of Onset  . Heart disease Mother   . Breast cancer Neg Hx     Current Outpatient Medications (Endocrine & Metabolic):  .  denosumab (PROLIA) 60 MG/ML SOSY injection, Inject 60 mg into the skin every 6 (six) months.    Current Outpatient Medications (Analgesics):  .  celecoxib (CELEBREX) 100 MG capsule, Take 1 capsule (100 mg total) by mouth 2 (two) times daily.   Current Outpatient Medications (Other):  Marland Kitchen   Cholecalciferol (VITAMIN D) 2000 UNITS CAPS, Take 2,000 Units by mouth daily. .  diclofenac Sodium (VOLTAREN) 1 % GEL, Apply topically. .  gabapentin (NEURONTIN) 100 MG capsule, Take 2 capsules (200 mg total) by mouth 2 (two) times daily. .  melatonin 5 MG TABS, Take by mouth. .  Multiple Vitamins-Minerals (CENTRUM ULTRA WOMENS) TABS, Take by mouth.   Reviewed prior external information including notes and imaging from  primary care provider As well as notes that were available from care everywhere and other healthcare systems.  Past medical history, social, surgical and family history all reviewed in electronic medical record.  No pertanent information unless stated regarding to the chief complaint.   Review of Systems:  No headache, visual changes, nausea, vomiting, diarrhea, constipation, dizziness, abdominal pain, skin rash, fevers, chills, night sweats, weight loss, swollen lymph nodes, body aches, joint swelling, chest pain, shortness of breath, mood changes. POSITIVE muscle aches  Objective  Blood pressure 130/90, pulse 67, height 5\' 4"  (1.626 m), weight 150 lb (68 kg), SpO2 98 %.   General: No apparent distress alert and oriented x3 mood and affect normal, dressed appropriately.  HEENT: Pupils equal, extraocular movements intact  Respiratory: Patient's speak in full sentences and does not appear short of breath  Cardiovascular: No lower extremity edema, non tender, no erythema  Gait normal with good balance and coordination.  MSK: Neck exam does have some mild loss of lordosis.  Some limited extension of the neck.  Patient does have trigger nodules noted in the right trapezius and rhomboid.  Patient does have mild tightness with side bending of the neck.  Scapula very mild dyskinesis noted.    Impression and Recommendations:     The above documentation has been reviewed and is accurate and complete Lyndal Pulley, DO

## 2020-08-09 ENCOUNTER — Encounter: Payer: Self-pay | Admitting: Family Medicine

## 2020-08-09 ENCOUNTER — Other Ambulatory Visit: Payer: Self-pay

## 2020-08-09 ENCOUNTER — Ambulatory Visit (INDEPENDENT_AMBULATORY_CARE_PROVIDER_SITE_OTHER): Payer: BC Managed Care – PPO | Admitting: Family Medicine

## 2020-08-09 VITALS — BP 130/90 | HR 67 | Ht 64.0 in | Wt 150.0 lb

## 2020-08-09 DIAGNOSIS — M5090 Cervical disc disorder, unspecified, unspecified cervical region: Secondary | ICD-10-CM | POA: Diagnosis not present

## 2020-08-09 DIAGNOSIS — M542 Cervicalgia: Secondary | ICD-10-CM | POA: Diagnosis not present

## 2020-08-09 NOTE — Patient Instructions (Addendum)
Good to see you Keep doing what you are doing Will get physical therapy to help you  Keep working on posture  See me again in  6-8 weeks and if not better will discuss trigger point injections, or manipulation or MRI of the neck

## 2020-08-09 NOTE — Assessment & Plan Note (Addendum)
Patient does have significant cervical disc disease of the neck that could be contributing to some more of the back pain of the upper back.  Discussed with patient in great length and encouraged her to start with formal physical therapy for his chronic problem with worsening exacerbation.  Patient is taking 300 mg of gabapentin at night which is helping.  Patient is also on Celebrex.  Pain still seems to come and go.  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  Follow-up with me again 2 months worsening pain will need MRI.

## 2020-08-10 ENCOUNTER — Other Ambulatory Visit: Payer: Self-pay | Admitting: Obstetrics & Gynecology

## 2020-08-10 DIAGNOSIS — Z1231 Encounter for screening mammogram for malignant neoplasm of breast: Secondary | ICD-10-CM

## 2020-08-11 ENCOUNTER — Ambulatory Visit (INDEPENDENT_AMBULATORY_CARE_PROVIDER_SITE_OTHER): Payer: BC Managed Care – PPO

## 2020-08-11 ENCOUNTER — Other Ambulatory Visit: Payer: Self-pay

## 2020-08-11 ENCOUNTER — Ambulatory Visit (INDEPENDENT_AMBULATORY_CARE_PROVIDER_SITE_OTHER): Payer: BC Managed Care – PPO | Admitting: Podiatry

## 2020-08-11 ENCOUNTER — Encounter: Payer: Self-pay | Admitting: Podiatry

## 2020-08-11 DIAGNOSIS — M2041 Other hammer toe(s) (acquired), right foot: Secondary | ICD-10-CM | POA: Diagnosis not present

## 2020-08-11 DIAGNOSIS — M21619 Bunion of unspecified foot: Secondary | ICD-10-CM

## 2020-08-11 NOTE — Progress Notes (Signed)
Subjective:   Patient ID: [name], female   DOB: 65 y.o.   MRN: [id number]   HPI Patient states doing very well with surgery and states she knows probably long-term she will need orthotics   ROS      Objective:  Physical Exam  Neurovascular status intact with patient's right foot healing very well wound edges well coapted hallux in rectus position good range of motion digits in good alignment     Assessment:  Doing well post foot surgery right     Plan:  H&P x-ray reviewed discussed the elevation of the first metatarsal segment which is part of her normal mechanics and I recommended long-term orthotics and at this point she could return to normal activities.  Reappoint 8 weeks  X-rays indicate that there is elevation of the first metatarsal segment right with functional hallux limitus but good structural correction good alignment of the digits

## 2020-08-12 ENCOUNTER — Encounter: Payer: Self-pay | Admitting: Internal Medicine

## 2020-08-12 ENCOUNTER — Ambulatory Visit (INDEPENDENT_AMBULATORY_CARE_PROVIDER_SITE_OTHER): Payer: BC Managed Care – PPO | Admitting: Internal Medicine

## 2020-08-12 VITALS — BP 120/80 | HR 78 | Ht 64.0 in | Wt 151.4 lb

## 2020-08-12 DIAGNOSIS — Z8639 Personal history of other endocrine, nutritional and metabolic disease: Secondary | ICD-10-CM

## 2020-08-12 DIAGNOSIS — M810 Age-related osteoporosis without current pathological fracture: Secondary | ICD-10-CM

## 2020-08-12 LAB — BASIC METABOLIC PANEL
BUN: 14 mg/dL (ref 6–23)
CO2: 30 mEq/L (ref 19–32)
Calcium: 9.8 mg/dL (ref 8.4–10.5)
Chloride: 104 mEq/L (ref 96–112)
Creatinine, Ser: 0.78 mg/dL (ref 0.40–1.20)
GFR: 81.01 mL/min (ref >60.00)
Glucose, Bld: 95 mg/dL (ref 70–99)
Potassium: 4.2 mEq/L (ref 3.5–5.1)
Sodium: 141 mEq/L (ref 135–145)

## 2020-08-12 NOTE — Patient Instructions (Signed)
Please stop at the lab.  Continue vitamin D 3000 units.  Please have a new DXA scan in 05/2021.  Please come back for a follow-up appointment in 1 year.

## 2020-08-12 NOTE — Progress Notes (Signed)
Patient ID: [name], female   DOB: [birth year], 65 y.o.   MRN: [id number]   This visit occurred during the SARS-CoV-2 public health emergency.  Safety protocols were in place, including screening questions prior to the visit, additional usage of staff PPE, and extensive cleaning of exam room while observing appropriate contact time as indicated for disinfecting solutions.   HPI  [name] is a 65 y.o.-year-old female, returning for follow-up for osteoporosis.  Last visit 1 year ago.  She had toe surgery  bunionectomy since last OV, in 05/2020.  She did not exercise as much as before because of this.  She was diagnosed with osteoporosis in 2019.  Reviewed her previous DXA scan results: Date L1-L4 T score FN T score 33% distal Radius  05/09/2019 ([facility]) -2.2 RFN: -1.2 LFN: -0.7 n/a  03/26/2017 ([facility]) -2.8 RFN: -1.1 LFN: -0.8 n/a  06/25/2012 ([facility]) -1.8 LFN: -0.8 n/a   No fractures or falls.  No dizziness/vertigo/orthostasis/poor vision  Osteoporosis treatment history: - Fosamax x4 weeks >> abdominal pain - Prolia injections- 03/06/2018, 09/17/2018, 03/20/2019, 10/15/2019, 04/20/2020  She has a history of vitamin D deficiency but latest levels have been normal: Lab Results  Component Value Date   VD25OH 48.13 08/08/2019   VD25OH 41 03/18/2019   VD25OH 56.60 08/08/2018   VD25OH 54.19 08/07/2017   VD25OH 40 03/06/2017   VD25OH 38 12/16/2015   VD25OH 36 03/25/2012   VD25OH 25 (L) 01/20/2011   She is on 3000 units vitamin D daily.  She continues weightbearing exercises: prev. 30 minutes 3 times a week with a personal trainer >> now walking, but planning to restart working out with the Physiological scientist.  She is not taking high vitamin A doses.  She has a history of CPPD >> now off Plaquenil; on Gabapentin now (really helps) + a lower dose of Celebrex.  She had many steroid injections over the years.  Menopause was in late 7s.  Pt does have a FH of osteoporosis in  mother (many fractures).  She has a distant history of kidney stones x1 and no history of hyper or hypocalcemia or hyperparathyroidism:  Lab Results  Component Value Date   PTH 14 11/14/2019   CALCIUM 9.6 11/14/2019   CALCIUM 9.2 08/08/2019   CALCIUM 9.4 03/18/2019   CALCIUM 9.6 08/08/2018   CALCIUM 10.2 08/07/2017   CALCIUM 9.3 03/06/2017   CALCIUM 9.1 12/16/2015   CALCIUM 9.4 07/20/2013   CALCIUM 9.8 02/04/2013   CALCIUM 9.2 03/25/2012   No thyrotoxicosis: Lab Results  Component Value Date   TSH 1.65 03/18/2019   TSH 0.97 03/06/2017   TSH 1.51 12/16/2015   TSH 2.172 03/25/2012   TSH 1.413 01/20/2011   No CKD. Last BUN/Cr: Lab Results  Component Value Date   BUN 15 08/08/2019   CREATININE 0.83 08/08/2019   She has a h/o cervical spine fusion sx 2005.  ROS: Constitutional: no weight gain/no weight loss, no fatigue, no subjective hyperthermia, no subjective hypothermia Eyes: no blurry vision, no xerophthalmia ENT: no sore throat, no nodules palpated in neck, no dysphagia, no odynophagia, no hoarseness Cardiovascular: no CP/no SOB/no palpitations/no leg swelling Respiratory: no cough/no SOB/no wheezing Gastrointestinal: no N/no V/no D/no C/no acid reflux Musculoskeletal: no muscle aches/+ joint aches (back) Skin: no rashes, no hair loss Neurological: no tremors/no numbness/no tingling/no dizziness  I reviewed pt's medications, allergies, PMH, social hx, family hx, and changes were documented in the history of present illness. Otherwise, unchanged from my initial visit  note.  Past Medical History:  Diagnosis Date  . Allergy   . Arthritis   . Complication of anesthesia   . H/O calcium pyrophosphate deposition disease (CPPD)   . Migraine   . PONV (postoperative nausea and vomiting)    Past Surgical History:  Procedure Laterality Date  . BUNIONECTOMY Left   . CERVICAL DISCECTOMY     C3-4  . EYE SURGERY Bilateral    LASIK  . JOINT REPLACEMENT Bilateral     knees  . KNEE ARTHROSCOPY Left   . KNEE ARTHROSCOPY Right 05/14/2013   Procedure: RIGHT ARTHROSCOPY KNEE;  Surgeon: Gearlean Alf, MD;  Location: [facility];  Service: Orthopedics;  Laterality: Right;   Social History   Socioeconomic History  . Marital status: Married    Spouse name: Not on file  . Number of children: Not on file  . Years of education: Not on file  . Highest education level: Not on file  Occupational History  . Not on file  Tobacco Use  . Smoking status: Never Smoker  . Smokeless tobacco: Never Used  Substance and Sexual Activity  . Alcohol use: Yes    Comment: socially  . Drug use: No  . Sexual activity: Not on file  Other Topics Concern  . Not on file  Social History Narrative  . Not on file   Social Determinants of Health   Financial Resource Strain: Not on file  Food Insecurity: Not on file  Transportation Needs: Not on file  Physical Activity: Not on file  Stress: Not on file  Social Connections: Not on file  Intimate Partner Violence: Not on file   Current Outpatient Medications on File Prior to Visit  Medication Sig Dispense Refill  . celecoxib (CELEBREX) 100 MG capsule Take 1 capsule (100 mg total) by mouth 2 (two) times daily. 180 capsule 3  . Cholecalciferol (VITAMIN D) 2000 UNITS CAPS Take 2,000 Units by mouth daily.    Marland Kitchen denosumab (PROLIA) 60 MG/ML SOSY injection Inject 60 mg into the skin every 6 (six) months. 1 mL 1  . diclofenac Sodium (VOLTAREN) 1 % GEL Apply topically.    . gabapentin (NEURONTIN) 100 MG capsule Take 2 capsules (200 mg total) by mouth 2 (two) times daily. 120 capsule 3  . melatonin 5 MG TABS Take by mouth.    . Multiple Vitamins-Minerals (CENTRUM ULTRA WOMENS) TABS Take by mouth.     No current facility-administered medications on file prior to visit.   No Known Allergies Family History  Problem Relation Age of Onset  . Heart disease Mother   . Breast cancer Neg Hx     PE: BP 120/80 (BP Location: Right Arm,  Patient Position: Sitting, Cuff Size: Normal)   Pulse 78   Ht 5\' 4"  (1.626 m)   Wt 151 lb 6.4 oz (68.7 kg)   SpO2 97%   BMI 25.99 kg/m  Wt Readings from Last 3 Encounters:  08/12/20 151 lb 6.4 oz (68.7 kg)  08/09/20 150 lb (68 kg)  04/21/20 148 lb (67.1 kg)   Constitutional: normal weight, in NAD Eyes: PERRLA, EOMI, no exophthalmos ENT: moist mucous membranes, no thyromegaly, no cervical lymphadenopathy Cardiovascular: RRR, No MRG Respiratory: CTA B Gastrointestinal: abdomen soft, NT, ND, BS+ Musculoskeletal: no deformities, strength intact in all 4 Skin: moist, warm, no rashes Neurological: no tremor with outstretched hands, DTR normal in all 4  Assessment: 1. Osteoporosis  2.  History of vitamin D deficiency  Plan: 1. Osteoporosis  -Likely  age-related/postmenopausal, but also possibly contributed to by her steroid injections.  She has family history of osteoporosis, also. -We reviewed together her latest bone density scan from 05/09/2019.  This showed improvement in the spine BMD (T score improved from -2.8 to -2.2), and stability in the femoral neck scores.  This is a good result.  At that time, I advised her to continue Prolia. -She was initially reticent to start Prolia due to her history of pseudogout and she tried Fosamax for 4 weeks, however, this gave her abdominal pain so we ended up starting Prolia in 03/2018 -She had 3 injections before last visit and 2 since then -She tolerates it well, without hip/thigh/jaw pain -She is maintaining a good amount of calcium and protein in her diet.  At last visit, she was exercising with a personal trainer and did weightbearing exercises.  She had foot surgery in 05/2020 and she has not been very active  afterwards.  She restarted walking and plans to restart working out with a Physiological scientist.  We discussed about the [facility] center for skeletal loading in the past but did not try this. -Since last visit, she had an ionized calcium  checked in 11/2019 and this was normal, at 5.12.  At that time, PTH was also normal. -At today's visit, we will recheck a BMP and a vitamin D level -She is due for another bone density scan in 05/2021 - advised her to ask her OB/GYN doctor to order this, since she gets these at [facility].  We discussed about why it is important to obtain a new bone density on the same DXA machine. -I we will see her back in a year  2.  History of vitamin D deficiency -Latest vitamin D level was reviewed from last visit and this was normal -She continues on 3000 units total vitamin D per day -We will recheck her vitamin D level now  Component     Latest Ref Rng & Units 08/12/2020  Sodium     135 - 145 mEq/L 141  Potassium     3.5 - 5.1 mEq/L 4.2  Chloride     96 - 112 mEq/L 104  CO2     19 - 32 mEq/L 30  Glucose     70 - 99 mg/dL 95  BUN     6 - 23 mg/dL 14  Creatinine     0.40 - 1.20 mg/dL 0.78  GFR     >60.00 mL/min 81.01  Calcium     8.4 - 10.5 mg/dL 9.8  Vitamin D, 25-Hydroxy     30.0 - 100.0 ng/mL 51.0  Labs are excellent.  [name], MD PhD [facility] Endocrinology

## 2020-08-13 ENCOUNTER — Encounter: Payer: Self-pay | Admitting: Internal Medicine

## 2020-08-13 LAB — VITAMIN D 25 HYDROXY (VIT D DEFICIENCY, FRACTURES): Vit D, 25-Hydroxy: 51 ng/mL (ref 30.0–100.0)

## 2020-08-20 ENCOUNTER — Encounter: Payer: Self-pay | Admitting: Family Medicine

## 2020-08-20 ENCOUNTER — Other Ambulatory Visit: Payer: Self-pay

## 2020-08-20 MED ORDER — GABAPENTIN 300 MG PO CAPS: 300.0000 mg | ORAL_CAPSULE | Freq: Every day | ORAL | 0 refills | Status: DC

## 2020-08-20 NOTE — Telephone Encounter (Signed)
Spoke with patient. She does feel that the 300mg  of gabapentin is helping to alleviate her symptoms. Would like to continue. Per a verbal from Dr. Tamala Julian, prescription for 300mg  of gabapentin called into [facility].

## 2020-09-08 DIAGNOSIS — M546 Pain in thoracic spine: Secondary | ICD-10-CM | POA: Diagnosis not present

## 2020-09-08 DIAGNOSIS — M542 Cervicalgia: Secondary | ICD-10-CM | POA: Diagnosis not present

## 2020-09-13 DIAGNOSIS — M542 Cervicalgia: Secondary | ICD-10-CM | POA: Diagnosis not present

## 2020-09-13 DIAGNOSIS — M546 Pain in thoracic spine: Secondary | ICD-10-CM | POA: Diagnosis not present

## 2020-09-20 DIAGNOSIS — M546 Pain in thoracic spine: Secondary | ICD-10-CM | POA: Diagnosis not present

## 2020-09-20 DIAGNOSIS — M542 Cervicalgia: Secondary | ICD-10-CM | POA: Diagnosis not present

## 2020-09-28 ENCOUNTER — Telehealth: Payer: Self-pay

## 2020-09-28 NOTE — Telephone Encounter (Signed)
Prolia VOB initiated via [email or web address]  Last OV 08/12/20 Next OV Last Prolia inj 04/20/20 Next Prolia inj due 10/19/20

## 2020-10-01 ENCOUNTER — Ambulatory Visit
Admission: RE | Admit: 2020-10-01 | Discharge: 2020-10-01 | Disposition: A | Discharge status: Home / Self Care | Payer: BC Managed Care – PPO | Source: Ambulatory Visit | Attending: Obstetrics & Gynecology | Admitting: Obstetrics & Gynecology

## 2020-10-01 ENCOUNTER — Other Ambulatory Visit: Payer: Self-pay

## 2020-10-01 DIAGNOSIS — Z1231 Encounter for screening mammogram for malignant neoplasm of breast: Secondary | ICD-10-CM

## 2020-10-05 ENCOUNTER — Ambulatory Visit: Payer: BC Managed Care – PPO | Admitting: Family Medicine

## 2020-10-05 NOTE — Telephone Encounter (Signed)
MEDICAL BENEFIT SUMMARY Patient Out-of-Pocket Responsibility Coverage Available Authorization Required Deductible Co-pay/Coinsurance Prolia OOP COST PHYSICIAN FACILITY FEE ADMIN FEE PURCHASE OR REFERRAL: YES YES PA Required PRIMARY No SECONDARY No 10%* No* 10%* SPECIALTY PHARMACY (via Medical Benefit): NO YES No* No* No* *Reflects patient costs once plan deductible is met. Please see Medical Benefit Details for further information regarding patient costs. Patient costs may vary based on services rendered. BENEFITS VERIFIED FOR THE FOLLOWING DIAGNOSIS AND INSURANCE PLANS Verified for Diagnosis M81.0 Site of Care  MD Office   Policy Level: Primary Policy Status: Active Payer Name: [place] Plan Name: PPO Plan Policy Number: [id number] AB Employer Name: [employer] Plan Type: PPO Group Number: [id number]  Payer Phone: [phone] PRIMARY MEDICAL BENEFIT DETAILS (PHYSICIAN PURCHASE, OR REFERRAL TO TREATING SITE) COVERAGE AVAILABLE: Yes COVERAGE DETAILS: The benefits provided on this Verification of Benefits form are Medical Benefits and are the patient's In-Network benefits for Prolia. If you would like Pharmacy Benefits for Prolia, please call [phone]. For [employer] where the site is a [employer] of Service, [place] is not an option, unless the site is part of the [employer]. Prolia must be obtained through the Specialty Pharmacy. Provider may call [phone] for questions on obtaining Prolia through the Specialty Pharmacy. AUTHORIZATION REQUIRED: Yes PA PROCESS DETAILS: PA is required. Complete the PA form & fax with clinical notes to Medical Management at [phone]. [email or web address]

## 2020-10-06 ENCOUNTER — Encounter: Payer: Self-pay | Admitting: Podiatry

## 2020-10-06 ENCOUNTER — Ambulatory Visit (INDEPENDENT_AMBULATORY_CARE_PROVIDER_SITE_OTHER): Payer: BC Managed Care – PPO

## 2020-10-06 ENCOUNTER — Other Ambulatory Visit: Payer: Self-pay

## 2020-10-06 ENCOUNTER — Ambulatory Visit: Payer: BC Managed Care – PPO

## 2020-10-06 ENCOUNTER — Ambulatory Visit (INDEPENDENT_AMBULATORY_CARE_PROVIDER_SITE_OTHER): Payer: BC Managed Care – PPO | Admitting: Podiatry

## 2020-10-06 DIAGNOSIS — M2041 Other hammer toe(s) (acquired), right foot: Secondary | ICD-10-CM

## 2020-10-06 DIAGNOSIS — M779 Enthesopathy, unspecified: Secondary | ICD-10-CM

## 2020-10-06 NOTE — Progress Notes (Signed)
Subjective:   Patient ID: [name], female   DOB: 65 y.o.   MRN: [id number]   HPI Patient presents stating that the feet can hurt in general her fifth digits can bother her and her joints in general at the end of the day.  Overall has done well with surgery pleased   ROS      Objective:  Physical Exam  Neurovascular status intact negative Bevelyn Buckles' sign noted patient's right foot healing well good range of motion irritation around the fifth digit mild discomfort around the MPJs     Assessment:  Inflammatory condition with good structural correction right chronic tendinitis symptomatology     Plan:  H&P reviewed x-rays and at this point continue cushioning with pads and we casted for functional orthotics to reduce plantar pressure on the feet.  Reappoint when returned and continue with conservative care  X-rays indicate good structural alignment with toes in his good position as possible given severe preoperative deformity

## 2020-10-09 ENCOUNTER — Encounter: Payer: Self-pay | Admitting: Internal Medicine

## 2020-10-15 NOTE — Telephone Encounter (Signed)
PA form printed from [email or web address]  Form will be faxed via fax machine once completed.

## 2020-10-18 NOTE — Telephone Encounter (Signed)
PA form complete and ready to fax.

## 2020-10-26 ENCOUNTER — Ambulatory Visit (INDEPENDENT_AMBULATORY_CARE_PROVIDER_SITE_OTHER): Payer: BC Managed Care – PPO | Admitting: Podiatry

## 2020-10-26 ENCOUNTER — Other Ambulatory Visit: Payer: Self-pay

## 2020-10-26 DIAGNOSIS — M2041 Other hammer toe(s) (acquired), right foot: Secondary | ICD-10-CM

## 2020-10-26 DIAGNOSIS — M779 Enthesopathy, unspecified: Secondary | ICD-10-CM

## 2020-10-26 NOTE — Progress Notes (Signed)
[place] Phone: [phone] Subjective:   [name], am serving as a scribe for Dr. Hulan Saas. This visit occurred during the SARS-CoV-2 public health emergency.  Safety protocols were in place, including screening questions prior to the visit, additional usage of staff PPE, and extensive cleaning of exam room while observing appropriate contact time as indicated for disinfecting solutions.   I'm seeing this patient by the request  of:  Baxley, Mary J, MD  CC: Neck pain follow-up  [id number]   08/09/2020 Patient does have significant cervical disc disease of the neck that could be contributing to some more of the back pain of the upper back.  Discussed with patient in great length and encouraged her to start with formal physical therapy for his chronic problem with worsening exacerbation.  Patient is taking 300 mg of gabapentin at night which is helping.  Patient is also on Celebrex.  Pain still seems to come and go.  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  Follow-up with me again 2 months worsening pain will need MRI.  Update 10/27/20[name] is a 65 y.o. female coming in with complaint of cervical spine pain and pain between scapula. Patient states patient has known degenerative disc disease.  Patient did have the increase in the gabapentin to 300 mg. Pain seems to be the same as last visit. Activity makes her pain worse and she feels pain daily.  Patient has done formal physical therapy including dry needling.  Patient continues to do the home exercises.  Continuing to have difficulty overall well.     Past Medical History:  Diagnosis Date  . Allergy   . Arthritis   . Complication of anesthesia   . H/O calcium pyrophosphate deposition disease (CPPD)    . Migraine   . PONV (postoperative nausea and vomiting)    Past Surgical History:  Procedure Laterality Date  . BUNIONECTOMY Left   . CERVICAL DISCECTOMY     C3-4  . EYE SURGERY Bilateral    LASIK  . JOINT REPLACEMENT Bilateral    knees  . KNEE ARTHROSCOPY Left   . KNEE ARTHROSCOPY Right 05/14/2013   Procedure: RIGHT ARTHROSCOPY KNEE;  Surgeon: Gearlean Alf, MD;  Location: [facility];  Service: Orthopedics;  Laterality: Right;   Social History   Socioeconomic History  . Marital status: Married    Spouse name: Not on file  . Number of children: Not on file  . Years of education: Not on file  . Highest education level: Not on file  Occupational History  . Not on file  Tobacco Use  . Smoking status: Never Smoker  . Smokeless tobacco: Never Used  Substance and Sexual Activity  . Alcohol use: Yes    Comment: socially  . Drug use: Sharp  . Sexual activity: Not on file  Other Topics Concern  . Not on file  Social History Narrative  . Not on file   Social Determinants of Health   Financial Resource Strain: Not on file  Food Insecurity: Not on file  Transportation Needs: Not on file  Physical Activity: Not on file  Stress: Not on file  Social Connections: Not on file   Sharp Known Allergies Family History  Problem Relation Age of Onset  . Heart disease Mother   .  Breast cancer Neg Hx     Current Outpatient Medications (Endocrine & Metabolic):  .  denosumab (PROLIA) 60 MG/ML SOSY injection, Inject 60 mg into the skin every 6 (six) months.    Current Outpatient Medications (Analgesics):  .  celecoxib (CELEBREX) 100 MG capsule, Take 1 capsule (100 mg total) by mouth 2 (two) times daily.   Current Outpatient Medications (Other):  Marland Kitchen  Cholecalciferol (VITAMIN D) 2000 UNITS CAPS, Take 2,000 Units by mouth daily. .  diclofenac Sodium (VOLTAREN) 1 % GEL, Apply topically. .  gabapentin (NEURONTIN) 300 MG capsule, Take 1 capsule (300 mg total) by mouth at bedtime. .   melatonin 5 MG TABS, Take by mouth. .  Multiple Vitamins-Minerals (CENTRUM ULTRA WOMENS) TABS, Take by mouth. .  gabapentin (NEURONTIN) 100 MG capsule, Take 2 capsules (200 mg total) by mouth 2 (two) times daily.   Reviewed prior external information including notes and imaging from  primary care provider As well as notes that were available from care everywhere and other healthcare systems.  Past medical history, social, surgical and family history all reviewed in electronic medical record.  Sharp pertanent information unless stated regarding to the chief complaint.   Review of Systems:  Sharp headache, visual changes, nausea, vomiting, diarrhea, constipation, dizziness, abdominal pain, skin rash, fevers, chills, night sweats, weight loss, swollen lymph nodes, joint swelling, chest pain, shortness of breath, mood changes. POSITIVE muscle aches, body aches  Objective  Blood pressure (!) 128/98, pulse 85, height 5\' 4"  (1.626 m), weight 153 lb (69.4 kg), SpO2 98 %.   General: Sharp apparent distress alert and oriented x3 mood and affect normal, dressed appropriately.  HEENT: Pupils equal, extraocular movements intact  Respiratory: Patient's speak in full sentences and does not appear short of breath  Cardiovascular: Sharp lower extremity edema, non tender, Sharp erythema  Gait normal with good balance and coordination.  MSK: Mild arthritic changes of multiple joints Neck exam does have some loss of lordosis.  Patient does have some limited extension of the neck.  Sharp true radicular symptoms but does have crepitus and increasing discomfort and pain.  Patient also has pain to palpation even to moderate palpation in the paraspinal musculature in the parascapular region.  He does have some midline tenderness of the thoracic around to the 2 through T5.    Impression and Recommendations:     The above documentation has been reviewed and is accurate and complete Lyndal Pulley, DO

## 2020-10-26 NOTE — Progress Notes (Signed)
Patient presents today for orthotic pick up. Patient voices no new complaints.  Orthotics were fitted to patient's feet. No discomfort and no rubbing. Patient satisfied with the orthotics.  Orthotics were dispensed to patient with instructions for break in wear and to call the office with any concerns or questions. 

## 2020-10-26 NOTE — Patient Instructions (Signed)

## 2020-10-27 ENCOUNTER — Ambulatory Visit (INDEPENDENT_AMBULATORY_CARE_PROVIDER_SITE_OTHER): Payer: BC Managed Care – PPO | Admitting: Family Medicine

## 2020-10-27 ENCOUNTER — Encounter: Payer: Self-pay | Admitting: Family Medicine

## 2020-10-27 VITALS — BP 128/98 | HR 85 | Ht 64.0 in | Wt 153.0 lb

## 2020-10-27 DIAGNOSIS — M5090 Cervical disc disorder, unspecified, unspecified cervical region: Secondary | ICD-10-CM | POA: Diagnosis not present

## 2020-10-27 DIAGNOSIS — M546 Pain in thoracic spine: Secondary | ICD-10-CM

## 2020-10-27 DIAGNOSIS — M810 Age-related osteoporosis without current pathological fracture: Secondary | ICD-10-CM | POA: Diagnosis not present

## 2020-10-27 DIAGNOSIS — G8929 Other chronic pain: Secondary | ICD-10-CM

## 2020-10-27 NOTE — Assessment & Plan Note (Signed)
Chronic pain that does not seem to be responding to conservative therapy.  History of osteoporosis.  We will get advanced imaging to further evaluate for the possibility of occult fracture or potential syrinx that could be contributing to some of the aches and pains.

## 2020-10-27 NOTE — Patient Instructions (Signed)
MRI Cervical and Thoracic [phone] Will write to you in MyChart and talk about next steps

## 2020-10-27 NOTE — Assessment & Plan Note (Signed)
Patient has significant degenerative disc disease of the cervical spine with advanced arthritic changes.  History of CPPD and we did make significant progress previously but now seems to be stabilized.  We discussed with patient that she has done everything else right at this time including the home exercises, formal physical therapy, dry needling and over-the-counter medications.  Patient would like to avoid a lot of chronic medications if possible.  I do feel at this point advanced imaging would be warranted.  We will see if there is any nerve root impingement or if patient would be a candidate for any type of epidurals in the cervical spine.  Due to the pain in the thoracic spine as well we will get MRI of the thoracic spine to rule out any occult fracture or the possibility of a syrinx that could be contributing.  If all imaging is normal we will consider the osteopathic manipulation again follow-up with me again after imaging.

## 2020-11-03 ENCOUNTER — Other Ambulatory Visit: Payer: Self-pay

## 2020-11-03 ENCOUNTER — Ambulatory Visit
Admission: RE | Admit: 2020-11-03 | Discharge: 2020-11-03 | Disposition: A | Discharge status: Home / Self Care | Payer: BC Managed Care – PPO | Source: Ambulatory Visit | Attending: Family Medicine | Admitting: Family Medicine

## 2020-11-03 DIAGNOSIS — M50123 Cervical disc disorder at C6-C7 level with radiculopathy: Secondary | ICD-10-CM | POA: Diagnosis not present

## 2020-11-03 DIAGNOSIS — M546 Pain in thoracic spine: Secondary | ICD-10-CM

## 2020-11-03 DIAGNOSIS — M4802 Spinal stenosis, cervical region: Secondary | ICD-10-CM | POA: Diagnosis not present

## 2020-11-03 DIAGNOSIS — M47814 Spondylosis without myelopathy or radiculopathy, thoracic region: Secondary | ICD-10-CM | POA: Diagnosis not present

## 2020-11-03 DIAGNOSIS — M4722 Other spondylosis with radiculopathy, cervical region: Secondary | ICD-10-CM | POA: Diagnosis not present

## 2020-11-03 DIAGNOSIS — M5124 Other intervertebral disc displacement, thoracic region: Secondary | ICD-10-CM | POA: Diagnosis not present

## 2020-11-03 DIAGNOSIS — M5090 Cervical disc disorder, unspecified, unspecified cervical region: Secondary | ICD-10-CM

## 2020-11-03 NOTE — Telephone Encounter (Signed)
Prior Auth form faxed via fax machine on 10/20/20.

## 2020-11-04 ENCOUNTER — Encounter: Payer: Self-pay | Admitting: Family Medicine

## 2020-11-04 NOTE — Telephone Encounter (Signed)
Called [employer], was advised to contact [employer] at [phone] for prior auth determination.

## 2020-11-08 ENCOUNTER — Telehealth: Payer: Self-pay | Admitting: Internal Medicine

## 2020-11-08 NOTE — Telephone Encounter (Signed)
Patient called and is having concerns regarding Prolia.  States has talked to so many people about getting it and she is confused and now behind on shot.  Patient requesting a call from Dr Cruzita Lederer direct if possible.  Call back # [phone]

## 2020-11-09 ENCOUNTER — Encounter: Payer: Self-pay | Admitting: Internal Medicine

## 2020-11-10 ENCOUNTER — Other Ambulatory Visit: Payer: Self-pay | Admitting: Family Medicine

## 2020-11-10 NOTE — Telephone Encounter (Signed)
Please advise 

## 2020-11-11 ENCOUNTER — Other Ambulatory Visit: Payer: BC Managed Care – PPO

## 2020-11-11 ENCOUNTER — Other Ambulatory Visit: Payer: Self-pay

## 2020-11-11 DIAGNOSIS — M810 Age-related osteoporosis without current pathological fracture: Secondary | ICD-10-CM | POA: Diagnosis not present

## 2020-11-11 MED ORDER — DENOSUMAB 60 MG/ML ~~LOC~~ SOSY
60.0000 mg | PREFILLED_SYRINGE | Freq: Once | SUBCUTANEOUS | Status: AC
  Administered 2020-11-11: 60 mg via SUBCUTANEOUS

## 2020-11-11 NOTE — Telephone Encounter (Addendum)
Pt ready for scheduling on or after   Out-of-pocket cost due at time of visit: $135.73  Primary:  Prolia co-insurance: 10% ($123.23) Admin fee co-insurance: 10% ($12.50)  Secondary: n/a Prolia co-insurance:  Admin fee co-insurance:   Deductible: does not apply  Prior Auth: APPROVED PA# [id number] Valid: 03/30/20-03/29/21

## 2020-11-11 NOTE — Telephone Encounter (Signed)
Called and advised pt PA [id number] valid 03/30/20-03/29/21 is on file. Pt advised to come in for injection at earliest convenience.

## 2020-11-11 NOTE — Telephone Encounter (Signed)
[place] [employer] [place] at [phone] and was informed that PA has been approved.   PA# [id number] valid 03/30/20-03/29/21

## 2020-11-11 NOTE — Progress Notes (Signed)
Prolia injection administered to left arm. Pt tolerated well.

## 2020-11-16 DIAGNOSIS — L812 Freckles: Secondary | ICD-10-CM | POA: Diagnosis not present

## 2020-11-16 DIAGNOSIS — L821 Other seborrheic keratosis: Secondary | ICD-10-CM | POA: Diagnosis not present

## 2020-11-16 DIAGNOSIS — D1801 Hemangioma of skin and subcutaneous tissue: Secondary | ICD-10-CM | POA: Diagnosis not present

## 2020-11-16 DIAGNOSIS — D225 Melanocytic nevi of trunk: Secondary | ICD-10-CM | POA: Diagnosis not present

## 2020-11-26 ENCOUNTER — Other Ambulatory Visit: Payer: Self-pay | Admitting: Family Medicine

## 2020-12-02 ENCOUNTER — Telehealth: Payer: Self-pay

## 2020-12-02 ENCOUNTER — Telehealth (INDEPENDENT_AMBULATORY_CARE_PROVIDER_SITE_OTHER): Payer: BC Managed Care – PPO | Admitting: Internal Medicine

## 2020-12-02 ENCOUNTER — Telehealth: Payer: Self-pay | Admitting: Internal Medicine

## 2020-12-02 DIAGNOSIS — U071 COVID-19: Secondary | ICD-10-CM

## 2020-12-02 MED ORDER — ONDANSETRON HCL 4 MG PO TABS: 4.0000 mg | ORAL_TABLET | Freq: Three times a day (TID) | ORAL | 0 refills | Status: DC | PRN

## 2020-12-02 NOTE — Progress Notes (Signed)
   Subjective:    Patient ID: [name], female    DOB: 07-Dec-1955, 65 y.o.   MRN: [id number]  HPI 65 year old Female with history of calcium pyrophosphate deposition disease, osteoporosis treated with Prolia, migraine headaches and allergic rhinitis, chronic benign neutropenia called regarding positive home COVID test.  Patient was complaining of headache, sore throat, sinus congestion.  Had some chills last evening.  Due to the Coronavirus pandemic patient is seen via interactive audio and video telecommunications.  She is identified using 2 identifiers as [name], [name], a patient in this practice.  She is agreeable to visit in this format today.  She is at her home and I am at my office.  Video communications failed shortly into the visit, so we continued with audio only.  Patient and her husband were out [place] riding horses [place] a ranch recently.  Thinks she may have contracted COVID during this trip.    Review of Systems malaise, fatigue, headache, no shortness of breath     Objective:   Physical Exam Patient has been monitoring vital signs at home.  She does not sound short of breath.       Assessment & Plan:   Acute COVID-19 virus infection  History of calcium pyrophosphate deposition disease  Chronic musculoskeletal pain  Osteoporosis treated with Prolia  Plan: Patient is to stay well-hydrated.  Rest and drink plenty of fluids.  Prescription called in for Zofran 4 mg every 8 hours as needed for nausea.  Have sent in prescription for Paxlovid regular strength.  Call if symptoms worsen.  Monitor pulse oximetry at home.

## 2020-12-02 NOTE — Telephone Encounter (Signed)
Scheduled

## 2020-12-02 NOTE — Telephone Encounter (Signed)
Patient called has COVID. Symptoms started on Tuesday she has a headache, ST, sinus congestion. She had chills last night and she is wondering if she can get PAXLOVID. Okay to book video visit?   [phone]

## 2020-12-02 NOTE — Telephone Encounter (Signed)
[place] test results to [facility] department [phone] date 12/02/2020

## 2020-12-08 DIAGNOSIS — Z20822 Contact with and (suspected) exposure to covid-19: Secondary | ICD-10-CM | POA: Diagnosis not present

## 2020-12-18 NOTE — Telephone Encounter (Signed)
Pt received Prolia inj 11/11/20 Next due 05/14/21

## 2020-12-26 ENCOUNTER — Encounter: Payer: Self-pay | Admitting: Internal Medicine

## 2020-12-26 NOTE — Patient Instructions (Addendum)
Take Paxlovid as directed for COVID-19 virus infection.  Take Zofran 4 mg tablets every 8 hours as needed for nausea.  Rest and drink plenty of fluids.  Plan to quarantine for minimum of 5 days.  Monitor pulse oximetry at home.  Call if symptoms worsen.

## 2021-01-10 ENCOUNTER — Ambulatory Visit: Payer: BC Managed Care – PPO | Admitting: Podiatry

## 2021-01-14 DIAGNOSIS — H1789 Other corneal scars and opacities: Secondary | ICD-10-CM | POA: Diagnosis not present

## 2021-01-14 DIAGNOSIS — H52203 Unspecified astigmatism, bilateral: Secondary | ICD-10-CM | POA: Diagnosis not present

## 2021-01-19 ENCOUNTER — Other Ambulatory Visit: Payer: Self-pay

## 2021-01-19 ENCOUNTER — Telehealth: Payer: Self-pay | Admitting: Podiatry

## 2021-01-19 ENCOUNTER — Encounter: Payer: Self-pay | Admitting: Podiatry

## 2021-01-19 ENCOUNTER — Ambulatory Visit (INDEPENDENT_AMBULATORY_CARE_PROVIDER_SITE_OTHER): Payer: BC Managed Care – PPO | Admitting: Podiatry

## 2021-01-19 ENCOUNTER — Ambulatory Visit: Payer: BC Managed Care – PPO

## 2021-01-19 DIAGNOSIS — M779 Enthesopathy, unspecified: Secondary | ICD-10-CM | POA: Diagnosis not present

## 2021-01-19 DIAGNOSIS — Z9889 Other specified postprocedural states: Secondary | ICD-10-CM

## 2021-01-19 DIAGNOSIS — M7751 Other enthesopathy of right foot: Secondary | ICD-10-CM

## 2021-01-19 DIAGNOSIS — M2041 Other hammer toe(s) (acquired), right foot: Secondary | ICD-10-CM | POA: Diagnosis not present

## 2021-01-19 NOTE — Telephone Encounter (Signed)
Faxed orders to [facility] to make an additional pair of orthotics for pt just like last pair.

## 2021-01-19 NOTE — Progress Notes (Signed)
Subjective:   Patient ID: [name], female   DOB: 65 y.o.   MRN: [id number]   HPI Patient presents stating that the orthotics have been helpful for her but she still getting some discomfort in her feet and she occasionally gets pain in the big toe joint right but overall is very happy with the results of her surgical procedures neuro   ROS      Objective:  Physical Exam  Vascular status intact range of motion adequate first MPJ right no crepitus of the joint with excellent alignment of the lesser digits with still mild swelling secondary to the surgery     Assessment:  Overall doing well with patient found to have mild symptomatology still present not significant at the current time     Plan:  H&P reviewed condition recommended the usage of orthotics to be continued and we will make her a second pair to try to help so she can wear them more frequently.  Overall I do think she still can continue to improve and I made significant recommendations on types of shoes that she should wear types of activities she should do and merging these 2 together.  Patient will be seen back to reevaluate overall and pleased

## 2021-01-28 ENCOUNTER — Telehealth: Payer: Self-pay | Admitting: Podiatry

## 2021-01-28 NOTE — Telephone Encounter (Signed)
2nd pr orthotics in.. pt aware ok to pick up.Marland KitchenMarland Kitchen

## 2021-01-29 ENCOUNTER — Other Ambulatory Visit: Payer: Self-pay | Admitting: Family Medicine

## 2021-02-01 ENCOUNTER — Other Ambulatory Visit: Payer: Self-pay | Admitting: Family Medicine

## 2021-02-01 DIAGNOSIS — L820 Inflamed seborrheic keratosis: Secondary | ICD-10-CM | POA: Diagnosis not present

## 2021-04-07 ENCOUNTER — Ambulatory Visit (INDEPENDENT_AMBULATORY_CARE_PROVIDER_SITE_OTHER): Payer: BC Managed Care – PPO | Admitting: Sports Medicine

## 2021-04-07 DIAGNOSIS — G8929 Other chronic pain: Secondary | ICD-10-CM

## 2021-04-07 DIAGNOSIS — M546 Pain in thoracic spine: Secondary | ICD-10-CM

## 2021-04-07 NOTE — Assessment & Plan Note (Signed)
I think her thoracic kyphosis has been slowly progressive. This contributes to her chronic upper back pan.  I believe this can reverse with posture work/ scap strengthening series Avoiding head forward prolonged position (change position for knitting.) Reck 1 month

## 2021-04-07 NOTE — Patient Instructions (Signed)
It was great to see you today!  Please start doing the exercises and stretches we gave you. See the handout. Try and be consistent and do these daily for the next 4 weeks. Focus on your posture as well. Follow up with [place] in 1 month. Call with questions in the meantime.

## 2021-04-07 NOTE — Progress Notes (Signed)
PCP: Elby Showers, MD  Subjective:   HPI: Patient is a 65 y.o. female here for upper back pain.  Mrs. Mccurdy's presenting with continued upper back pain.  Pain started abruptly about 2 and half years ago.  She has been seeing Dr. Tamala Julian for this over the past 2 years.  She has undergone rib manipulation, PT with dry needling and has been using gabapentin. GB does help and is at low dose of 300 mg at night. Marlana Salvage says her pain is located underneath of the right shoulder blade, constant, and without regard to movement.  She describes it is achy, and reproducible.  The severity changes throughout the day.  She does sometimes get pain with inspiration.  She did undergo MRI imaging which showed full surgical changes s/p C3-4 ACDF without significant spinal canal or neuroforaminal stenosis, degenerative changes of the cervical spine with moderate left neural foraminal narrowing at C4-5 and mild bilateral at C5-6 and C6/7, mild degenerative changes of the thoracic spine without spinal canal or neuroforaminal stenosis.  Past Medical History:  Diagnosis Date   Allergy    Arthritis    Complication of anesthesia    H/O calcium pyrophosphate deposition disease (CPPD)    Migraine    PONV (postoperative nausea and vomiting)     Current Outpatient Medications on File Prior to Visit  Medication Sig Dispense Refill   celecoxib (CELEBREX) 100 MG capsule TAKE ONE CAPSULE BY MOUTH TWICE DAILY 180 capsule 0   Cholecalciferol (VITAMIN D) 2000 UNITS CAPS Take 2,000 Units by mouth daily.     denosumab (PROLIA) 60 MG/ML SOSY injection Inject 60 mg into the skin every 6 (six) months. 1 mL 1   diclofenac Sodium (VOLTAREN) 1 % GEL Apply topically.     gabapentin (NEURONTIN) 100 MG capsule Take 2 capsules (200 mg total) by mouth 2 (two) times daily. 120 capsule 3   gabapentin (NEURONTIN) 300 MG capsule TAKE ONE CAPSULE BY MOUTH AT BEDTIME 90 capsule 0   melatonin 5 MG TABS Take by mouth.     Multiple  Vitamins-Minerals (CENTRUM ULTRA WOMENS) TABS Take by mouth.     ondansetron (ZOFRAN) 4 MG tablet Take 1 tablet (4 mg total) by mouth every 8 (eight) hours as needed for nausea or vomiting. 30 tablet 0   No current facility-administered medications on file prior to visit.    Past Surgical History:  Procedure Laterality Date   BUNIONECTOMY Left    CERVICAL DISCECTOMY     C3-4   EYE SURGERY Bilateral    LASIK   JOINT REPLACEMENT Bilateral    knees   KNEE ARTHROSCOPY Left    KNEE ARTHROSCOPY Right 05/14/2013   Procedure: RIGHT ARTHROSCOPY KNEE;  Surgeon: Gearlean Alf, MD;  Location: [facility];  Service: Orthopedics;  Laterality: Right;    No Known Allergies  BP (!) 132/92   Ht 5\' 5"  (1.651 m)   Wt 148 lb (67.1 kg)   BMI 24.63 kg/m   [facility] Exercise 04/07/2021  Frequency of aerobic exercise (# of days/week) 3  Average time in minutes 45  Frequency of strengthening activities (# of days/week) 0    No flowsheet data found.      Objective:  Physical Exam:  Gen: NAD, comfortable in exam room HEENT: Normocephalic, atraumatic Respiratory: No acute respiratory distress, breathing and speaking comfortably Psych: Normal mood, thought content MSK: Moderate thoracic kyphosis, TTP at the medial border of the L scapula at T2-3 level,  hypertonicity of the R paraspinal muscles T2-T5, full AROM in shoulder flexion and abduction with no scapular dyskinesia bilaterally   Assessment & Plan:  1. Thoracic strain secondary to kyphosis - HEP to include I and T stretches, upright and lateral rows, lawnmower rows - Give specific attention to correcting posture- - Continue gabapentin if helping. Can consider deep tissue massage - Follow up in 1 month to assess improvement  Adolm Joseph, DO Family Medicine, PGY-3  I observed and examined the patient with the resident and agree with assessment and plan.  Note reviewed and modified by me. Ila Mcgill, MD

## 2021-04-12 NOTE — Telephone Encounter (Signed)
Prolia VOB initiated via [email or web address]  Last OV:  Next OV:  Last Prolia inj: 11/11/20 Next Prolia inj DUE: 05/14/21

## 2021-04-15 ENCOUNTER — Encounter: Payer: Self-pay | Admitting: Internal Medicine

## 2021-04-21 ENCOUNTER — Other Ambulatory Visit: Payer: Self-pay | Admitting: Family Medicine

## 2021-05-01 NOTE — Telephone Encounter (Signed)
VOB request re-submitted.

## 2021-05-05 ENCOUNTER — Ambulatory Visit (INDEPENDENT_AMBULATORY_CARE_PROVIDER_SITE_OTHER): Payer: BC Managed Care – PPO | Admitting: Sports Medicine

## 2021-05-05 DIAGNOSIS — M5134 Other intervertebral disc degeneration, thoracic region: Secondary | ICD-10-CM | POA: Diagnosis not present

## 2021-05-05 MED ORDER — PREDNISONE 20 MG PO TABS: 20.0000 mg | ORAL_TABLET | Freq: Two times a day (BID) | ORAL | 0 refills | Status: DC

## 2021-05-05 NOTE — Assessment & Plan Note (Signed)
I believe she likely has thoracic degenerative disc disease based on the MRI and her longstanding symptoms  Trial for 7 to 10 days on prednisone 20 twice daily If this lessens her symptoms significantly I think it would suggest that the thoracic disc issues are more likely to be correct Continue the nighttime low-dose gabapentin Recheck in about 1 month and perhaps put on a progressive dose of gabapentin if she gets some response to the prednisone

## 2021-05-05 NOTE — Progress Notes (Signed)
Patient last seen with thoracic area back pain  Patient has been evaluated over the past 2-1/2 to 3 years for thoracic area back pain that becomes sharp moving toward her right scapula She has had MRIs of her cervical and thoracic area These show significant kyphosis Review by her husband who is a radiologist suggest degenerative disc disease at T1 and T2  We gave her scapular stabilization exercises and exercises to improve her posture, head forward position and lessen her kyphosis  These did improve her posture but have not made much difference in the pain  So far she has tried manipulation by Dr. Tamala Julian and that was uncomfortable Dry needling which helped only briefly She recently did some low-dose gabapentin and has not seen much difference to this point  Physical exam Pleasant older female in no acute distress BP (!) 119/52   Ht 5\' 4"  (1.626 m)   Wt 145 lb (65.8 kg)   BMI 24.89 kg/m  [place] Adult Exercise 04/07/2021  Frequency of aerobic exercise (# of days/week) 3  Average time in minutes 45  Frequency of strengthening activities (# of days/week) 0   Patient shows significant improvement in her posture She still has a tendency for a head forward position and slight kyphosis Reexamination of the upper back reveals tenderness over the upper thoracic vertebrae Facet joints below and above this have better movement There is no palpable defect in the periscapular areas There is no scapular dysfunction on movement  I reviewed her MRI with her today and I actually think the upper for thoracic disc looks somewhat compressed and have poor hydration

## 2021-05-07 NOTE — Telephone Encounter (Signed)
Prior Auth required for Prolia.   PA PROCESS DETAILS: PA is required. Complete the PA form & fax with clinical notes to Medical Management at [phone]. [email or web address]

## 2021-05-09 DIAGNOSIS — Z13820 Encounter for screening for osteoporosis: Secondary | ICD-10-CM | POA: Diagnosis not present

## 2021-05-15 NOTE — Telephone Encounter (Signed)
Prior Auth initiated via [email or web address] KEY: [id number]

## 2021-05-17 ENCOUNTER — Other Ambulatory Visit: Payer: Self-pay | Admitting: Family Medicine

## 2021-05-23 ENCOUNTER — Telehealth: Payer: Self-pay | Admitting: Internal Medicine

## 2021-05-23 NOTE — Telephone Encounter (Signed)
YI FALLETTA [employer]:[id number] Incorrect paperwork was submitted thru the Pharmacy and should have been submitted via Medical ([employer]). Please view last year process and copy for this year. This is concerning Prolia injection. Call PT at [phone].

## 2021-06-01 NOTE — Telephone Encounter (Signed)
Called pt's insurance regarding previously submitted request. Case ID: [id number] - was pending awaiting clinical support.  Questions answered over the phone but clinical can be faxed to [phone] (was advised not necessary).  Request updated to expedited and has a 72 hour turn around timeframe. Pt called and advised of update.

## 2021-06-01 NOTE — Telephone Encounter (Signed)
Called and spoke with pt. Apologized for excessive hold time and explained when her message was received I contacted her insurance and followed up on previously submitted PA. Required information was submitted and we are awaiting a determination. Request updated to Expedited and will follow up tomorrow.

## 2021-06-02 ENCOUNTER — Other Ambulatory Visit: Payer: Self-pay

## 2021-06-02 ENCOUNTER — Ambulatory Visit (INDEPENDENT_AMBULATORY_CARE_PROVIDER_SITE_OTHER): Payer: BC Managed Care – PPO

## 2021-06-02 DIAGNOSIS — M810 Age-related osteoporosis without current pathological fracture: Secondary | ICD-10-CM | POA: Diagnosis not present

## 2021-06-02 MED ORDER — DENOSUMAB 60 MG/ML ~~LOC~~ SOSY
60.0000 mg | PREFILLED_SYRINGE | Freq: Once | SUBCUTANEOUS | Status: AC
  Administered 2021-06-02: 13:00:00 60 mg via SUBCUTANEOUS

## 2021-06-02 NOTE — Progress Notes (Signed)
Prolia injection administered to pt's left arm. Pt tolerated well. °

## 2021-06-02 NOTE — Telephone Encounter (Signed)
[facility] UM department at [phone] and confirmed Case ID: [id number] was approved 05/18/21 to 05/17/22. Pt contacted and scheduled for injection.

## 2021-06-03 ENCOUNTER — Ambulatory Visit: Payer: BC Managed Care – PPO

## 2021-06-07 NOTE — Telephone Encounter (Signed)
[name], RMA 6 days ago   [employer] regarding previously submitted request. Case ID: [id number] - was pending awaiting clinical support.  Questions answered over the phone but clinical can be faxed to [phone] (was advised not necessary).  Request updated to expedited and has a 72 hour turn around timeframe. Pt called and advised of update.

## 2021-06-14 ENCOUNTER — Ambulatory Visit: Payer: BC Managed Care – PPO | Admitting: Sports Medicine

## 2021-06-16 ENCOUNTER — Other Ambulatory Visit: Payer: BC Managed Care – PPO | Admitting: Internal Medicine

## 2021-06-16 ENCOUNTER — Other Ambulatory Visit: Payer: Self-pay

## 2021-06-16 DIAGNOSIS — R7989 Other specified abnormal findings of blood chemistry: Secondary | ICD-10-CM

## 2021-06-16 DIAGNOSIS — M1990 Unspecified osteoarthritis, unspecified site: Secondary | ICD-10-CM

## 2021-06-16 DIAGNOSIS — Z1329 Encounter for screening for other suspected endocrine disorder: Secondary | ICD-10-CM

## 2021-06-16 DIAGNOSIS — Z0000 Encounter for general adult medical examination without abnormal findings: Secondary | ICD-10-CM

## 2021-06-16 LAB — CBC WITH DIFFERENTIAL/PLATELET
Absolute Monocytes: 336 cells/uL (ref 200–950)
Basophils Absolute: 39 cells/uL (ref 0–200)
Basophils Relative: 1.3 %
Eosinophils Absolute: 99 cells/uL (ref 15–500)
Eosinophils Relative: 3.3 %
HCT: 41.1 % (ref 35.0–45.0)
Hemoglobin: 13.3 g/dL (ref 11.7–15.5)
Lymphs Abs: 1530 cells/uL (ref 850–3900)
MCH: 30.4 pg (ref 27.0–33.0)
MCHC: 32.4 g/dL (ref 32.0–36.0)
MCV: 93.8 fL (ref 80.0–100.0)
MPV: 9.4 fL (ref 7.5–12.5)
Monocytes Relative: 11.2 %
Neutro Abs: 996 cells/uL — ABNORMAL LOW (ref 1500–7800)
Neutrophils Relative %: 33.2 %
Platelets: 316 10*3/uL (ref 140–400)
RBC: 4.38 10*6/uL (ref 3.80–5.10)
RDW: 12.9 % (ref 11.0–15.0)
Total Lymphocyte: 51 %
WBC: 3 10*3/uL — ABNORMAL LOW (ref 3.8–10.8)

## 2021-06-16 LAB — LIPID PANEL
Cholesterol: 243 mg/dL — ABNORMAL HIGH (ref <200)
HDL: 95 mg/dL (ref >=50)
LDL Cholesterol (Calc): 132 mg/dL (calc) — ABNORMAL HIGH
Non-HDL Cholesterol (Calc): 148 mg/dL (calc) — ABNORMAL HIGH (ref <130)
Total CHOL/HDL Ratio: 2.6 (calc) (ref <5.0)
Triglycerides: 72 mg/dL (ref <150)

## 2021-06-16 LAB — TSH: TSH: 1.73 mIU/L (ref 0.40–4.50)

## 2021-06-16 LAB — COMPLETE METABOLIC PANEL WITH GFR
AG Ratio: 1.4 (calc) (ref 1.0–2.5)
ALT: 15 U/L (ref 6–29)
AST: 25 U/L (ref 10–35)
Albumin: 4.1 g/dL (ref 3.6–5.1)
Alkaline phosphatase (APISO): 58 U/L (ref 37–153)
BUN: 11 mg/dL (ref 7–25)
CO2: 30 mmol/L (ref 20–32)
Calcium: 9.3 mg/dL (ref 8.6–10.4)
Chloride: 106 mmol/L (ref 98–110)
Creat: 0.79 mg/dL (ref 0.50–1.05)
Globulin: 3 g/dL (calc) (ref 1.9–3.7)
Glucose, Bld: 89 mg/dL (ref 65–99)
Potassium: 4.7 mmol/L (ref 3.5–5.3)
Sodium: 141 mmol/L (ref 135–146)
Total Bilirubin: 0.6 mg/dL (ref 0.2–1.2)
Total Protein: 7.1 g/dL (ref 6.1–8.1)
eGFR: 84 mL/min/{1.73_m2} (ref >=60)

## 2021-06-16 NOTE — Telephone Encounter (Signed)
Last Prolia inj 06/02/21 Next Prolia inj due 12/02/21

## 2021-06-16 NOTE — Addendum Note (Signed)
Addended by: Angus Seller on: 06/16/2021 09:46 AM   Modules accepted: Orders

## 2021-06-17 ENCOUNTER — Ambulatory Visit: Payer: BC Managed Care – PPO | Admitting: Podiatry

## 2021-06-20 ENCOUNTER — Encounter: Payer: Self-pay | Admitting: Internal Medicine

## 2021-06-20 ENCOUNTER — Other Ambulatory Visit: Payer: Self-pay

## 2021-06-20 ENCOUNTER — Ambulatory Visit (INDEPENDENT_AMBULATORY_CARE_PROVIDER_SITE_OTHER): Payer: BC Managed Care – PPO | Admitting: Internal Medicine

## 2021-06-20 VITALS — BP 130/82 | HR 69 | Temp 98.0°F | Ht 63.5 in | Wt 150.0 lb

## 2021-06-20 DIAGNOSIS — R7989 Other specified abnormal findings of blood chemistry: Secondary | ICD-10-CM

## 2021-06-20 DIAGNOSIS — E7800 Pure hypercholesterolemia, unspecified: Secondary | ICD-10-CM

## 2021-06-20 DIAGNOSIS — M8580 Other specified disorders of bone density and structure, unspecified site: Secondary | ICD-10-CM

## 2021-06-20 DIAGNOSIS — Z0000 Encounter for general adult medical examination without abnormal findings: Secondary | ICD-10-CM

## 2021-06-20 DIAGNOSIS — Z8739 Personal history of other diseases of the musculoskeletal system and connective tissue: Secondary | ICD-10-CM

## 2021-06-20 DIAGNOSIS — M542 Cervicalgia: Secondary | ICD-10-CM

## 2021-06-20 LAB — POCT URINALYSIS DIPSTICK
Bilirubin, UA: NEGATIVE
Blood, UA: NEGATIVE
Glucose, UA: NEGATIVE
Ketones, UA: NEGATIVE
Leukocytes, UA: NEGATIVE
Nitrite, UA: NEGATIVE
Protein, UA: NEGATIVE
Spec Grav, UA: 1.015 (ref 1.010–1.025)
Urobilinogen, UA: 0.2 E.U./dL
pH, UA: 6.5 (ref 5.0–8.0)

## 2021-06-20 MED ORDER — ROSUVASTATIN CALCIUM 5 MG PO TABS: 5.0000 mg | ORAL_TABLET | Freq: Every day | ORAL | 1 refills | Status: DC

## 2021-06-20 NOTE — Patient Instructions (Addendum)
It was a pleasure to see you today.  Try Crestor 5 mg daily and follow-up in 3 months.  Continue Prolia per Endocrinology.

## 2021-06-20 NOTE — Progress Notes (Signed)
° ° ° ° °  Subjective:    Patient ID: [name], female    DOB: [birth year], 66 y.o.   MRN: [id number]  HPI  66 year old Female Seen for health maintenance exam and evaluation of medical issues.  She has a history of calcium pyrophosphate deposition disease (CPPD), migraine headaches, allergic rhinitis.  In 2012 she underwent bilateral knee arthroplasties.  Is allergic to grass, pollens and molds.  No known drug allergies.  Some issues with muscle contraction headaches occasionally starting in her neck.   Had COVID-19 virus infection in June 2022.  Had COVID booster October 2022.  Tdap is up-to-date having been given in 2014.  Gets annual flu vaccine.  Colonoscopy up to date with repeat study due next year by Dr. Watt Climes.  Saw Dr. Cruzita Lederer regarding osteopenia and had Prolia injection end of December.  Bone density study done at [facility] in late 2020 showed osteopenia with lowest T score -2.2 in the spine.  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  Husband is a Stage manager.  Has adult children, son and a daughter.  Non-smoker.  Social alcohol consumption.  Family history: Father died of aplastic anemia with history of CVA.  Mother with history of subependymoma of fourth ventricle patient's sister died of lower extremity sarcoma at the age of 101.   Review of Systems see above.  Denies chest pain, shortness of breath, extremity weakness     Objective:   Physical Exam Blood pressure 130/82 pulse 69 temperature 98 degrees pulse oximetry 98% weight 150 pounds BMI 26.15  Skin: Warm and dry.  No cervical adenopathy.  No thyromegaly.  No carotid bruits.  Chest is clear to auscultation without rales or wheezing.  Cardiac exam: Regular rate and rhythm without murmur or ectopy.  Abdomen is soft nondistended without hepatosplenomegaly masses or tenderness.  GYN exam deferred to gynecology.  No lower extremity edema.  No joints with increased warmth or  redness.  Neurological exam: She is alert and oriented x3.  No focal deficits on brief neurological exam.  Judgment affect and thought are normal.       Assessment & Plan:  Normal health maintenance exam  History of CPPD-continues to have chronic knee pain.  Currently on Celebrex and Neurontin.  Osteopenia-on Prolia per Dr. Cruzita Lederer.  Lowest T score in 2020 was -2.2 at [facility]  History of COVID-19 virus infection June 2022 and did well  Elevated LDL-has increased from 116 when checked 2 years ago to 132.  Total cholesterol has increased from 2 32 to 2 43.  Has excellent HDL of 95.  Triglycerides normal at 72.  Hesitant to take statin medication due to arthralgias but I have persuaded her to try Crestor 5 mg daily with follow-up in 3 months.  Order placed for CT cardiac scoring  Has seen Charlann Boxer, MD for cervical disc disease follow-up in May 2022.  Physical therapy recommended as well as gabapentin  History of migraine headaches  Plan: Follow-up on lipids in 3 months.

## 2021-06-28 ENCOUNTER — Other Ambulatory Visit: Payer: Self-pay

## 2021-06-28 ENCOUNTER — Ambulatory Visit (HOSPITAL_BASED_OUTPATIENT_CLINIC_OR_DEPARTMENT_OTHER)
Admission: RE | Admit: 2021-06-28 | Discharge: 2021-06-28 | Disposition: A | Discharge status: Home / Self Care | Payer: BC Managed Care – PPO | Source: Ambulatory Visit | Attending: Internal Medicine | Admitting: Internal Medicine

## 2021-06-28 DIAGNOSIS — I700 Atherosclerosis of aorta: Secondary | ICD-10-CM | POA: Insufficient documentation

## 2021-06-28 DIAGNOSIS — Z136 Encounter for screening for cardiovascular disorders: Secondary | ICD-10-CM | POA: Insufficient documentation

## 2021-06-28 DIAGNOSIS — E785 Hyperlipidemia, unspecified: Secondary | ICD-10-CM | POA: Insufficient documentation

## 2021-07-25 ENCOUNTER — Encounter: Payer: Self-pay | Admitting: Internal Medicine

## 2021-07-25 DIAGNOSIS — Z01419 Encounter for gynecological examination (general) (routine) without abnormal findings: Secondary | ICD-10-CM | POA: Diagnosis not present

## 2021-07-25 DIAGNOSIS — Z6826 Body mass index (BMI) 26.0-26.9, adult: Secondary | ICD-10-CM | POA: Diagnosis not present

## 2021-07-25 DIAGNOSIS — Z1151 Encounter for screening for human papillomavirus (HPV): Secondary | ICD-10-CM | POA: Diagnosis not present

## 2021-07-25 LAB — HM PAP SMEAR: HM Pap smear: NEGATIVE

## 2021-07-27 ENCOUNTER — Other Ambulatory Visit: Payer: Self-pay | Admitting: Obstetrics & Gynecology

## 2021-07-27 DIAGNOSIS — Z1231 Encounter for screening mammogram for malignant neoplasm of breast: Secondary | ICD-10-CM

## 2021-07-27 DIAGNOSIS — N6320 Unspecified lump in the left breast, unspecified quadrant: Secondary | ICD-10-CM

## 2021-08-02 ENCOUNTER — Telehealth: Payer: Self-pay

## 2021-08-02 NOTE — Telephone Encounter (Signed)
Inbound call from [facility] for information regarding pt's previous Prolia injections. Returned call and lvm for Dorian Pod to call back.

## 2021-08-03 NOTE — Telephone Encounter (Signed)
Inbound call from Dorian Pod asking about Prolia authorization portal. If medication was ordered from specialty pharmacy or regular pharmacy.  ?

## 2021-08-04 NOTE — Telephone Encounter (Signed)
Called and left a detailed vm for [place] with PA information for her to updated as needed. ?

## 2021-08-16 ENCOUNTER — Other Ambulatory Visit: Payer: BC Managed Care – PPO

## 2021-08-18 ENCOUNTER — Ambulatory Visit: Payer: BC Managed Care – PPO | Admitting: Internal Medicine

## 2021-08-31 ENCOUNTER — Other Ambulatory Visit: Payer: Self-pay | Admitting: Family Medicine

## 2021-08-31 ENCOUNTER — Other Ambulatory Visit: Payer: Self-pay

## 2021-08-31 MED ORDER — GABAPENTIN 300 MG PO CAPS: 300.0000 mg | ORAL_CAPSULE | Freq: Every day | ORAL | 0 refills | Status: DC

## 2021-08-31 MED ORDER — CELECOXIB 100 MG PO CAPS: 100.0000 mg | ORAL_CAPSULE | Freq: Two times a day (BID) | ORAL | 0 refills | Status: DC

## 2021-09-06 DIAGNOSIS — H2000 Unspecified acute and subacute iridocyclitis: Secondary | ICD-10-CM | POA: Diagnosis not present

## 2021-09-06 DIAGNOSIS — H5711 Ocular pain, right eye: Secondary | ICD-10-CM | POA: Diagnosis not present

## 2021-09-06 DIAGNOSIS — H1789 Other corneal scars and opacities: Secondary | ICD-10-CM | POA: Diagnosis not present

## 2021-09-19 ENCOUNTER — Other Ambulatory Visit: Payer: BC Managed Care – PPO

## 2021-09-19 DIAGNOSIS — E7800 Pure hypercholesterolemia, unspecified: Secondary | ICD-10-CM

## 2021-09-19 LAB — LIPID PANEL
Cholesterol: 168 mg/dL (ref <200)
HDL: 91 mg/dL (ref >=50)
LDL Cholesterol (Calc): 63 mg/dL (calc)
Non-HDL Cholesterol (Calc): 77 mg/dL (calc) (ref <130)
Total CHOL/HDL Ratio: 1.8 (calc) (ref <5.0)
Triglycerides: 60 mg/dL (ref <150)

## 2021-09-19 LAB — HEPATIC FUNCTION PANEL
AG Ratio: 1.6 (calc) (ref 1.0–2.5)
ALT: 18 U/L (ref 6–29)
AST: 23 U/L (ref 10–35)
Albumin: 4.4 g/dL (ref 3.6–5.1)
Alkaline phosphatase (APISO): 59 U/L (ref 37–153)
Bilirubin, Direct: 0.1 mg/dL (ref 0.0–0.2)
Globulin: 2.7 g/dL (calc) (ref 1.9–3.7)
Indirect Bilirubin: 0.5 mg/dL (calc) (ref 0.2–1.2)
Total Bilirubin: 0.6 mg/dL (ref 0.2–1.2)
Total Protein: 7.1 g/dL (ref 6.1–8.1)

## 2021-09-20 ENCOUNTER — Encounter: Payer: Self-pay | Admitting: Internal Medicine

## 2021-09-20 ENCOUNTER — Ambulatory Visit (INDEPENDENT_AMBULATORY_CARE_PROVIDER_SITE_OTHER): Payer: BC Managed Care – PPO | Admitting: Internal Medicine

## 2021-09-20 VITALS — BP 110/80 | HR 73 | Temp 97.5°F | Ht 63.5 in | Wt 150.5 lb

## 2021-09-20 DIAGNOSIS — E7800 Pure hypercholesterolemia, unspecified: Secondary | ICD-10-CM

## 2021-09-20 DIAGNOSIS — M8580 Other specified disorders of bone density and structure, unspecified site: Secondary | ICD-10-CM | POA: Diagnosis not present

## 2021-09-20 DIAGNOSIS — H5711 Ocular pain, right eye: Secondary | ICD-10-CM | POA: Diagnosis not present

## 2021-09-20 DIAGNOSIS — Z8739 Personal history of other diseases of the musculoskeletal system and connective tissue: Secondary | ICD-10-CM | POA: Diagnosis not present

## 2021-09-20 DIAGNOSIS — H2000 Unspecified acute and subacute iridocyclitis: Secondary | ICD-10-CM | POA: Diagnosis not present

## 2021-09-20 NOTE — Progress Notes (Signed)
? ?  Subjective:  ? ? Patient ID: [name], female    DOB: 03-Feb-1956, 66 y.o.   MRN: [id number] ? ?HPI 66 year old Female  for follow up.  She was seen in January for health maintenance exam and at that time had elevated total cholesterol and elevated LDL.  Was started on Crestor 5 mg daily and is now here for follow-up.  Had CT cardiac scoring and score was 0 which is excellent.  Tolerating statin without difficulty. ? ?History of calcium pyrophosphate deposition disease with chronic musculoskeletal pain. ? ?Index finger was injured recently.  Accidentally was caught in a cabinet door.  There is tenderness medial aspect proximal  index finger just above the MCP joint with small palpable raised area that is tender.  Patient suspects a fracture.  Offered x-ray but patient declined. ? ?She saw sports medicine physician Dr. Charlann Boxer, recently who placed her on gabapentin and that was helpful for her pain.  She is also taking Celebrex.  Happy to refill both of these if she so desires. ? ?Dr. Letta Median has her on Prolia every 6 months for osteoporosis.  Bone density study in 2020 showed lowest T score -2.2 in the LS spine. ? ?Saw Dr. Lynnette Caffey, GYN in February 2023.  Colonoscopy up-to-date.  Last done in 2019 and due again in 2029. ? ?Tetanus immunization up-to-date.  Had flu vaccine October 2022.  Consider pneumococcal -20 1 vaccine.  Consider Shingrix vaccine.  Last COVID-vaccine October 2022 ? ? ?Currently on Crestor 5 mg daily since January and lipids have normalized.  Previously total cholesterol was 243 with an LDL cholesterol 132.  Now total cholesterol is 168 and LDL is 63.  Has excellent HDL of 91 and normal triglycerides.  Liver functions are normal. ? ? ? ?Review of Systems Now Lactose intolerant. Takes Lactaid sometimes.  ? ?   ?Objective:  ? Physical Exam ?Blood pressure 110/80 pulse 73 pulse oximetry 98% weight 150 pounds 8 ounces height 5 feet 3.5 inches BMI 26.28 ? ?Index finger examined and described  above. ? ? ? ?   ?Assessment & Plan:  ? ?Pure hypercholesterolemia with excellent response to low-dose Crestor ? ?CPPD disease-Dr. Tamala Julian is placed her on gabapentin.  Continue Celebrex. ? ?Index finger injury-should resolve ? ?Osteopenia being treated with Prolia ? ?GYN is ordered mammogram ? ?Colonoscopy up-to-date ? ?Overall seems to be doing well and can return in January 2024 for health maintenance exam and fasting labs. ?

## 2021-10-03 ENCOUNTER — Ambulatory Visit
Admission: RE | Admit: 2021-10-03 | Discharge: 2021-10-03 | Disposition: A | Discharge status: Home / Self Care | Payer: BC Managed Care – PPO | Source: Ambulatory Visit | Attending: Obstetrics & Gynecology | Admitting: Obstetrics & Gynecology

## 2021-10-03 DIAGNOSIS — Z1231 Encounter for screening mammogram for malignant neoplasm of breast: Secondary | ICD-10-CM | POA: Diagnosis not present

## 2021-10-04 ENCOUNTER — Other Ambulatory Visit: Payer: Self-pay | Admitting: Obstetrics & Gynecology

## 2021-10-04 DIAGNOSIS — R928 Other abnormal and inconclusive findings on diagnostic imaging of breast: Secondary | ICD-10-CM

## 2021-10-07 ENCOUNTER — Ambulatory Visit
Admission: RE | Admit: 2021-10-07 | Discharge: 2021-10-07 | Disposition: A | Discharge status: Home / Self Care | Payer: BC Managed Care – PPO | Source: Ambulatory Visit | Attending: Obstetrics & Gynecology | Admitting: Obstetrics & Gynecology

## 2021-10-07 ENCOUNTER — Other Ambulatory Visit: Payer: Self-pay | Admitting: Obstetrics & Gynecology

## 2021-10-07 DIAGNOSIS — R928 Other abnormal and inconclusive findings on diagnostic imaging of breast: Secondary | ICD-10-CM

## 2021-10-07 DIAGNOSIS — N6311 Unspecified lump in the right breast, upper outer quadrant: Secondary | ICD-10-CM | POA: Diagnosis not present

## 2021-10-07 DIAGNOSIS — N6313 Unspecified lump in the right breast, lower outer quadrant: Secondary | ICD-10-CM | POA: Diagnosis not present

## 2021-10-10 ENCOUNTER — Other Ambulatory Visit: Payer: BC Managed Care – PPO

## 2021-10-12 NOTE — Telephone Encounter (Signed)
Prolia VOB initiated via [email or web address] ? ?Last Prolia inj 06/02/21 ?Next Prolia inj due 12/02/21 ? ? ?

## 2021-10-21 IMAGING — MG MM DIGITAL SCREENING BILAT W/ TOMO AND CAD
8 series · 9 of 24 positions shown · non-contrast
Comparison: Previous exam(s).

CLINICAL DATA: Screening.

EXAM:
DIGITAL SCREENING BILATERAL MAMMOGRAM WITH TOMOSYNTHESIS AND CAD
TECHNIQUE: Bilateral screening digital craniocaudal and mediolateral oblique
mammograms were obtained. Bilateral screening digital breast
tomosynthesis was performed. The images were evaluated with
computer-aided detection.

[L CC synth-2D]
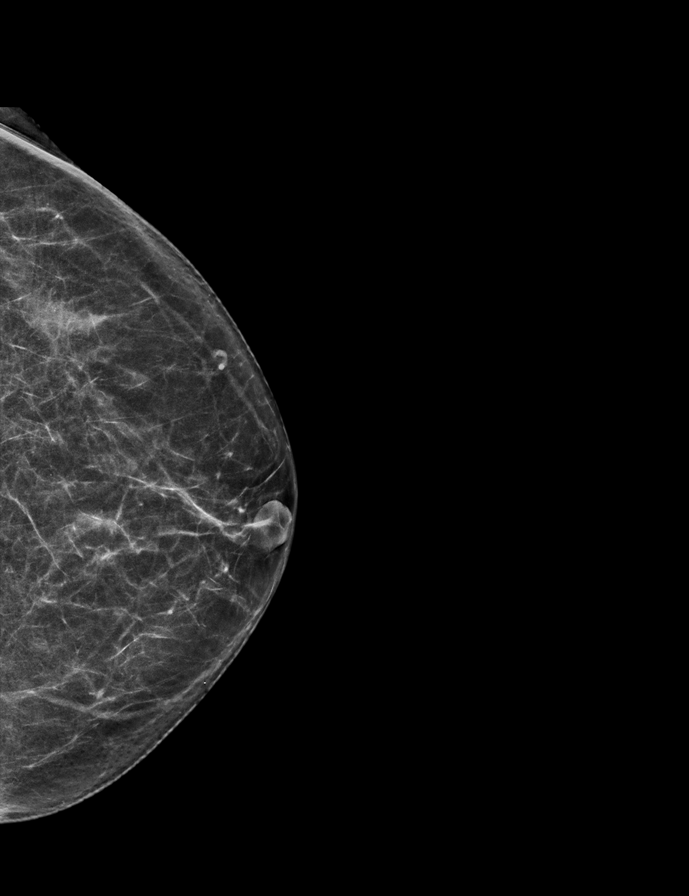

[R MLO synth-2D]
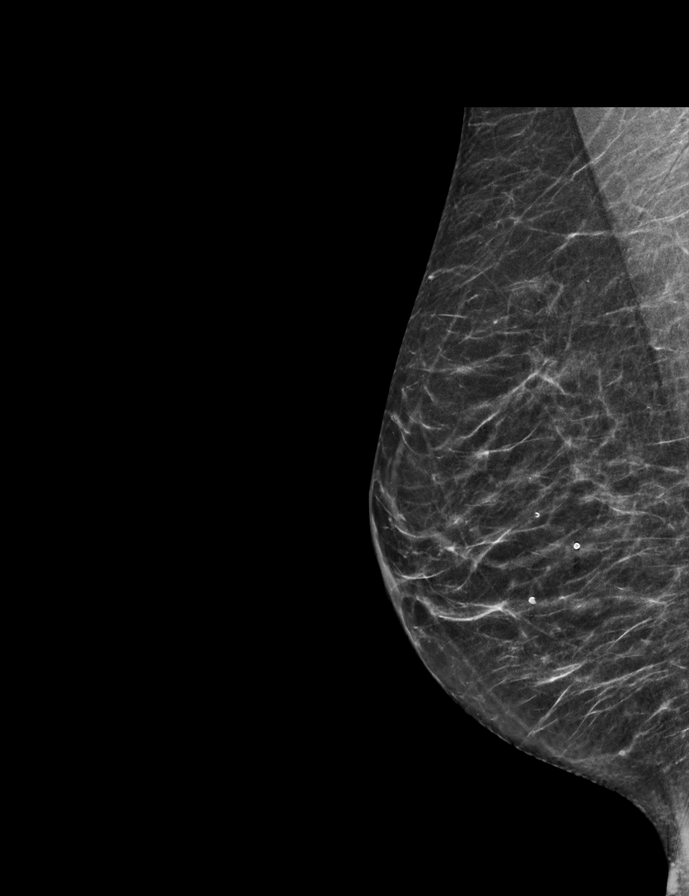

[L MLO synth-2D]
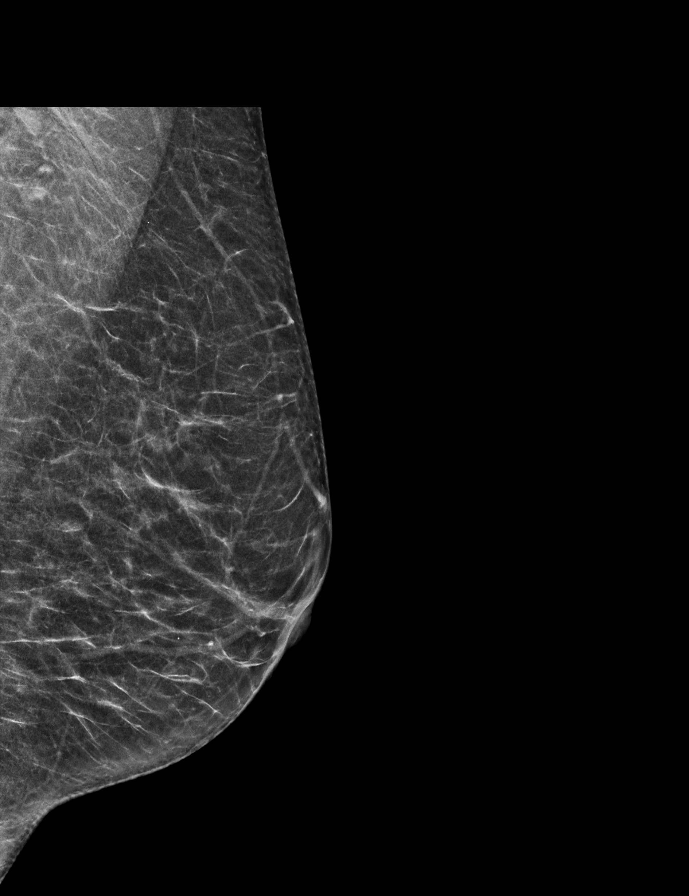

[R CC synth-2D]
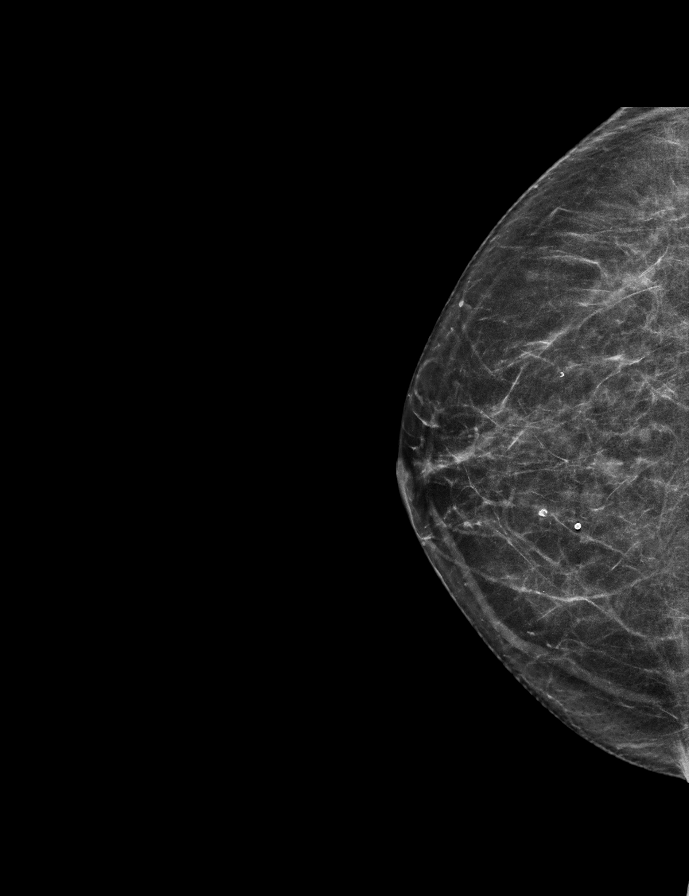

[R MLO tomo · 2 of 51 frames shown]
[frame 17/51]
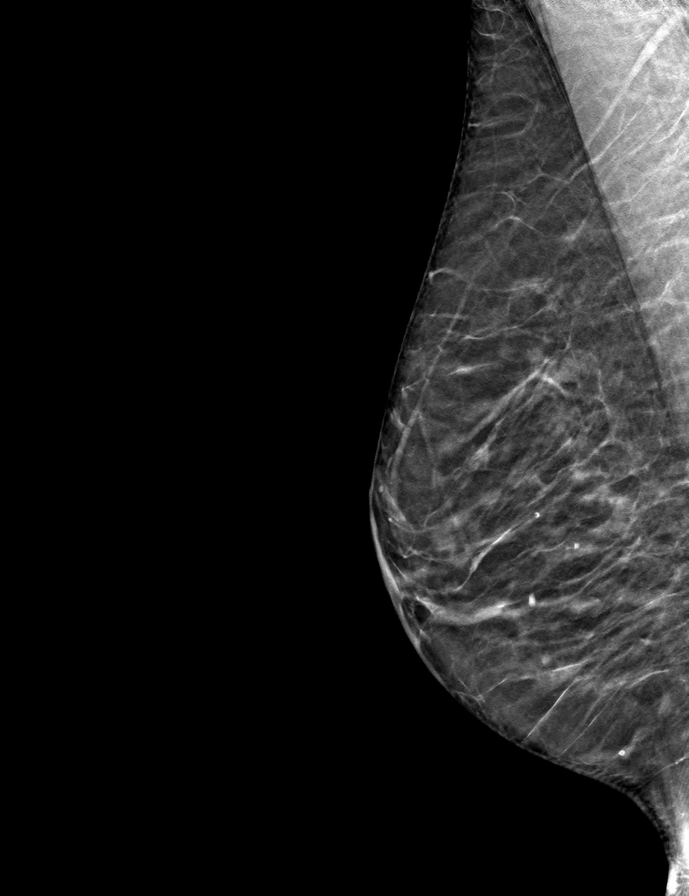
[frame 26/51]
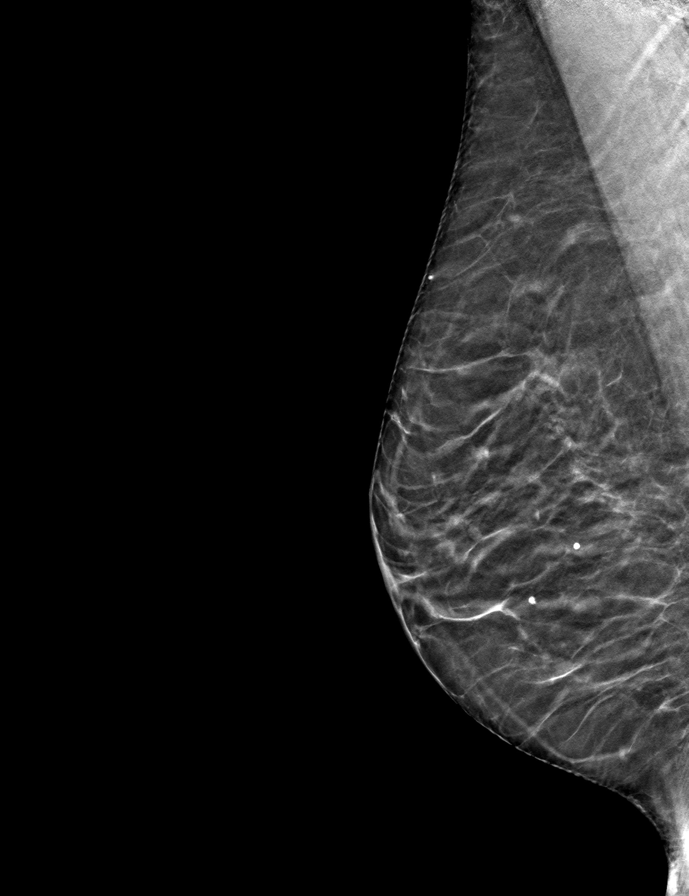

[R CC tomo · tomo slice 29/56.0]
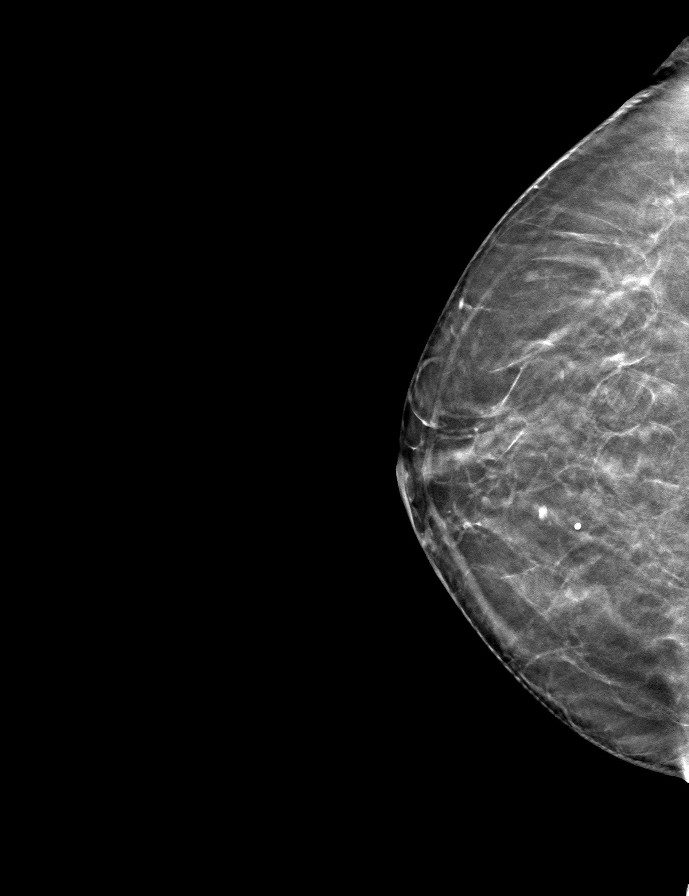

[L MLO tomo · tomo slice 27/52.0]
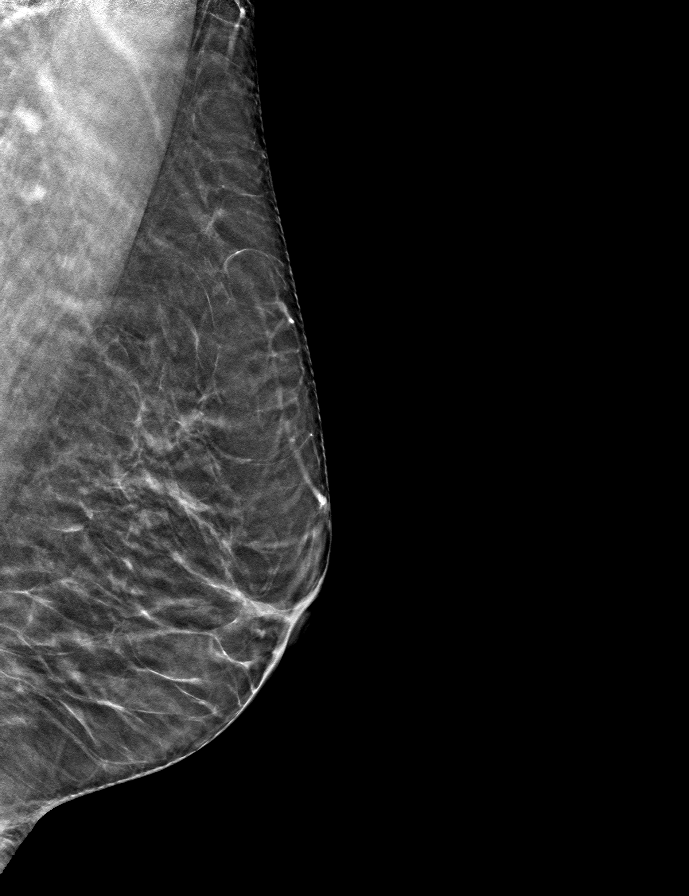

[L CC tomo · tomo slice 29/57.0]
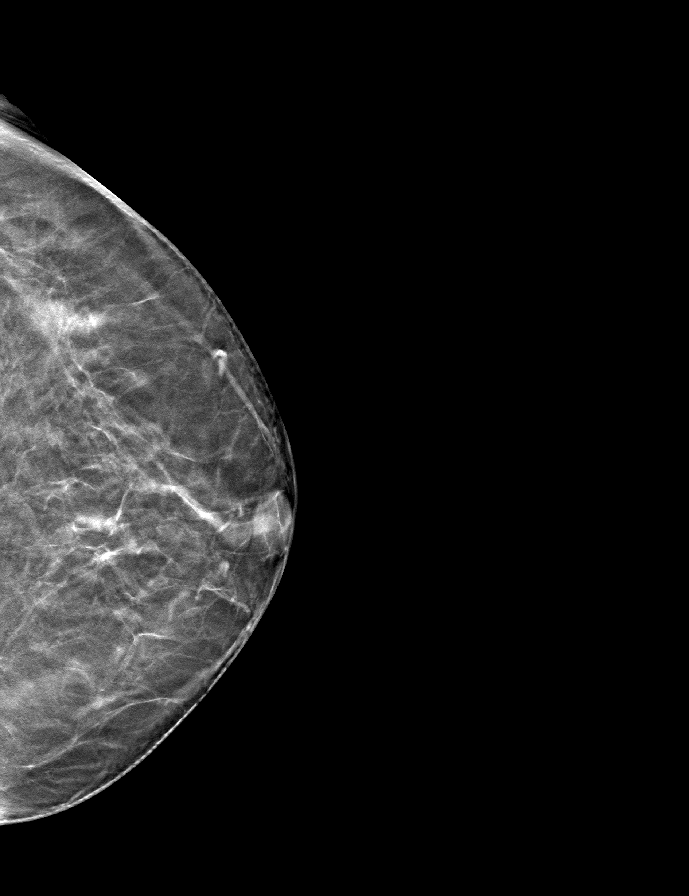

[9 of 24 positions shown; findings below may reference images not displayed]

ACR Breast Density Category b: There are scattered areas of
fibroglandular density.
FINDINGS: There are no findings suspicious for malignancy. The images were
evaluated with computer-aided detection.
IMPRESSION: No mammographic evidence of malignancy. A result letter of this
screening mammogram will be mailed directly to the patient.

RECOMMENDATION:
Screening mammogram in one year. (Code:WJ-I-BG6)

BI-RADS CATEGORY  1: Negative.

## 2021-10-24 NOTE — Telephone Encounter (Signed)
Prior auth required for PROLIA  PA PROCESS DETAILS: PA is required. Complete the PA form & fax with clinical notes to Medical Management at [phone]. [email or web address]

## 2021-10-25 ENCOUNTER — Other Ambulatory Visit: Payer: Self-pay | Admitting: Family Medicine

## 2021-10-25 DIAGNOSIS — L812 Freckles: Secondary | ICD-10-CM | POA: Diagnosis not present

## 2021-10-25 DIAGNOSIS — D1801 Hemangioma of skin and subcutaneous tissue: Secondary | ICD-10-CM | POA: Diagnosis not present

## 2021-10-25 DIAGNOSIS — L820 Inflamed seborrheic keratosis: Secondary | ICD-10-CM | POA: Diagnosis not present

## 2021-10-25 DIAGNOSIS — L821 Other seborrheic keratosis: Secondary | ICD-10-CM | POA: Diagnosis not present

## 2021-10-26 ENCOUNTER — Other Ambulatory Visit: Payer: Self-pay

## 2021-10-26 MED ORDER — CELECOXIB 100 MG PO CAPS: 100.0000 mg | ORAL_CAPSULE | Freq: Two times a day (BID) | ORAL | 0 refills | Status: DC

## 2021-11-03 ENCOUNTER — Ambulatory Visit: Payer: BC Managed Care – PPO | Admitting: Podiatry

## 2021-11-03 ENCOUNTER — Encounter: Payer: Self-pay | Admitting: Podiatry

## 2021-11-03 ENCOUNTER — Ambulatory Visit (INDEPENDENT_AMBULATORY_CARE_PROVIDER_SITE_OTHER): Payer: BC Managed Care – PPO

## 2021-11-03 DIAGNOSIS — M779 Enthesopathy, unspecified: Secondary | ICD-10-CM

## 2021-11-03 MED ORDER — TRIAMCINOLONE ACETONIDE 10 MG/ML IJ SUSP
10.0000 mg | Freq: Once | INTRAMUSCULAR | Status: AC
  Administered 2021-11-03: 10 mg

## 2021-11-03 NOTE — Progress Notes (Signed)
Subjective:   Patient ID: [name], female   DOB: 66 y.o.   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  Reappoint as symptoms indicate  X-rays indicate there is some spurring of the dorsal area but I did not note any advanced arthritic condition

## 2021-11-08 ENCOUNTER — Encounter: Payer: Self-pay | Admitting: Internal Medicine

## 2021-11-08 MED ORDER — GABAPENTIN 300 MG PO CAPS: 300.0000 mg | ORAL_CAPSULE | Freq: Every day | ORAL | 3 refills | Status: DC

## 2021-11-08 MED ORDER — CELECOXIB 100 MG PO CAPS: 100.0000 mg | ORAL_CAPSULE | Freq: Two times a day (BID) | ORAL | 3 refills | Status: DC

## 2021-11-09 ENCOUNTER — Other Ambulatory Visit: Payer: Self-pay | Admitting: Family Medicine

## 2021-11-12 ENCOUNTER — Other Ambulatory Visit: Payer: Self-pay | Admitting: Family Medicine

## 2021-11-12 NOTE — Telephone Encounter (Signed)
Prior Authorization initiated for [employer] via [employer] ID: [id number]

## 2021-11-19 ENCOUNTER — Other Ambulatory Visit: Payer: Self-pay | Admitting: Internal Medicine

## 2021-11-28 ENCOUNTER — Encounter: Payer: Self-pay | Admitting: Internal Medicine

## 2021-12-13 NOTE — Telephone Encounter (Signed)
Pt archived in [email or web address].  Please advise if patient and/or provider wish to proceed with Prolia therpay.    [name] to [name] (supporting [name], MD)       07/25/21 10:47 AM Hi Dr. Cruzita Lederer,   I want to thank you for seeing me these past couple of years and to explain why I have canceled my upcoming appointment with you.  I came to you initially because I wanted to confirm that with my CPPD it was safe to receive the Prolia injections that my gynecologist, Dr. [name], recommended.  I have realized that you answered that question for me and with a number of injections now under my belt with no issues, I don't believe that I require your expertise on this anymore.  Dr. [name] office is going to give me the injections going forward.  This is really just me trying to consolidate my medical care and save your specialty for if I should need it down the road.  Thank you so very much for the care you have given me.  You and your staff have helped me a lot--it's evident in my improving bone density numbers!  Take care, and again, I really appreciate all you have done for me.     [name]

## 2022-02-02 ENCOUNTER — Ambulatory Visit (INDEPENDENT_AMBULATORY_CARE_PROVIDER_SITE_OTHER): Payer: BC Managed Care – PPO | Admitting: Internal Medicine

## 2022-02-02 ENCOUNTER — Encounter: Payer: Self-pay | Admitting: Internal Medicine

## 2022-02-02 VITALS — BP 122/80 | HR 80 | Ht 63.5 in | Wt 148.0 lb

## 2022-02-02 DIAGNOSIS — Z8639 Personal history of other endocrine, nutritional and metabolic disease: Secondary | ICD-10-CM

## 2022-02-02 DIAGNOSIS — M810 Age-related osteoporosis without current pathological fracture: Secondary | ICD-10-CM

## 2022-02-02 LAB — BASIC METABOLIC PANEL
BUN: 20 mg/dL (ref 6–23)
CO2: 28 mEq/L (ref 19–32)
Calcium: 9.7 mg/dL (ref 8.4–10.5)
Chloride: 104 mEq/L (ref 96–112)
Creatinine, Ser: 0.95 mg/dL (ref 0.40–1.20)
GFR: 63.28 mL/min (ref >60.00)
Glucose, Bld: 91 mg/dL (ref 70–99)
Potassium: 4.4 mEq/L (ref 3.5–5.1)
Sodium: 140 mEq/L (ref 135–145)

## 2022-02-02 LAB — VITAMIN D 25 HYDROXY (VIT D DEFICIENCY, FRACTURES): VITD: 49.3 ng/mL (ref 30.00–100.00)

## 2022-02-02 MED ORDER — ALENDRONATE SODIUM 70 MG PO TABS: 70.0000 mg | ORAL_TABLET | ORAL | 3 refills | Status: DC

## 2022-02-02 NOTE — Patient Instructions (Addendum)
Please stop at the lab.  Continue vitamin D 3000 units.  Let's restart  Fosamax 70 mg weekly.   Please send me the results of your latest DXA scan ([facility]).  Please come back for a follow-up appointment in 1 year.

## 2022-02-02 NOTE — Progress Notes (Signed)
Patient ID: [name], female   DOB: 06-05-1956, 66 y.o.   MRN: [id number]   HPI  [name] is a 66 y.o.-year-old femalemale, returning for follow-up for osteoporosis.  Last visit 1.5 years ago.  Interim history: No falls or fractures since last visit. No dizziness/orthostasis/vision problems. She was unable to get Prolia in a timely fashion since last visit.  Last injection was 8 months ago.  She was diagnosed with osteoporosis in 2019.  Reviewed her previous DXA scan results: Date L1-L4 T score FN T score 33% distal Radius  05/09/2021 ([facility]) -1.8 RFN: -0.7 LFN: -0.8 N/a  05/09/2019 ([facility]) -2.2 RFN: -1.2 LFN: -0.7 n/a  03/26/2017 ([facility]) -2.8 RFN: -1.1 LFN: -0.8 n/a  06/25/2012 ([facility]) -1.8 LFN: -0.8 n/a   Osteoporosis treatment history: - Fosamax x4 weeks >> abdominal pain - Prolia injections- 03/06/2018, 09/17/2018, 03/20/2019, 10/15/2019, 04/20/2020, 06/02/2021  She has a history of vitamin D deficiency: Lab Results  Component Value Date   VD25OH 51.0 08/12/2020   VD25OH 48.13 08/08/2019   VD25OH 41 03/18/2019   VD25OH 56.60 08/08/2018   VD25OH 54.19 08/07/2017   VD25OH 40 03/06/2017   VD25OH 38 12/16/2015   VD25OH 36 03/25/2012   VD25OH 25 (L) 01/20/2011   She is on 3000 units vitamin D daily.  She continues weightbearing exercises: prev. 30 minutes 3 times a week with a personal trainer >> now only walking and occasional weights.  She is not taking high vitamin A doses.  She has a history of CPPD >> now off Plaquenil; on Gabapentin (helps) + Celebrex.  She had many steroid injections over the years.  Menopause was in late 39s.  Pt does have a FH of osteoporosis in mother (many fractures).  She has a distant history of kidney stones x1 and no history of hyper or hypocalcemia or hyperparathyroidism:  Lab Results  Component Value Date   PTH 14 11/14/2019   CALCIUM 9.3 06/16/2021   CALCIUM 9.8 08/12/2020   CALCIUM 9.6 11/14/2019   CALCIUM 9.2 08/08/2019    CALCIUM 9.4 03/18/2019   CALCIUM 9.6 08/08/2018   CALCIUM 10.2 08/07/2017   CALCIUM 9.3 03/06/2017   CALCIUM 9.1 12/16/2015   CALCIUM 9.4 07/20/2013   No thyrotoxicosis: Lab Results  Component Value Date   TSH 1.73 06/16/2021   TSH 1.65 03/18/2019   TSH 0.97 03/06/2017   TSH 1.51 12/16/2015   TSH 2.172 03/25/2012   No CKD. Last BUN/Cr: Lab Results  Component Value Date   BUN 11 06/16/2021   CREATININE 0.79 06/16/2021   She has a h/o cervical spine fusion sx 2005.   ROS: + see HPI  I reviewed pt's medications, allergies, PMH, social hx, family hx, and changes were documented in the history of present illness. Otherwise, unchanged from my initial visit note.  Past Medical History:  Diagnosis Date   Allergy    Arthritis    Complication of anesthesia    H/O calcium pyrophosphate deposition disease (CPPD)    Migraine    PONV (postoperative nausea and vomiting)    Past Surgical History:  Procedure Laterality Date   BUNIONECTOMY Left    CERVICAL DISCECTOMY     C3-4   EYE SURGERY Bilateral    LASIK   JOINT REPLACEMENT Bilateral    knees   KNEE ARTHROSCOPY Left    KNEE ARTHROSCOPY Right 05/14/2013   Procedure: RIGHT ARTHROSCOPY KNEE;  Surgeon: Gearlean Alf, MD;  Location: [facility];  Service: Orthopedics;  Laterality: Right;  Social History   Socioeconomic History   Marital status: Married    Spouse name: Not on file   Number of children: Not on file   Years of education: Not on file   Highest education level: Not on file  Occupational History   Not on file  Tobacco Use   Smoking status: Never   Smokeless tobacco: Never  Substance and Sexual Activity   Alcohol use: Yes    Comment: socially   Drug use: No   Sexual activity: Not on file  Other Topics Concern   Not on file  Social History Narrative   Not on file   Social Determinants of Health   Financial Resource Strain: Not on file  Food Insecurity: Not on file  Transportation Needs: Not on file   Physical Activity: Not on file  Stress: Not on file  Social Connections: Not on file  Intimate Partner Violence: Not on file   Current Outpatient Medications on File Prior to Visit  Medication Sig Dispense Refill   celecoxib (CELEBREX) 100 MG capsule Take 1 capsule (100 mg total) by mouth 2 (two) times daily. 180 capsule 3   Cholecalciferol (VITAMIN D) 2000 UNITS CAPS Take 2,000 Units by mouth daily.     denosumab (PROLIA) 60 MG/ML SOSY injection Inject 60 mg into the skin every 6 (six) months. 1 mL 1   gabapentin (NEURONTIN) 300 MG capsule TAKE 1 CAPSULE BY MOUTH AT  BEDTIME 90 capsule 3   melatonin 5 MG TABS Take by mouth.     Multiple Vitamins-Minerals (CENTRUM ULTRA WOMENS) TABS Take by mouth.     rosuvastatin (CRESTOR) 5 MG tablet TAKE 1 TABLET BY MOUTH DAILY 90 tablet 3   No current facility-administered medications on file prior to visit.   No Known Allergies Family History  Problem Relation Age of Onset   Heart disease Mother    Breast cancer Neg Hx    PE: BP 122/80 (BP Location: Left Arm, Patient Position: Sitting, Cuff Size: Small)   Pulse 80   Ht 5' 3.5" (1.613 m)   Wt 148 lb (67.1 kg)   SpO2 98%   BMI 25.81 kg/m  Wt Readings from Last 3 Encounters:  02/02/22 148 lb (67.1 kg)  09/20/21 150 lb 8 oz (68.3 kg)  06/20/21 150 lb (68 kg)   Constitutional: normal weight, in NAD Eyes: no exophthalmos ENT: moist mucous membranes, no masses palpated in neck, no cervical lymphadenopathy Cardiovascular: RRR, No MRG Respiratory: CTA B Musculoskeletal: no deformities Skin: moist, warm, no rashes Neurological: no tremor with outstretched hands  Assessment: 1. Osteoporosis  2.  History of vitamin D deficiency  Plan: 1. Osteoporosis  -Likely age-related/postmenopausal, but also possibly contributed to by her steroid injections.  She also has family history of osteoporosis. -Latest bone density scan as above for review is from 05/09/2019.  This showed improvement in the  spine BMD (T score improved from -2.8 to -2.2) and stability in the femoral neck scores.  This is a good result.  At that time, I advised her to continue Prolia. -She was initially reticent to start Prolia due to her history of pseudogout and she tried Fosamax for 4 weeks, but this gave her abdominal pain so we ended up starting Prolia in 03/2018.  She was tolerating Prolia well, without hip/thigh/jaw pain. -Unfortunately, since last visit, it was difficult for her to obtain Prolia in time.  She only had 1 injection, in 05/2021.  She wanted to switch getting the injections  from her OB/GYN doctor, but she had problems obtaining it from there, also.  At today's visit, we discussed that delaying Prolia by more than 1 month after the next injection will be scheduled needs to deterioration of her bone density and an increased risk for fracture.  She initially had many problems with her insurance which led to delays in getting the Prolia, at this visit, I suggested to start back on Fosamax.  She did have abdominal pain with this in the past but she would like to try it again.  I advised her to start his right away and to try to avoid falling since she is now 1-1.5 month overdue for the Prolia injection and at higher risk for fractures. -We discussed that in the future, especially as she is preparing to switch to Medicare, we may go back to Prolia -For now, we discussed about maintaining a good amount of calcium and protein in her diet and we will check her vitamin D again to make sure that her supplement is adequate. -Also, continue exercise-we did discuss that she needs more weightbearing sizes.  Since she is mostly walking  for exercise, I advised her to add wrist weights or back back and also to try to walk after meals, rather than before as this has been documented to help bone strengthening. -At today's visit, we will recheck a BMP and a vitamin D level -She is had another bone density scan in 05/2021 at  [facility]. She will send me the records. - I we will see her back in a year  2.  History of vitamin D deficiency -Latest vitamin D level was reviewed from last visit and this was normal -She continues on 3000 units total vitamin D per day -We will recheck her vitamin D level now Component     Latest Ref Rng 02/02/2022  Glucose     70 - 99 mg/dL 91   BUN     6 - 23 mg/dL 20   Creatinine     0.40 - 1.20 mg/dL 0.95   Sodium     135 - 145 mEq/L 140   Potassium     3.5 - 5.1 mEq/L 4.4   Chloride     96 - 112 mEq/L 104   CO2     19 - 32 mEq/L 28   Calcium     8.4 - 10.5 mg/dL 9.7   VITD     30.00 - 100.00 ng/mL 49.30   GFR     >60.00 mL/min 63.28    Normal labs.  Addendum: Patient sent me the report of her most recent DXA scan:   The T-scores are better in the right femoral neck and AP spine.  Patient mentions that she will pay out-of-pocket for Prolia if needed.  However, for now, especially due to the improvement in her T-scores, I would suggest to continue with alendronate.  [name], MD PhD [facility] Endocrinology

## 2022-03-10 ENCOUNTER — Ambulatory Visit: Payer: BC Managed Care – PPO | Admitting: Internal Medicine

## 2022-03-13 ENCOUNTER — Ambulatory Visit: Payer: BC Managed Care – PPO | Admitting: Podiatry

## 2022-03-13 ENCOUNTER — Encounter: Payer: Self-pay | Admitting: Podiatry

## 2022-03-13 DIAGNOSIS — M778 Other enthesopathies, not elsewhere classified: Secondary | ICD-10-CM | POA: Diagnosis not present

## 2022-03-13 MED ORDER — TRIAMCINOLONE ACETONIDE 10 MG/ML IJ SUSP
10.0000 mg | Freq: Once | INTRAMUSCULAR | Status: AC
  Administered 2022-03-13: 10 mg

## 2022-03-14 ENCOUNTER — Encounter: Payer: Self-pay | Admitting: Podiatry

## 2022-04-03 NOTE — Progress Notes (Signed)
Subjective:   Patient ID: [name], female   DOB: 66 y.o.   MRN: [id number]   HPI Patient states she started develop pain again on the top of her left foot and states that it did do well for several months and then started again   ROS      Objective:  Physical Exam  Neurovascular status intact with pain discomfort on the dorsum of the left foot with inflammation of the midtarsal joint     Assessment:  Extensor tendinitis left     Plan:  Discussed there may be arthritis and ultimately may require CT scan and MRI but we will try again conservatively its been over 4 months and I did do sterile prep injected the extensor complex 3 mg Dexasone Kenalog 5 mg Xylocaine advised on topical diclofenac and reappoint as needed

## 2022-04-10 ENCOUNTER — Ambulatory Visit
Admission: RE | Admit: 2022-04-10 | Discharge: 2022-04-10 | Disposition: A | Discharge status: Home / Self Care | Payer: BC Managed Care – PPO | Source: Ambulatory Visit | Attending: Obstetrics & Gynecology | Admitting: Obstetrics & Gynecology

## 2022-04-10 ENCOUNTER — Other Ambulatory Visit: Payer: Self-pay | Admitting: Obstetrics & Gynecology

## 2022-04-10 DIAGNOSIS — N6311 Unspecified lump in the right breast, upper outer quadrant: Secondary | ICD-10-CM | POA: Diagnosis not present

## 2022-04-10 DIAGNOSIS — R928 Other abnormal and inconclusive findings on diagnostic imaging of breast: Secondary | ICD-10-CM

## 2022-04-10 DIAGNOSIS — R92321 Mammographic fibroglandular density, right breast: Secondary | ICD-10-CM | POA: Diagnosis not present

## 2022-04-10 DIAGNOSIS — N6312 Unspecified lump in the right breast, upper inner quadrant: Secondary | ICD-10-CM | POA: Diagnosis not present

## 2022-04-10 DIAGNOSIS — N6313 Unspecified lump in the right breast, lower outer quadrant: Secondary | ICD-10-CM | POA: Diagnosis not present

## 2022-04-20 ENCOUNTER — Ambulatory Visit
Admission: RE | Admit: 2022-04-20 | Discharge: 2022-04-20 | Disposition: A | Discharge status: Home / Self Care | Payer: BC Managed Care – PPO | Source: Ambulatory Visit | Attending: Obstetrics & Gynecology | Admitting: Obstetrics & Gynecology

## 2022-04-20 ENCOUNTER — Other Ambulatory Visit: Payer: Self-pay

## 2022-04-20 DIAGNOSIS — N6311 Unspecified lump in the right breast, upper outer quadrant: Secondary | ICD-10-CM | POA: Diagnosis not present

## 2022-04-20 DIAGNOSIS — R928 Other abnormal and inconclusive findings on diagnostic imaging of breast: Secondary | ICD-10-CM

## 2022-04-20 DIAGNOSIS — N6011 Diffuse cystic mastopathy of right breast: Secondary | ICD-10-CM | POA: Diagnosis not present

## 2022-04-20 HISTORY — PX: BREAST BIOPSY: SHX20

## 2022-04-20 MED ORDER — ALENDRONATE SODIUM 70 MG PO TABS: 70.0000 mg | ORAL_TABLET | ORAL | 3 refills | Status: DC

## 2022-05-05 ENCOUNTER — Encounter: Payer: Self-pay | Admitting: Podiatry

## 2022-05-08 ENCOUNTER — Telehealth: Payer: Self-pay | Admitting: Podiatry

## 2022-05-08 NOTE — Telephone Encounter (Signed)
Left message for pt to call to schedule an appt with Dr Paulla Dolly.

## 2022-05-09 ENCOUNTER — Telehealth: Payer: Self-pay | Admitting: Podiatry

## 2022-05-09 NOTE — Telephone Encounter (Signed)
Left message for pt to call to schedule an appt.

## 2022-05-24 ENCOUNTER — Encounter: Payer: Self-pay | Admitting: Podiatry

## 2022-05-24 ENCOUNTER — Ambulatory Visit: Payer: BC Managed Care – PPO | Admitting: Podiatry

## 2022-05-24 ENCOUNTER — Ambulatory Visit (INDEPENDENT_AMBULATORY_CARE_PROVIDER_SITE_OTHER): Payer: BC Managed Care – PPO

## 2022-05-24 DIAGNOSIS — M779 Enthesopathy, unspecified: Secondary | ICD-10-CM

## 2022-05-24 MED ORDER — TRIAMCINOLONE ACETONIDE 10 MG/ML IJ SUSP
10.0000 mg | Freq: Once | INTRAMUSCULAR | Status: AC
  Administered 2022-05-24: 10 mg

## 2022-05-24 NOTE — Progress Notes (Signed)
Subjective:   Patient ID: [name], female   DOB: 66 y.o.   MRN: [id number]   HPI Patient states the last injection only lasted 2 months and there is one area that is very pinpoint sore   ROS      Objective:  Physical Exam  Neurovascular status intact with inflammation which appears to be mostly at the metatarsal cuneiform joint second and third metatarsal with no other pain negative for neurological symptoms does not appear to tingle burn appears to be more inflammatory     Assessment:  Cannot rule out there may not be some form of joint pathology or bony pathology associated with possible inflammatory tendinous condition     Plan:  H&P reviewed condition and I am good to do 1 more injection today explaining procedure and risk.  I went ahead did sterile prep injected the dorsal tendon complex 3 mg dexamethasone Kenalog 5 mg Xylocaine I am goingtogoaheadandstartatopical to try to help with the inflammation and I discussed MRI if symptoms persist and she is to let me know.   X-rays do indicate suspicion around the joint surfaces but difficult to make complete determination from conventional x-rays

## 2022-06-20 ENCOUNTER — Other Ambulatory Visit: Payer: BC Managed Care – PPO

## 2022-06-20 DIAGNOSIS — Z136 Encounter for screening for cardiovascular disorders: Secondary | ICD-10-CM | POA: Diagnosis not present

## 2022-06-20 DIAGNOSIS — E7800 Pure hypercholesterolemia, unspecified: Secondary | ICD-10-CM

## 2022-06-20 DIAGNOSIS — R5383 Other fatigue: Secondary | ICD-10-CM

## 2022-06-21 ENCOUNTER — Ambulatory Visit (INDEPENDENT_AMBULATORY_CARE_PROVIDER_SITE_OTHER): Payer: BC Managed Care – PPO | Admitting: Internal Medicine

## 2022-06-21 ENCOUNTER — Encounter: Payer: Self-pay | Admitting: Internal Medicine

## 2022-06-21 VITALS — BP 120/82 | HR 71 | Temp 98.1°F | Ht 63.5 in | Wt 150.4 lb

## 2022-06-21 DIAGNOSIS — N6081 Other benign mammary dysplasias of right breast: Secondary | ICD-10-CM

## 2022-06-21 DIAGNOSIS — M8580 Other specified disorders of bone density and structure, unspecified site: Secondary | ICD-10-CM | POA: Diagnosis not present

## 2022-06-21 DIAGNOSIS — E7800 Pure hypercholesterolemia, unspecified: Secondary | ICD-10-CM | POA: Diagnosis not present

## 2022-06-21 DIAGNOSIS — J301 Allergic rhinitis due to pollen: Secondary | ICD-10-CM

## 2022-06-21 DIAGNOSIS — G44209 Tension-type headache, unspecified, not intractable: Secondary | ICD-10-CM

## 2022-06-21 DIAGNOSIS — Z8739 Personal history of other diseases of the musculoskeletal system and connective tissue: Secondary | ICD-10-CM | POA: Diagnosis not present

## 2022-06-21 DIAGNOSIS — Z0000 Encounter for general adult medical examination without abnormal findings: Secondary | ICD-10-CM | POA: Diagnosis not present

## 2022-06-21 DIAGNOSIS — R7989 Other specified abnormal findings of blood chemistry: Secondary | ICD-10-CM

## 2022-06-21 LAB — COMPLETE METABOLIC PANEL WITH GFR
AG Ratio: 1.4 (calc) (ref 1.0–2.5)
ALT: 21 U/L (ref 6–29)
AST: 25 U/L (ref 10–35)
Albumin: 4.2 g/dL (ref 3.6–5.1)
Alkaline phosphatase (APISO): 94 U/L (ref 37–153)
BUN: 10 mg/dL (ref 7–25)
CO2: 31 mmol/L (ref 20–32)
Calcium: 9.5 mg/dL (ref 8.6–10.4)
Chloride: 104 mmol/L (ref 98–110)
Creat: 0.77 mg/dL (ref 0.50–1.05)
Globulin: 2.9 g/dL (calc) (ref 1.9–3.7)
Glucose, Bld: 86 mg/dL (ref 65–99)
Potassium: 4.2 mmol/L (ref 3.5–5.3)
Sodium: 141 mmol/L (ref 135–146)
Total Bilirubin: 0.5 mg/dL (ref 0.2–1.2)
Total Protein: 7.1 g/dL (ref 6.1–8.1)
eGFR: 86 mL/min/{1.73_m2} (ref >=60)

## 2022-06-21 LAB — CBC WITH DIFFERENTIAL/PLATELET
Absolute Monocytes: 342 cells/uL (ref 200–950)
Basophils Absolute: 70 cells/uL (ref 0–200)
Basophils Relative: 2.2 %
Eosinophils Absolute: 90 cells/uL (ref 15–500)
Eosinophils Relative: 2.8 %
HCT: 42.9 % (ref 35.0–45.0)
Hemoglobin: 14.1 g/dL (ref 11.7–15.5)
Lymphs Abs: 1667 cells/uL (ref 850–3900)
MCH: 30.7 pg (ref 27.0–33.0)
MCHC: 32.9 g/dL (ref 32.0–36.0)
MCV: 93.3 fL (ref 80.0–100.0)
MPV: 9.6 fL (ref 7.5–12.5)
Monocytes Relative: 10.7 %
Neutro Abs: 1030 cells/uL — ABNORMAL LOW (ref 1500–7800)
Neutrophils Relative %: 32.2 %
Platelets: 295 10*3/uL (ref 140–400)
RBC: 4.6 10*6/uL (ref 3.80–5.10)
RDW: 12.1 % (ref 11.0–15.0)
Total Lymphocyte: 52.1 %
WBC: 3.2 10*3/uL — ABNORMAL LOW (ref 3.8–10.8)

## 2022-06-21 LAB — POCT URINALYSIS DIPSTICK
Bilirubin, UA: NEGATIVE
Blood, UA: NEGATIVE
Glucose, UA: NEGATIVE
Ketones, UA: NEGATIVE
Leukocytes, UA: NEGATIVE
Nitrite, UA: NEGATIVE
Protein, UA: NEGATIVE
Spec Grav, UA: 1.015 (ref 1.010–1.025)
Urobilinogen, UA: 0.2 E.U./dL
pH, UA: 5 (ref 5.0–8.0)

## 2022-06-21 LAB — LIPID PANEL
Cholesterol: 182 mg/dL (ref <200)
HDL: 94 mg/dL (ref >=50)
LDL Cholesterol (Calc): 70 mg/dL (calc)
Non-HDL Cholesterol (Calc): 88 mg/dL (calc) (ref <130)
Total CHOL/HDL Ratio: 1.9 (calc) (ref <5.0)
Triglycerides: 95 mg/dL (ref <150)

## 2022-06-21 LAB — TSH: TSH: 1.88 mIU/L (ref 0.40–4.50)

## 2022-06-21 NOTE — Progress Notes (Signed)
   Subjective:    Patient ID: [name], female    DOB: 1955-10-12, 67 y.o.   MRN: [id number]  HPI 67 year old Female seen for health maintenance exam and evaluation of medical issues.  She has a history of calcium pyrophosphate deposition disease (CPPD), migraine headaches, allergic rhinitis.  In 2012, she underwent bilateral knee arthroplasties.  History of allergic rhinitis and is allergic to grass, pollens and molds.  Some issues with muscle contraction headaches occasionally starting in her neck.  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  Had diagnostic mammography in November 2023 with right breast biopsy that showed fibrocystic changes with  apocrine metaplasia negative for malignancy and atypia.  Immunizations reviewed.  She takes annual flu vaccine.  Tetanus immunization is due.  Last COVID-vaccine on file September 2023.  Will be a candidate for pneumococcal 20 vaccine, is a candidate for RSV vaccine.  Has had Shingrix vaccines.  Had colonoscopy in 2019 with repeat study recommended in 2024 with [name].  History of osteopenia and has had Prolia injections with Endocrinologist.  Currently on Fosamax per [name].  Taking gabapentin per [name] at bedtime  Recently has had tendinitis issues and is seeing [name], Podiatrist.  Was found to have inflammation mostly at the metatarsal cuneiform joint second and third metatarsals.  Has received steroid injections with Xylocaine for this.  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  Husband is a Stage manager.  She has adult children, son and a daughter.  Non-smoker.  Social alcohol consumption.  Family history: Father died of aplastic anemia with history of CVA.  Mother with history of subependymoma of fourth ventricle.  Patient's sister died of lower extremity sarcoma at the age of 5.  Review of Systems denies chest pain, shortness of breath, abdominal pain      Objective:   Physical Exam Vital signs reviewed.  Blood pressure 120/82, pulse 71 regular, temperature 98.1 degrees, pulse oximetry 99% weight 150 pounds 6.4 ounces, BMI 26.22 Skin: Warm and dry.  No cervical adenopathy, thyromegaly or carotid bruits.  Chest clear.  Cardiac exam: Regular rate and rhythm.  Abdomen soft nondistended without hepatosplenomegaly masses or tenderness.  Pelvic exam deferred to GYN physician.  No lower extremity pitting edema.  Brief neurological exam intact without gross focal deficits.  Affect thought and judgment are normal.      Assessment & Plan:  Osteopenia currently being treated with Fosamax per her endocrinologist  CPPD currently receiving gabapentin and Celebrex  Hyperlipidemia treated with low-dose Crestor 5 mg daily and lipid panel is normal.  History of pure hypercholesterolemia.  History of muscle contraction headaches  History of apocrine metaplasia right breast  Follow-up in 1 year or as needed.

## 2022-06-22 ENCOUNTER — Encounter: Payer: BC Managed Care – PPO | Admitting: Internal Medicine

## 2022-07-31 DIAGNOSIS — Z01419 Encounter for gynecological examination (general) (routine) without abnormal findings: Secondary | ICD-10-CM | POA: Diagnosis not present

## 2022-07-31 DIAGNOSIS — R319 Hematuria, unspecified: Secondary | ICD-10-CM | POA: Diagnosis not present

## 2022-07-31 DIAGNOSIS — Z6825 Body mass index (BMI) 25.0-25.9, adult: Secondary | ICD-10-CM | POA: Diagnosis not present

## 2022-08-03 NOTE — Patient Instructions (Addendum)
It was a pleasure to see you today.  Continue current medications and return in 1 year or as needed.  Vaccines discussed.  Continue low dose rosuvastatin.

## 2022-08-18 ENCOUNTER — Ambulatory Visit: Payer: BC Managed Care – PPO | Admitting: Internal Medicine

## 2022-08-18 ENCOUNTER — Telehealth: Payer: Self-pay

## 2022-08-18 NOTE — Telephone Encounter (Signed)
scheduled

## 2022-08-18 NOTE — Telephone Encounter (Signed)
Patient called back at 2:15 to say her eye was feeling better and she does not feel like she still needs to come in, canceled appointment.

## 2022-08-18 NOTE — Telephone Encounter (Signed)
Patient called stating she has right eye pain and believes it could be conjunctivitis also stated she has had a cold or virus this week that started on Tuesday. I informed patient a message would be sent and someone would call her to schedule.

## 2022-09-05 ENCOUNTER — Other Ambulatory Visit: Payer: Self-pay | Admitting: Internal Medicine

## 2022-09-06 ENCOUNTER — Other Ambulatory Visit: Payer: Self-pay | Admitting: Obstetrics & Gynecology

## 2022-09-06 DIAGNOSIS — N63 Unspecified lump in unspecified breast: Secondary | ICD-10-CM

## 2022-09-12 DIAGNOSIS — H5711 Ocular pain, right eye: Secondary | ICD-10-CM | POA: Diagnosis not present

## 2022-09-12 DIAGNOSIS — H2000 Unspecified acute and subacute iridocyclitis: Secondary | ICD-10-CM | POA: Diagnosis not present

## 2022-09-26 DIAGNOSIS — H5711 Ocular pain, right eye: Secondary | ICD-10-CM | POA: Diagnosis not present

## 2022-09-26 DIAGNOSIS — R519 Headache, unspecified: Secondary | ICD-10-CM | POA: Diagnosis not present

## 2022-10-24 ENCOUNTER — Other Ambulatory Visit: Payer: Self-pay | Admitting: Obstetrics & Gynecology

## 2022-10-24 ENCOUNTER — Ambulatory Visit
Admission: RE | Admit: 2022-10-24 | Discharge: 2022-10-24 | Disposition: A | Discharge status: Home / Self Care | Payer: BC Managed Care – PPO | Source: Ambulatory Visit | Attending: Obstetrics & Gynecology | Admitting: Obstetrics & Gynecology

## 2022-10-24 DIAGNOSIS — N63 Unspecified lump in unspecified breast: Secondary | ICD-10-CM

## 2022-10-24 DIAGNOSIS — R92323 Mammographic fibroglandular density, bilateral breasts: Secondary | ICD-10-CM | POA: Diagnosis not present

## 2022-10-24 DIAGNOSIS — R92321 Mammographic fibroglandular density, right breast: Secondary | ICD-10-CM | POA: Diagnosis not present

## 2022-11-10 ENCOUNTER — Encounter: Payer: Self-pay | Admitting: Podiatry

## 2022-11-10 ENCOUNTER — Other Ambulatory Visit: Payer: Self-pay | Admitting: Podiatry

## 2022-11-10 ENCOUNTER — Ambulatory Visit (INDEPENDENT_AMBULATORY_CARE_PROVIDER_SITE_OTHER): Payer: BC Managed Care – PPO | Admitting: Podiatry

## 2022-11-10 ENCOUNTER — Ambulatory Visit (INDEPENDENT_AMBULATORY_CARE_PROVIDER_SITE_OTHER): Payer: BC Managed Care – PPO

## 2022-11-10 DIAGNOSIS — M7752 Other enthesopathy of left foot: Secondary | ICD-10-CM | POA: Diagnosis not present

## 2022-11-10 DIAGNOSIS — M19072 Primary osteoarthritis, left ankle and foot: Secondary | ICD-10-CM

## 2022-11-10 DIAGNOSIS — M779 Enthesopathy, unspecified: Secondary | ICD-10-CM | POA: Diagnosis not present

## 2022-11-10 MED ORDER — TRIAMCINOLONE ACETONIDE 10 MG/ML IJ SUSP
10.0000 mg | Freq: Once | INTRAMUSCULAR | Status: AC
  Administered 2022-11-10: 10 mg

## 2022-11-12 NOTE — Progress Notes (Signed)
Subjective:   Patient ID: [name], female   DOB: 67 y.o.   MRN: [id number]   HPI Patient states she has developed a lot of pain on the top of her left foot again and she thinks it is got a be arthritis or some other kind of condition of the joint surfaces   ROS      Objective:  Physical Exam  Neurovascular status intact exquisite discomfort around the base the second metatarsal cuneiform and third metatarsal cuneiform with inflammation fluid buildup     Assessment:  Probability for significant arthritis and history in the family of bone cancer which makes her very nervous     Plan:  Reviewed condition I did go ahead today due to the intensity of discomfort her getting ready to go out of town I injected 3 mg Dexasone Kenalog 5 mg Xylocaine and I also ordered MRI to see what the condition of the joint surfaces are.  Patient will be seen back when we get the results  X-rays do indicate suspicion around these joint surfaces for arthritis did not see any lytic pathology or other condition

## 2022-11-16 DIAGNOSIS — L820 Inflamed seborrheic keratosis: Secondary | ICD-10-CM | POA: Diagnosis not present

## 2022-11-16 DIAGNOSIS — L812 Freckles: Secondary | ICD-10-CM | POA: Diagnosis not present

## 2022-11-16 DIAGNOSIS — D225 Melanocytic nevi of trunk: Secondary | ICD-10-CM | POA: Diagnosis not present

## 2022-11-16 DIAGNOSIS — L821 Other seborrheic keratosis: Secondary | ICD-10-CM | POA: Diagnosis not present

## 2022-12-11 ENCOUNTER — Telehealth (INDEPENDENT_AMBULATORY_CARE_PROVIDER_SITE_OTHER): Payer: BC Managed Care – PPO | Admitting: Internal Medicine

## 2022-12-11 ENCOUNTER — Telehealth: Payer: Self-pay | Admitting: Internal Medicine

## 2022-12-11 ENCOUNTER — Encounter: Payer: Self-pay | Admitting: Internal Medicine

## 2022-12-11 DIAGNOSIS — U071 COVID-19: Secondary | ICD-10-CM | POA: Diagnosis not present

## 2022-12-11 MED ORDER — NIRMATRELVIR/RITONAVIR (PAXLOVID)TABLET: 3.0000 | ORAL_TABLET | Freq: Two times a day (BID) | ORAL | 0 refills | Status: AC

## 2022-12-11 NOTE — Patient Instructions (Addendum)
Paxlovid regular strength to take as directed.  Walk some to prevent atelectasis.Stay well-hydrated.  Quarantine for 5 days.

## 2022-12-11 NOTE — Progress Notes (Signed)
Patient Care Team: Margaree Mackintosh, MD as PCP - General (Internal Medicine) Pollyann Savoy, MD as Consulting Physician (Rheumatology) Ollen Gross, MD as Consulting Physician (Orthopedic Surgery) Shirlean Kelly, MD as Consulting Physician (Neurosurgery) Vida Rigger, MD as Consulting Physician (Gastroenterology)  I connected with [name] on 12/11/22 at 10:34 AM by video enabled telemedicine visit and verified that I am speaking with the correct person using two identifiers.   I discussed the limitations, risks, security and privacy concerns of performing an evaluation and management service by telemedicine and the availability of in-person appointments. I also discussed with the patient that there may be a patient responsible charge related to this service. The patient expressed understanding and agreed to proceed.   Other persons participating in the visit and their role in the encounter: Medical scribe, Doylene Bode  Patient's location: Home  Provider's location: Clinic   I provided 10 minutes of face-to-face video visit time during this encounter, and > 50% was spent counseling as documented under my assessment & plan. She is identified using two identifiers, [name], a patient of this practice. She is in her home and I am in my practice. She is agreeable to using this format today.  Chief Complaint: congestion  Subjective:    Patient ID: [name] , Female    DOB: 12/17/1955, 67 y.o.    MRN: [id number]   67 y.o. Female presents today for headache starting 12/09/22 followed by congestion, low-grade fever, rhinorrhea on 12/10/22. Positive at-home Covid-19 test 12/10/22.   Past Medical History:  Diagnosis Date   Allergy    Arthritis    Complication of anesthesia    H/O calcium pyrophosphate deposition disease (CPPD)    Migraine    PONV (postoperative nausea and vomiting)      Family History  Problem Relation Age of Onset   Heart disease Mother     Breast cancer Neg Hx     Social History   Social History Narrative   Not on file      Review of Systems  Constitutional:  Positive for fever (Low-grade). Negative for malaise/fatigue.  HENT:  Positive for congestion.        (+) Rhinorrhea  Eyes:  Negative for blurred vision.  Respiratory:  Negative for cough and shortness of breath.   Cardiovascular:  Negative for chest pain, palpitations and leg swelling.  Gastrointestinal:  Negative for vomiting.  Musculoskeletal:  Negative for back pain.  Skin:  Negative for rash.  Neurological:  Negative for loss of consciousness and headaches.        Objective:   Vitals: There were no vitals taken for this visit.   Physical Exam Vitals and nursing note reviewed.  Constitutional:      General: She is not in acute distress.    Appearance: Normal appearance. She is not toxic-appearing.  HENT:     Head: Normocephalic and atraumatic.  Pulmonary:     Effort: Pulmonary effort is normal.  Skin:    General: Skin is warm and dry.  Neurological:     Mental Status: She is alert and oriented to person, place, and time. Mental status is at baseline.  Psychiatric:        Mood and Affect: Mood normal.        Behavior: Behavior normal.        Thought Content: Thought content normal.        Judgment: Judgment normal.       Results:  Studies obtained and personally reviewed by me:    Labs:       Component Value Date/Time   NA 141 06/20/2022 0906   K 4.2 06/20/2022 0906   CL 104 06/20/2022 0906   CO2 31 06/20/2022 0906   GLUCOSE 86 06/20/2022 0906   BUN 10 06/20/2022 0906   CREATININE 0.77 06/20/2022 0906   CALCIUM 9.5 06/20/2022 0906   PROT 7.1 06/20/2022 0906   ALBUMIN 4.0 12/16/2015 1101   AST 25 06/20/2022 0906   ALT 21 06/20/2022 0906   ALKPHOS 73 12/16/2015 1101   BILITOT 0.5 06/20/2022 0906   GFRNONAA 87 03/18/2019 1207   GFRAA 101 03/18/2019 1207     Lab Results  Component Value Date   WBC 3.2 (L)  06/20/2022   HGB 14.1 06/20/2022   HCT 42.9 06/20/2022   MCV 93.3 06/20/2022   PLT 295 06/20/2022    Lab Results  Component Value Date   CHOL 182 06/20/2022   HDL 94 06/20/2022   LDLCALC 70 06/20/2022   TRIG 95 06/20/2022   CHOLHDL 1.9 06/20/2022    No results found for: "HGBA1C"   Lab Results  Component Value Date   TSH 1.88 06/20/2022      Assessment & Plan:   Acute Covid-19 infection: GFR at 86 on 06/20/22. Prescribed Paxlovid regular strength three tablets twice daily for 5 days. Stop Fosamax for the remainder of the week. Stay well hydrated. Contact us if symptoms do not improve.Monitor pulse oximetry. Try to walk some to prevent atelectasis.  Quarantine for 5 days.    I,Alexander Ruley,acting as a Neurosurgeon for Margaree Mackintosh, MD.,have documented all relevant documentation on the behalf of Margaree Mackintosh, MD,as directed by  Margaree Mackintosh, MD while in the presence of Margaree Mackintosh, MD.   I, Margaree Mackintosh, MD, have reviewed all documentation for this visit. The documentation on 12/11/22 for the exam, diagnosis, procedures, and orders are all accurate and complete.

## 2022-12-11 NOTE — Telephone Encounter (Signed)
[name] [phone]  [name] called to say she tested positive with COVID yesterday, she has a cough, head and chest congestion, runny nose, fever. I have scheduled her for video visit at 10:30

## 2022-12-16 ENCOUNTER — Other Ambulatory Visit: Payer: Self-pay | Admitting: Internal Medicine

## 2022-12-18 ENCOUNTER — Ambulatory Visit
Admission: RE | Admit: 2022-12-18 | Discharge: 2022-12-18 | Disposition: A | Discharge status: Home / Self Care | Payer: BC Managed Care – PPO | Source: Ambulatory Visit | Attending: Podiatry | Admitting: Podiatry

## 2022-12-18 DIAGNOSIS — M19072 Primary osteoarthritis, left ankle and foot: Secondary | ICD-10-CM | POA: Diagnosis not present

## 2022-12-18 DIAGNOSIS — M779 Enthesopathy, unspecified: Secondary | ICD-10-CM

## 2022-12-18 DIAGNOSIS — M79672 Pain in left foot: Secondary | ICD-10-CM | POA: Diagnosis not present

## 2022-12-20 NOTE — Progress Notes (Signed)
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 Has had this problem a long time.Probably will need fusion at one point. Any interest in seeing?

## 2023-01-25 ENCOUNTER — Other Ambulatory Visit: Payer: Self-pay | Admitting: Internal Medicine

## 2023-01-30 ENCOUNTER — Ambulatory Visit (INDEPENDENT_AMBULATORY_CARE_PROVIDER_SITE_OTHER): Payer: BC Managed Care – PPO | Admitting: Internal Medicine

## 2023-01-30 ENCOUNTER — Encounter: Payer: Self-pay | Admitting: Internal Medicine

## 2023-01-30 VITALS — BP 138/80 | HR 64 | Ht 63.5 in | Wt 148.0 lb

## 2023-01-30 DIAGNOSIS — M810 Age-related osteoporosis without current pathological fracture: Secondary | ICD-10-CM

## 2023-01-30 DIAGNOSIS — Z8639 Personal history of other endocrine, nutritional and metabolic disease: Secondary | ICD-10-CM | POA: Diagnosis not present

## 2023-01-30 LAB — VITAMIN D 25 HYDROXY (VIT D DEFICIENCY, FRACTURES): VITD: 42.88 ng/mL (ref 30.00–100.00)

## 2023-01-30 NOTE — Patient Instructions (Signed)
Please stop at the lab.  Continue vitamin D 3000 units.  Please continue Fosamax 70 mg weekly.   Please send me the results of your new DXA scan ([facility]) after 05/10/2023.  Please come back for a follow-up appointment in 1 year.

## 2023-01-30 NOTE — Progress Notes (Signed)
Patient ID: [name], female   DOB: December 06, 1955, 67 y.o.   MRN: [id number]   HPI  [name] is a 67 y.o.-year-old female, returning for follow-up for osteoporosis.  Last visit 1.5 years ago.  Interim history: No falls or fractures since last visit. No dizziness/orthostasis/vision problems. Before last visit, she was unable to get Prolia in a timely fashion, with the last injection 8 months prior to our appointment.  At that time we switched to Fosamax.  She continues on this. She will start Medicare 10/2023. Since last visit, she had COVID-19 12/11/2022.  This resolved. She also had a right breast biopsy in 04/2022 that showed fibrocystic changes with apocrine metaplasia, but negative for malignancy or atypia. She has tendinitis in her foot and has had several surgeries in the past and may need another surgery.  She gets steroid injections with pain relief for 2 months.  She was diagnosed with osteoporosis in 2019.  Reviewed her previous DXA scan results: Date L1-L4 T score FN T score 33% distal Radius  05/09/2021 ([facility]) -1.8 RFN: -0.7 LFN: -0.8 N/a  05/09/2019 ([facility]) -2.2 RFN: -1.2 LFN: -0.7 n/a  03/26/2017 ([facility]) -2.8 RFN: -1.1 LFN: -0.8 n/a  06/25/2012 ([facility]) -1.8 LFN: -0.8 n/a   Osteoporosis treatment history: - Fosamax x4 weeks >> abdominal pain - Prolia injections- 03/06/2018, 09/17/2018, 03/20/2019, 10/15/2019, 04/20/2020, 06/02/2021 -had coverage problem with her previous insurance - Fosamax 70 mg weekly - restarted 01/2022  She has a history of vitamin D deficiency -latest levels have been normal: Lab Results  Component Value Date   VD25OH 49.30 02/02/2022   VD25OH 51.0 08/12/2020   VD25OH 48.13 08/08/2019   VD25OH 41 03/18/2019   VD25OH 56.60 08/08/2018   VD25OH 54.19 08/07/2017   VD25OH 40 03/06/2017   VD25OH 38 12/16/2015   VD25OH 36 03/25/2012   VD25OH 25 (L) 01/20/2011   She is on 3000 units vitamin D daily.  She continues weightbearing exercises:  prev. 30 minutes 3 times a week with a personal trainer >> now only walking and occasional weights.  She is not taking high vitamin A doses.  She has a history of CPPD >> now off Plaquenil; on Gabapentin (helps) + Celebrex.  She had many steroid injections over the years.  Menopause was in late 70s.  Pt does have a FH of osteoporosis in mother (many fractures).  She has a distant history of kidney stones x1 and no history of hyper or hypocalcemia or hyperparathyroidism:  Lab Results  Component Value Date   PTH 14 11/14/2019   CALCIUM 9.5 06/20/2022   CALCIUM 9.7 02/02/2022   CALCIUM 9.3 06/16/2021   CALCIUM 9.8 08/12/2020   CALCIUM 9.6 11/14/2019   CALCIUM 9.2 08/08/2019   CALCIUM 9.4 03/18/2019   CALCIUM 9.6 08/08/2018   CALCIUM 10.2 08/07/2017   CALCIUM 9.3 03/06/2017   No thyrotoxicosis: Lab Results  Component Value Date   TSH 1.88 06/20/2022   TSH 1.73 06/16/2021   TSH 1.65 03/18/2019   TSH 0.97 03/06/2017   TSH 1.51 12/16/2015   No CKD. Last BUN/Cr: Lab Results  Component Value Date   BUN 10 06/20/2022   CREATININE 0.77 06/20/2022   She has a h/o cervical spine fusion sx 2005.   ROS: + see HPI  I reviewed pt's medications, allergies, PMH, social hx, family hx, and changes were documented in the history of present illness. Otherwise, unchanged from my initial visit note.  Past Medical History:  Diagnosis Date  Allergy    Arthritis    Complication of anesthesia    H/O calcium pyrophosphate deposition disease (CPPD)    Migraine    PONV (postoperative nausea and vomiting)    Past Surgical History:  Procedure Laterality Date   BREAST BIOPSY Right 04/20/2022   MM RT BREAST BX W LOC DEV 1ST LESION IMAGE BX SPEC STEREO GUIDE 04/20/2022 [facility]   BUNIONECTOMY Left    CERVICAL DISCECTOMY     C3-4   EYE SURGERY Bilateral    LASIK   JOINT REPLACEMENT Bilateral    knees   KNEE ARTHROSCOPY Left    KNEE ARTHROSCOPY Right 05/14/2013   Procedure:  RIGHT ARTHROSCOPY KNEE;  Surgeon: Loanne Drilling, MD;  Location: [facility];  Service: Orthopedics;  Laterality: Right;   Social History   Socioeconomic History   Marital status: Married    Spouse name: Not on file   Number of children: Not on file   Years of education: Not on file   Highest education level: Not on file  Occupational History   Not on file  Tobacco Use   Smoking status: Never   Smokeless tobacco: Never  Substance and Sexual Activity   Alcohol use: Yes    Comment: socially   Drug use: No   Sexual activity: Not on file  Other Topics Concern   Not on file  Social History Narrative   Not on file   Social Determinants of Health   Financial Resource Strain: Not on file  Food Insecurity: Not on file  Transportation Needs: Not on file  Physical Activity: Not on file  Stress: Not on file  Social Connections: Unknown (10/18/2021)   Received from [facility]   Social Network    Social Network: Not on file  Intimate Partner Violence: Unknown (09/09/2021)   Received from [facility]   HITS    Physically Hurt: Not on file    Insult or Talk Down To: Not on file    Threaten Physical Harm: Not on file    Scream or Curse: Not on file   Current Outpatient Medications on File Prior to Visit  Medication Sig Dispense Refill   alendronate (FOSAMAX) 70 MG tablet TAKE 1 TABLET BY MOUTH WEEKLY  WITH 8 OZ OF PLAIN WATER 30  MINUTES BEFORE FIRST FOOD, DRINK OR MEDS. STAY UPRIGHT FOR 30  MINS 12 tablet 3   celecoxib (CELEBREX) 100 MG capsule TAKE 1 CAPSULE BY MOUTH TWICE  DAILY 180 capsule 3   Cholecalciferol (VITAMIN D) 2000 UNITS CAPS Take 2,000 Units by mouth daily.     Coenzyme Q10 (COQ10 PO) Take 1 tablet by mouth daily.     gabapentin (NEURONTIN) 300 MG capsule TAKE 1 CAPSULE BY MOUTH AT  BEDTIME 90 capsule 3   Multiple Vitamins-Minerals (CENTRUM ULTRA WOMENS) TABS Take by mouth.     rosuvastatin (CRESTOR) 5 MG tablet TAKE 1 TABLET BY MOUTH  DAILY 90 tablet 3   No current facility-administered medications on file prior to visit.   No Known Allergies Family History  Problem Relation Age of Onset   Heart disease Mother    Breast cancer Neg Hx    PE: BP 138/80   Pulse 64   Ht 5' 3.5" (1.613 m)   Wt 148 lb (67.1 kg)   SpO2 99%   BMI 25.81 kg/m  Wt Readings from Last 3 Encounters:  01/30/23 148 lb (67.1 kg)  06/21/22 150 lb 6.4 oz (68.2 kg)  02/02/22 148 lb (67.1 kg)   Constitutional: normal weight, in NAD Eyes: no exophthalmos ENT: no masses palpated in neck, no cervical lymphadenopathy Cardiovascular: RRR, No MRG Respiratory: CTA B Musculoskeletal: no deformities Skin: no rashes Neurological: no tremor with outstretched hands  Assessment: 1. Osteoporosis  2.  History of vitamin D deficiency  Plan: 1. Osteoporosis  -Likely age-related/postmenopausal, but also possibly contributed to by her steroid injections.  She also has a family history of osteoporosis. -Reviewed latest bone density scan results.  In 05/2019, this showed improvement in the spine BMD (T-score improved from -2.8 to -2.2) and stability in the femoral neck scores.  We discussed that this is a good result.  We continued Prolia.  She had another bone density scan in 05/2021 which showed further improvement in the BMD of the spine (T-score improved from -2.2 to -1.8) and also improved T-score at the right femoral neck with ~stability of her left femoral neck T-score.  However, afterwards, she had problems with her insurance causing delays in getting the Prolia so I suggested to start back on Fosamax.  She did have a abdominal pain with this in the past but she wanted to try it again. She was also prepared to pay out-of-pocket for Prolia if absolutely needed.  -As of now, she tells me that she is tolerating Fosamax well.  No jaw/hip/thigh pain. -I recommended to wait until the next bone density which is due later this year to decide whether to switch  back to Prolia or continue Fosamax.  Zoledronic acid is also an option.  We discussed about all these options today.  She will get Medicare after her husband retires in 10/2023.  We may be able to switch to Prolia afterwards, if needed. -She is maintaining a good amount of calcium protein in her diet  -Also, continue exercise.  At last visit, I recommended more weightbearing exercises as she was mostly walking for exercise.  Discussed about adding wrist weights or backpack and also walking after meals, rather than before as this was found to help more with bone strengthening.  At today's visit, she is just walking (but limited due to foot pain) and also doing chair yoga. We again discussed about the importance of weightbearing sizes and I recommended sitdown exercises with elastic bands or dumbbells.  She will try this. -At today visit we will check a vitamin D level.  I reviewed the results of her most recent BMP from 06/2022 she had a normal calcium and kidney function.  We will not repeat this today. -She will be due for another bone density at the end of the year (after 05/10/2023-[facility]).  I advised her that closer to this date, to call the OB/GYN office and asked them to order the test. -we will see her back in a year  2.  History of vitamin D deficiency -We reviewed her latest vitamin D level from last visit and this was normal -She continues 3000 units vitamin D 3/day -We will recheck a vitamin D level now  Component     Latest Ref Rng 01/30/2023  VITD     30.00 - 100.00 ng/mL 42.88   Normal.  Carlus Pavlov, MD PhD [facility] Endocr[facility]

## 2023-02-06 ENCOUNTER — Ambulatory Visit: Payer: BC Managed Care – PPO | Admitting: Internal Medicine

## 2023-03-07 DIAGNOSIS — M79672 Pain in left foot: Secondary | ICD-10-CM | POA: Diagnosis not present

## 2023-03-09 ENCOUNTER — Ambulatory Visit: Payer: BC Managed Care – PPO | Admitting: Internal Medicine

## 2023-03-15 ENCOUNTER — Ambulatory Visit (INDEPENDENT_AMBULATORY_CARE_PROVIDER_SITE_OTHER): Payer: BC Managed Care – PPO | Admitting: Podiatry

## 2023-03-15 DIAGNOSIS — G629 Polyneuropathy, unspecified: Secondary | ICD-10-CM

## 2023-03-15 DIAGNOSIS — M778 Other enthesopathies, not elsewhere classified: Secondary | ICD-10-CM | POA: Diagnosis not present

## 2023-03-15 DIAGNOSIS — M19072 Primary osteoarthritis, left ankle and foot: Secondary | ICD-10-CM

## 2023-03-15 MED ORDER — LIDOCAINE 5 % EX PTCH: 1.0000 | MEDICATED_PATCH | CUTANEOUS | 3 refills | Status: DC

## 2023-03-18 NOTE — Progress Notes (Signed)
Subjective:  Patient ID: [name], female    DOB: 1956/03/29,  MRN: [id number]  Left midfoot arthritis MRI review  67 y.o. female presents with the above complaint. History confirmed with patient.  She is referred to me by Dr. Charlsie Merles.  She has had ongoing left foot pain for quite some time.  She has had multiple injections to alleviate the pain and inflammation that is occurring here.  Injections have become less effective and are lasting for shorter duration.  An MRI has been completed and she is here for surgical consultation options.  Objective:  Physical Exam: warm, good capillary refill, no trophic changes or ulcerative lesions, normal DP and PT pulses, normal sensory exam, and pain tenderness and palpable dorsal spurring over the second and third tarsometatarsal joints.  She has mild hallux valgus deformity with previously well-healed surgical scars, no pain over the lesser MTPs directly but piano key maneuver of the distal metatarsal produces pain in the midfoot mostly around the second and third but some in the first TMT   Radiographs: Multiple views x-ray of the left foot: Previous x-rays show second and third metatarsal osteotomies and previous hallux valgus correction with severe degenerative changes in the second and third tarsometatarsal joints.   Study Result  Narrative & Impression  CLINICAL DATA:  Dorsal midfoot pain around the talonavicular naviculocuneiform joints. Remote history of prior toe surgery.   EXAM: MRI OF THE LEFT FOOT WITHOUT CONTRAST   TECHNIQUE: Multiplanar, multisequence MR imaging of the left ankle was performed. No intravenous contrast was administered.   COMPARISON:  Left foot x-rays dated November 10, 2022.   FINDINGS: TENDONS   Peroneal: Peroneal longus tendon intact. Peroneal brevis intact.   Posteromedial: Posterior tibial tendon intact. Flexor digitorum longus tendon intact. Flexor hallucis longus tendon intact.   Anterior: Tibialis  anterior tendon intact. Extensor hallucis longus tendon intact. Extensor digitorum longus tendon intact.   Achilles:  Intact.   Plantar Fascia: Intact.   LIGAMENTS   Lateral: Anterior talofibular ligament intact. Calcaneofibular ligament intact. Posterior talofibular ligament intact. Anterior and posterior tibiofibular ligaments intact.   Medial: Deltoid ligament intact. Spring ligament intact.   CARTILAGE   Ankle Joint: No joint effusion. Normal ankle mortise. No chondral defect. Tiny focus of degenerative subchondral marrow edema in the medial talar dome.   Subtalar Joints/Sinus Tarsi: Normal subtalar joints. No subtalar joint effusion. Normal sinus tarsi.   Bones: No suspicious marrow signal abnormality. No fracture or dislocation. Mild navicular-medial cuneiform joint osteoarthritis. Moderate to severe second TMT joint osteoarthritis. Mild-to-moderate third TMT joint osteoarthritis.   Soft Tissue: No soft tissue mass or fluid collection.   IMPRESSION: 1. 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     Electronically Signed   By: Obie Dredge M.D.   On: 12/19/2022 11:19   Assessment:   1. Primary osteoarthritis of left foot   2. Peripheral neuritis   3. Extensor tendinitis of foot      Plan:  Patient was evaluated and treated and all questions answered.  She is severe end-stage arthritis of the left midfoot.  The majority of her pain is centered around the second tarsometatarsal joint.  Injections have been effective thus far but are becoming less effective.  We discussed permanent correction with tarsometatarsal fusion of the first second and third tarsometatarsal joints.  The second is the worst but does have some in the third and lateral first as well.  We discussed the reason for fusing all  3 joints with the amount of stability that it provides.  We also discussed the rationale for bone grafting and internal fixation.  We discussed  risks and benefits of this procedure, we discussed that she does have a history of osteoporosis and likely due to this would need a prolonged period of nonweightbearing from 6 to 8 weeks with cast immobilization.  She is due for a bone density test this December, her scores have improved with successive testing, however she has been switched with her medications recently to Fosamax she feels that Prolia was the best option for her.  She is going to consider her surgical option and discussed with her husband, likely they will not plan for surgery until next spring or summer.  Rx sent for lidocaine 5% patches for topical relief.  She will follow-up as needed with Dr. Charlsie Merles for injections and notify me when she is ready to plan for her midfoot reconstruction  Return when ready to schedule surgery.

## 2023-03-19 ENCOUNTER — Ambulatory Visit: Payer: BC Managed Care – PPO | Admitting: Internal Medicine

## 2023-03-19 ENCOUNTER — Encounter: Payer: Self-pay | Admitting: Internal Medicine

## 2023-03-19 VITALS — BP 130/82 | HR 82 | Temp 98.7°F | Ht 63.5 in | Wt 150.0 lb

## 2023-03-19 DIAGNOSIS — R350 Frequency of micturition: Secondary | ICD-10-CM

## 2023-03-19 LAB — POCT URINALYSIS DIP (CLINITEK)
Bilirubin, UA: NEGATIVE
Blood, UA: NEGATIVE
Glucose, UA: NEGATIVE mg/dL
Ketones, POC UA: NEGATIVE mg/dL
Leukocytes, UA: NEGATIVE
Nitrite, UA: NEGATIVE
POC PROTEIN,UA: NEGATIVE
Spec Grav, UA: 1.01 (ref 1.010–1.025)
Urobilinogen, UA: 0.2 U/dL
pH, UA: 6.5 (ref 5.0–8.0)

## 2023-03-19 NOTE — Progress Notes (Signed)
Patient Care Team: Margaree Mackintosh, MD as PCP - General (Internal Medicine) Pollyann Savoy, MD as Consulting Physician (Rheumatology) Ollen Gross, MD as Consulting Physician (Orthopedic Surgery) Shirlean Kelly, MD as Consulting Physician (Neurosurgery) Vida Rigger, MD as Consulting Physician (Gastroenterology)  Visit Date: 03/19/23  Subjective:    Patient ID: [name] , Female   DOB: 1956/04/18, 67 y.o.    MRN: [id number]   67 y.o. Female presents today for headache, abdominal pain, lower back pain recently. Denies urinary frequency, dysuria, blood in urine, dark urine. History of seasonal allergies. Denies travel. History of UTI with urinary frequency, dysuria. This does not feel like a normal UTI for her but she reports she tested positive for leukocytes on an at-home UTI test and this worried her.  Past Medical History:  Diagnosis Date   Allergy    Arthritis    Complication of anesthesia    H/O calcium pyrophosphate deposition disease (CPPD)    Migraine    PONV (postoperative nausea and vomiting)      Family History  Problem Relation Age of Onset   Heart disease Mother    Breast cancer Neg Hx     Social Hx: Married. Husband is a Radiologist.She has a 4 year college degree and is a homemaker. Has 2 adult children, a son and a daughter.   Dr. Langston Masker is GYN physician.   Review of Systems  Constitutional:  Negative for fever and malaise/fatigue.  HENT:  Negative for congestion.   Eyes:  Negative for blurred vision.  Respiratory:  Negative for cough and shortness of breath.   Cardiovascular:  Negative for chest pain, palpitations and leg swelling.  Gastrointestinal:  Positive for abdominal pain. Negative for vomiting.  Genitourinary:  Positive for flank pain.  Musculoskeletal:  Negative for back pain.  Skin:  Negative for rash.  Neurological:  Positive for headaches. Negative for loss of consciousness.        Objective:   Vitals: BP 130/82    Pulse 82   Temp 98.7 F (37.1 C)   Ht 5' 3.5" (1.613 m)   Wt 150 lb (68 kg)   SpO2 97%   BMI 26.15 kg/m    Physical Exam Vitals and nursing note reviewed.  Constitutional:      General: She is not in acute distress.    Appearance: Normal appearance. She is not toxic-appearing.  HENT:     Head: Normocephalic and atraumatic.  Pulmonary:     Effort: Pulmonary effort is normal.  Skin:    General: Skin is warm and dry.  Neurological:     Mental Status: She is alert and oriented to person, place, and time. Mental status is at baseline.  Psychiatric:        Mood and Affect: Mood normal.        Behavior: Behavior normal.        Thought Content: Thought content normal.        Judgment: Judgment normal.      Results:   Studies obtained and personally reviewed by me:   Labs:       Component Value Date/Time   NA 141 06/20/2022 0906   K 4.2 06/20/2022 0906   CL 104 06/20/2022 0906   CO2 31 06/20/2022 0906   GLUCOSE 86 06/20/2022 0906   BUN 10 06/20/2022 0906   CREATININE 0.77 06/20/2022 0906   CALCIUM 9.5 06/20/2022 0906   PROT 7.1 06/20/2022 0906   ALBUMIN 4.0 12/16/2015 1101  AST 25 06/20/2022 0906   ALT 21 06/20/2022 0906   ALKPHOS 73 12/16/2015 1101   BILITOT 0.5 06/20/2022 0906   GFRNONAA 87 03/18/2019 1207   GFRAA 101 03/18/2019 1207     Lab Results  Component Value Date   WBC 3.2 (L) 06/20/2022   HGB 14.1 06/20/2022   HCT 42.9 06/20/2022   MCV 93.3 06/20/2022   PLT 295 06/20/2022    Lab Results  Component Value Date   CHOL 182 06/20/2022   HDL 94 06/20/2022   LDLCALC 70 06/20/2022   TRIG 95 06/20/2022   CHOLHDL 1.9 06/20/2022    No results found for: "HGBA1C"   Lab Results  Component Value Date   TSH 1.88 06/20/2022      Assessment & Plan:   UTI concerns: has some mild flank pain. She said that she took an at-home UTI test that was positive for leukocytes. We did a urinalysis today and it was normal.  Vaccine counseling: reports she is  UTD on her Covid-19, flu, pneumonia vaccines.  Plan: She will call if she develops UTI symptoms or has any new concerns.  I,Alexander Ruley,acting as a Neurosurgeon for Margaree Mackintosh, MD.,have documented all relevant documentation on the behalf of Margaree Mackintosh, MD,as directed by  Margaree Mackintosh, MD while in the presence of Margaree Mackintosh, MD.   I, Margaree Mackintosh, MD, have reviewed all documentation for this visit. The documentation on 03/26/23 for the exam, diagnosis, procedures, and orders are all accurate and complete.

## 2023-03-26 NOTE — Patient Instructions (Signed)
As always, it was a pleasure to see you today. The urine dipstick that we performed in office today looks normal to me. Please call if you develop frequency or dysuria in the next few days. We are always glad to help anytime.

## 2023-03-28 ENCOUNTER — Telehealth: Payer: Self-pay

## 2023-03-28 ENCOUNTER — Ambulatory Visit (INDEPENDENT_AMBULATORY_CARE_PROVIDER_SITE_OTHER): Payer: BC Managed Care – PPO | Admitting: Internal Medicine

## 2023-03-28 ENCOUNTER — Encounter: Payer: Self-pay | Admitting: Internal Medicine

## 2023-03-28 VITALS — BP 130/80 | HR 67 | Temp 98.0°F | Ht 63.5 in | Wt 150.0 lb

## 2023-03-28 DIAGNOSIS — R300 Dysuria: Secondary | ICD-10-CM | POA: Diagnosis not present

## 2023-03-28 DIAGNOSIS — N390 Urinary tract infection, site not specified: Secondary | ICD-10-CM

## 2023-03-28 DIAGNOSIS — R8290 Unspecified abnormal findings in urine: Secondary | ICD-10-CM | POA: Diagnosis not present

## 2023-03-28 LAB — POCT URINALYSIS DIP (CLINITEK)
Bilirubin, UA: NEGATIVE
Glucose, UA: NEGATIVE mg/dL
Ketones, POC UA: NEGATIVE mg/dL
Nitrite, UA: POSITIVE — AB
POC PROTEIN,UA: 30 — AB
Spec Grav, UA: 1.01 (ref 1.010–1.025)
Urobilinogen, UA: 0.2 U/dL
pH, UA: 6 (ref 5.0–8.0)

## 2023-03-28 MED ORDER — FLUCONAZOLE 150 MG PO TABS: 150.0000 mg | ORAL_TABLET | Freq: Once | ORAL | 1 refills | Status: AC

## 2023-03-28 MED ORDER — CIPROFLOXACIN HCL 250 MG PO TABS: 250.0000 mg | ORAL_TABLET | Freq: Two times a day (BID) | ORAL | 1 refills | Status: DC

## 2023-03-28 NOTE — Patient Instructions (Signed)
Urine culture obtained.  Please start Cipro 250 mg twice daily for 7 days.  Call if not improving in 24 to 48 hours or sooner if worse.  Please return after completing course of Cipro for nurse visit and urine check.

## 2023-03-28 NOTE — Telephone Encounter (Signed)
Patient called she is having burning with urination, she feels like she is not emptying her bladder all the way. She would like to be seen. She is going out of town tomorrow.

## 2023-03-28 NOTE — Progress Notes (Signed)
   Subjective:    Patient ID: [name], female    DOB: [birth year], 67 y.o.   MRN: [id number]  HPI Patient was seen here on October 14 with headache, abdominal discomfort and lower back pain.  Was concerned she might have a UTI.  Has history of urinary tract infections generally presenting as frequency and dysuria.  She denied urinary frequency, dysuria, hematuria or dark urine.  She had done a home test which was positive for leukocytes prior to coming to the office and she was worried she might have a UTI.  Urine dipstick specimen checked in the office on October 14 did not show a UTI.  She called today saying she was having dysuria and did not feel her bladder was emptying all the way.  She is going out of town tomorrow.  We advised her to come in today.  Past medical history: History of calcium pyrophosphate deposition disease (CPPD)  Review of Systems no nausea, vomiting, headache.  She has had some general malaise and urinary symptoms     Objective:   Physical Exam vital signs reviewed.  She is afebrile.  She is seen in the office in no acute distress.  Blood pressure 130/80, pulse 67, regular.  Temperature is 98 degrees by ear thermometer.  Pulse oximetry 97%.  Weight 150 pounds.  BMI 26.15.  Clean-catch urine specimen shows urine to be slightly orange-tinged and slightly turbid.  Point-of-care urine dipstick specimen shows specific gravity being 1.010 with large occult blood, positive for nitrite and large LE.  Urine was sent for culture.  No CVA tenderness.    Assessment & Plan:   Acute urinary tract infection  Plan: Has not had a recent urinary tract infection upon review of her old chart.  We will prescribe Cipro 250 mg twice daily for 7 days with 1 refill pending urine culture results.  She is headed out of town tomorrow.  Will return after finishing course of Cipro for recheck on urine specimen.  Call if symptoms worsen or not improving within 48 hours.  Culture is  pending.

## 2023-03-28 NOTE — Telephone Encounter (Signed)
Scheduled

## 2023-03-30 LAB — URINE CULTURE
MICRO NUMBER:: 15633541
SPECIMEN QUALITY:: ADEQUATE

## 2023-04-08 ENCOUNTER — Other Ambulatory Visit: Payer: Self-pay | Admitting: Medical Genetics

## 2023-04-08 DIAGNOSIS — Z006 Encounter for examination for normal comparison and control in clinical research program: Secondary | ICD-10-CM

## 2023-04-16 ENCOUNTER — Ambulatory Visit (INDEPENDENT_AMBULATORY_CARE_PROVIDER_SITE_OTHER): Payer: BC Managed Care – PPO | Admitting: Internal Medicine

## 2023-04-16 VITALS — BP 110/80 | HR 77 | Ht 63.5 in | Wt 150.0 lb

## 2023-04-16 DIAGNOSIS — N390 Urinary tract infection, site not specified: Secondary | ICD-10-CM | POA: Diagnosis not present

## 2023-04-16 LAB — POCT URINALYSIS DIP (CLINITEK)
Bilirubin, UA: NEGATIVE
Blood, UA: NEGATIVE
Glucose, UA: NEGATIVE mg/dL
Ketones, POC UA: NEGATIVE mg/dL
Nitrite, UA: NEGATIVE
POC PROTEIN,UA: NEGATIVE
Spec Grav, UA: 1.01 (ref 1.010–1.025)
Urobilinogen, UA: 0.2 U/dL
pH, UA: 6.5 (ref 5.0–8.0)

## 2023-04-16 NOTE — Patient Instructions (Signed)
It was a pleasure to see you today. Urine specimen has trace leukocytes and will be cultured. We will contact you with results.

## 2023-04-16 NOTE — Progress Notes (Signed)
Patient Care Team: Margaree Mackintosh, MD as PCP - General (Internal Medicine) Pollyann Savoy, MD as Consulting Physician (Rheumatology) Ollen Gross, MD as Consulting Physician (Orthopedic Surgery) Shirlean Kelly, MD as Consulting Physician (Neurosurgery) Vida Rigger, MD as Consulting Physician (Gastroenterology)  Visit Date: 04/16/23  Subjective:    Patient ID: [name] , Female   DOB: 05/18/1956, 67 y.o.    MRN: [id number]   67 y.o. Female presents today for UTI recheck. Seen here with UTI symptoms  on 03/28/23. Cipro 250 mg twice daily for 7 days was prescribed. Culture grew greater than 100,000 col/ml E.coli sensitive to Cipro. Denies urinary symptoms. Repeat urine specimen today shows trace LE. Feels well but will re-culture urine today.  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 Has adult children, a son and a daughter. Non-smoker. Social alcohol consumption.     Review of Systems  Constitutional:  Negative for fever and malaise/fatigue.  HENT:  Negative for congestion.   Eyes:  Negative for blurred vision.  Respiratory:  Negative for cough and shortness of breath.   Cardiovascular:  Negative for chest pain, palpitations and leg swelling.  Gastrointestinal:  Negative for vomiting.  Genitourinary:  Negative for dysuria, flank pain, frequency, hematuria and urgency.  Musculoskeletal:  Negative for back pain.  Skin:  Negative for rash.  Neurological:  Negative for loss of consciousness and headaches.        Objective:   Vitals: BP 110/80   Pulse 77   Ht 5' 3.5" (1.613 m)   Wt 150 lb (68 kg)   SpO2 98%   BMI 26.15 kg/m     Physical Exam Vitals and nursing note reviewed.  Constitutional:      General: She is not in acute distress.    Appearance: Normal appearance. She is not toxic-appearing.  HENT:     Head: Normocephalic and atraumatic.  Pulmonary:     Effort: Pulmonary effort is normal.  Skin:    General: Skin is warm and dry.  Neurological:     Mental Status: She is alert and oriented to person, place, and time. Mental status is at baseline.  Psychiatric:        Mood and Affect: Mood normal.        Behavior: Behavior normal.        Thought Content: Thought content normal.        Judgment: Judgment normal.       Results:   Studies obtained and personally reviewed by me:   Labs:       Component Value Date/Time   NA 141 06/20/2022 0906   K 4.2 06/20/2022 0906   CL 104 06/20/2022 0906   CO2 31 06/20/2022 0906   GLUCOSE 86 06/20/2022 0906   BUN 10 06/20/2022 0906   CREATININE 0.77 06/20/2022 0906   CALCIUM 9.5 06/20/2022 0906   PROT 7.1 06/20/2022 0906   ALBUMIN 4.0 12/16/2015 1101   AST 25 06/20/2022 0906   ALT 21 06/20/2022 0906   ALKPHOS 73 12/16/2015 1101   BILITOT 0.5 06/20/2022 0906  GFRNONAA 87 03/18/2019 1207   GFRAA 101 03/18/2019 1207     Lab Results  Component Value Date   WBC 3.2 (L) 06/20/2022   HGB 14.1 06/20/2022   HCT 42.9 06/20/2022   MCV 93.3 06/20/2022   PLT 295 06/20/2022    Lab Results  Component Value Date   CHOL 182 06/20/2022   HDL 94 06/20/2022   LDLCALC 70 06/20/2022   TRIG 95 06/20/2022   CHOLHDL 1.9 06/20/2022    No results found for: "HGBA1C"   Lab Results  Component Value Date   TSH 1.88 06/20/2022      Assessment & Plan:   UTI follow up: She is asymptomatic however, urinalysis today showed trace leukocytes. Urine culture ordered. Will contact with results. No antibiotics prescribed today.    I,Alexander Ruley,acting as a Neurosurgeon for Margaree Mackintosh, MD.,have documented all relevant documentation on the behalf of Margaree Mackintosh, MD,as directed by  Margaree Mackintosh, MD while in the presence of Margaree Mackintosh, MD.   I, Margaree Mackintosh, MD, have reviewed all documentation for this visit. The documentation on 04/16/23 for the exam, diagnosis, procedures, and orders are all accurate and complete.

## 2023-04-17 LAB — URINE CULTURE
MICRO NUMBER:: 15712555
Result:: NO GROWTH
SPECIMEN QUALITY:: ADEQUATE

## 2023-04-18 ENCOUNTER — Encounter: Payer: Self-pay | Admitting: Internal Medicine

## 2023-04-25 ENCOUNTER — Other Ambulatory Visit (HOSPITAL_COMMUNITY)
Admission: RE | Admit: 2023-04-25 | Discharge: 2023-04-25 | Disposition: A | Discharge status: Home / Self Care | Payer: BC Managed Care – PPO | Source: Ambulatory Visit | Attending: Oncology | Admitting: Oncology

## 2023-04-25 DIAGNOSIS — Z006 Encounter for examination for normal comparison and control in clinical research program: Secondary | ICD-10-CM | POA: Insufficient documentation

## 2023-05-08 LAB — GENECONNECT MOLECULAR SCREEN: Genetic Analysis Overall Interpretation: NEGATIVE

## 2023-05-17 ENCOUNTER — Ambulatory Visit (INDEPENDENT_AMBULATORY_CARE_PROVIDER_SITE_OTHER): Payer: BC Managed Care – PPO | Admitting: Internal Medicine

## 2023-05-17 VITALS — BP 120/80 | HR 84 | Temp 98.5°F | Ht 63.5 in | Wt 150.0 lb

## 2023-05-17 DIAGNOSIS — H6503 Acute serous otitis media, bilateral: Secondary | ICD-10-CM | POA: Diagnosis not present

## 2023-05-17 DIAGNOSIS — J069 Acute upper respiratory infection, unspecified: Secondary | ICD-10-CM | POA: Diagnosis not present

## 2023-05-17 DIAGNOSIS — J029 Acute pharyngitis, unspecified: Secondary | ICD-10-CM | POA: Diagnosis not present

## 2023-05-17 LAB — POCT RAPID STREP A (OFFICE): Rapid Strep A Screen: NEGATIVE

## 2023-05-17 MED ORDER — FLUCONAZOLE 150 MG PO TABS: 150.0000 mg | ORAL_TABLET | Freq: Once | ORAL | 1 refills | Status: AC

## 2023-05-17 MED ORDER — LEVOFLOXACIN 500 MG PO TABS: 500.0000 mg | ORAL_TABLET | Freq: Every day | ORAL | 0 refills | Status: DC

## 2023-05-17 NOTE — Progress Notes (Addendum)
Patient Care Team: Margaree Mackintosh, MD as PCP - General (Internal Medicine) Pollyann Savoy, MD as Consulting Physician (Rheumatology) Ollen Gross, MD as Consulting Physician (Orthopedic Surgery) Shirlean Kelly, MD as Consulting Physician (Neurosurgery) Vida Rigger, MD as Consulting Physician (Gastroenterology)  Visit Date: 05/17/23  Subjective:    Patient ID: [name] , Female   DOB: 27-Dec-1955, 67 y.o.    MRN: [id number]   67 y.o. Female presents today for sore throat, cough with discolored sputum, headache. Symptoms started with sore throat on 05/06/23. Reports negative at-home UTI test this morning.  Past Medical History:  Diagnosis Date   Allergy    Arthritis    Complication of anesthesia    H/O calcium pyrophosphate deposition disease (CPPD)    Migraine    PONV (postoperative nausea and vomiting)      Family History  Problem Relation Age of Onset   Heart disease Mother    Breast cancer Neg Hx     Social History   Social History Narrative   Not on file      Review of Systems  Constitutional:  Negative for fever and malaise/fatigue.  HENT:  Positive for sore throat. Negative for congestion.   Eyes:  Negative for blurred vision.  Respiratory:  Positive for cough and sputum production. Negative for shortness of breath.   Cardiovascular:  Negative for chest pain, palpitations and leg swelling.  Gastrointestinal:  Negative for vomiting.  Musculoskeletal:  Negative for back pain.  Skin:  Negative for rash.  Neurological:  Positive for headaches. Negative for loss of consciousness.        Objective:   Vitals: BP 120/80   Pulse 84   Temp 98.5 F (36.9 C)   Ht 5' 3.5" (1.613 m)   Wt 150 lb (68 kg)   SpO2 98%   BMI 26.15 kg/m    Physical Exam Vitals and nursing note reviewed.  Constitutional:      General: She is not in acute distress.    Appearance: Normal appearance. She is not toxic-appearing.  HENT:     Head: Normocephalic and  atraumatic.     Right Ear: Hearing, ear canal and external ear normal.     Left Ear: Hearing, ear canal and external ear normal.     Ears:     Comments: Right TM pink, full with splayed light reflex. Left TM dull, pink, slightly full with splayed light reflex.    Mouth/Throat:     Comments: Pharynx injected without exudate. Pulmonary:     Effort: Pulmonary effort is normal. No respiratory distress.     Breath sounds: Normal breath sounds. No wheezing or rales.  Skin:    General: Skin is warm and dry.  Neurological:     Mental Status: She is alert and oriented to person, place, and time. Mental status is at baseline.  Psychiatric:        Mood and Affect: Mood normal.        Behavior: Behavior normal.        Thought Content: Thought content normal.        Judgment: Judgment normal.       Results:   Studies obtained and personally reviewed by me:   Labs:       Component Value Date/Time   NA 141 06/20/2022 0906   K 4.2 06/20/2022 0906   CL 104 06/20/2022 0906   CO2 31 06/20/2022 0906   GLUCOSE 86 06/20/2022 0906   BUN 10 06/20/2022  0906   CREATININE 0.77 06/20/2022 0906   CALCIUM 9.5 06/20/2022 0906   PROT 7.1 06/20/2022 0906   ALBUMIN 4.0 12/16/2015 1101   AST 25 06/20/2022 0906   ALT 21 06/20/2022 0906   ALKPHOS 73 12/16/2015 1101   BILITOT 0.5 06/20/2022 0906   GFRNONAA 87 03/18/2019 1207   GFRAA 101 03/18/2019 1207     Lab Results  Component Value Date   WBC 3.2 (L) 06/20/2022   HGB 14.1 06/20/2022   HCT 42.9 06/20/2022   MCV 93.3 06/20/2022   PLT 295 06/20/2022    Lab Results  Component Value Date   CHOL 182 06/20/2022   HDL 94 06/20/2022   LDLCALC 70 06/20/2022   TRIG 95 06/20/2022   CHOLHDL 1.9 06/20/2022    No results found for: "HGBA1C"   Lab Results  Component Value Date   TSH 1.88 06/20/2022      Assessment & Plan:   Persistent upper respiratory infection / bilateral serous otitis media: strep screen negative. Prescribed  levofloxacin 500 mg daily for 10 days.  May take Diflucan 150 mg once if needed for Candida vaginitis. Get plenty of rest and stay well-hydrated. Contact us if symptoms worsen or fail to improve.  Vaccine counseling: UTD on flu booster.    I,Alexander Ruley,acting as a Neurosurgeon for Margaree Mackintosh, MD.,have documented all relevant documentation on the behalf of Margaree Mackintosh, MD,as directed by  Margaree Mackintosh, MD while in the presence of Margaree Mackintosh, MD.   I, Margaree Mackintosh, MD, have reviewed all documentation for this visit. The documentation on 06/02/23 for the exam, diagnosis, procedures, and orders are all accurate and complete.

## 2023-05-22 DIAGNOSIS — Z13820 Encounter for screening for osteoporosis: Secondary | ICD-10-CM | POA: Diagnosis not present

## 2023-05-28 ENCOUNTER — Encounter: Payer: Self-pay | Admitting: Internal Medicine

## 2023-06-02 ENCOUNTER — Encounter: Payer: Self-pay | Admitting: Internal Medicine

## 2023-06-02 NOTE — Patient Instructions (Addendum)
Your strep screen is negative.  Prescribed Levaquin 500 milligrams daily for 10 days and Diflucan 150 mg tablet if needed for Candida vaginitis.

## 2023-06-14 ENCOUNTER — Other Ambulatory Visit: Payer: Self-pay | Admitting: Internal Medicine

## 2023-06-14 DIAGNOSIS — M810 Age-related osteoporosis without current pathological fracture: Secondary | ICD-10-CM | POA: Insufficient documentation

## 2023-06-20 ENCOUNTER — Telehealth: Payer: Self-pay | Admitting: Pharmacy Technician

## 2023-06-20 NOTE — Telephone Encounter (Signed)
 Dr. Aldona Amel,  Patient has been approved for prolia  and will be scheduled as soon as possible.  Auth Submission: APPROVED Site of care: Site of care: [facility] Payer: [employer] Medication & CPT/J Code(s) submitted: Prolia  (Denosumab ) [id number] Route of submission (phone, fax, portal):   Phone # Fax # Auth type: Buy/Bill PB Units/visits requested: 2 DOSES Reference number: [id number] Approval from: 06/19/23 to 06/18/24

## 2023-06-22 ENCOUNTER — Other Ambulatory Visit: Payer: BC Managed Care – PPO

## 2023-06-22 DIAGNOSIS — M8580 Other specified disorders of bone density and structure, unspecified site: Secondary | ICD-10-CM

## 2023-06-22 DIAGNOSIS — E7800 Pure hypercholesterolemia, unspecified: Secondary | ICD-10-CM

## 2023-06-22 DIAGNOSIS — Z1329 Encounter for screening for other suspected endocrine disorder: Secondary | ICD-10-CM | POA: Diagnosis not present

## 2023-06-22 DIAGNOSIS — Z0000 Encounter for general adult medical examination without abnormal findings: Secondary | ICD-10-CM

## 2023-06-23 LAB — COMPLETE METABOLIC PANEL WITH GFR
AG Ratio: 1.5 (calc) (ref 1.0–2.5)
ALT: 17 U/L (ref 6–29)
AST: 26 U/L (ref 10–35)
Albumin: 4.2 g/dL (ref 3.6–5.1)
Alkaline phosphatase (APISO): 86 U/L (ref 37–153)
BUN: 12 mg/dL (ref 7–25)
CO2: 28 mmol/L (ref 20–32)
Calcium: 9.4 mg/dL (ref 8.6–10.4)
Chloride: 105 mmol/L (ref 98–110)
Creat: 0.79 mg/dL (ref 0.50–1.05)
Globulin: 2.8 g/dL (ref 1.9–3.7)
Glucose, Bld: 90 mg/dL (ref 65–99)
Potassium: 4.7 mmol/L (ref 3.5–5.3)
Sodium: 140 mmol/L (ref 135–146)
Total Bilirubin: 0.7 mg/dL (ref 0.2–1.2)
Total Protein: 7 g/dL (ref 6.1–8.1)
eGFR: 83 mL/min/{1.73_m2} (ref >=60)

## 2023-06-23 LAB — CBC WITH DIFFERENTIAL/PLATELET
Absolute Lymphocytes: 1260 {cells}/uL (ref 850–3900)
Absolute Monocytes: 319 {cells}/uL (ref 200–950)
Basophils Absolute: 50 {cells}/uL (ref 0–200)
Basophils Relative: 1.8 %
Eosinophils Absolute: 81 {cells}/uL (ref 15–500)
Eosinophils Relative: 2.9 %
HCT: 43.8 % (ref 35.0–45.0)
Hemoglobin: 13.9 g/dL (ref 11.7–15.5)
MCH: 30 pg (ref 27.0–33.0)
MCHC: 31.7 g/dL — ABNORMAL LOW (ref 32.0–36.0)
MCV: 94.6 fL (ref 80.0–100.0)
MPV: 9.8 fL (ref 7.5–12.5)
Monocytes Relative: 11.4 %
Neutro Abs: 1089 {cells}/uL — ABNORMAL LOW (ref 1500–7800)
Neutrophils Relative %: 38.9 %
Platelets: 310 10*3/uL (ref 140–400)
RBC: 4.63 10*6/uL (ref 3.80–5.10)
RDW: 12.5 % (ref 11.0–15.0)
Total Lymphocyte: 45 %
WBC: 2.8 10*3/uL — ABNORMAL LOW (ref 3.8–10.8)

## 2023-06-23 LAB — LIPID PANEL
Cholesterol: 177 mg/dL (ref <200)
HDL: 97 mg/dL (ref >=50)
LDL Cholesterol (Calc): 67 mg/dL
Non-HDL Cholesterol (Calc): 80 mg/dL (ref <130)
Total CHOL/HDL Ratio: 1.8 (calc) (ref <5.0)
Triglycerides: 53 mg/dL (ref <150)

## 2023-06-23 LAB — TSH: TSH: 1.25 m[IU]/L (ref 0.40–4.50)

## 2023-06-25 ENCOUNTER — Encounter: Payer: Self-pay | Admitting: Internal Medicine

## 2023-06-25 ENCOUNTER — Ambulatory Visit (INDEPENDENT_AMBULATORY_CARE_PROVIDER_SITE_OTHER): Payer: BC Managed Care – PPO | Admitting: Internal Medicine

## 2023-06-25 VITALS — BP 140/90 | HR 80 | Ht 63.5 in | Wt 149.0 lb

## 2023-06-25 DIAGNOSIS — J301 Allergic rhinitis due to pollen: Secondary | ICD-10-CM | POA: Diagnosis not present

## 2023-06-25 DIAGNOSIS — Z0000 Encounter for general adult medical examination without abnormal findings: Secondary | ICD-10-CM

## 2023-06-25 DIAGNOSIS — E739 Lactose intolerance, unspecified: Secondary | ICD-10-CM | POA: Diagnosis not present

## 2023-06-25 DIAGNOSIS — E7800 Pure hypercholesterolemia, unspecified: Secondary | ICD-10-CM | POA: Diagnosis not present

## 2023-06-25 DIAGNOSIS — R7989 Other specified abnormal findings of blood chemistry: Secondary | ICD-10-CM

## 2023-06-25 DIAGNOSIS — D708 Other neutropenia: Secondary | ICD-10-CM

## 2023-06-25 DIAGNOSIS — Z23 Encounter for immunization: Secondary | ICD-10-CM

## 2023-06-25 DIAGNOSIS — Z8739 Personal history of other diseases of the musculoskeletal system and connective tissue: Secondary | ICD-10-CM | POA: Diagnosis not present

## 2023-06-25 LAB — POCT URINALYSIS DIP (CLINITEK)
Bilirubin, UA: NEGATIVE
Blood, UA: NEGATIVE
Glucose, UA: NEGATIVE mg/dL
Ketones, POC UA: NEGATIVE mg/dL
Leukocytes, UA: NEGATIVE
Nitrite, UA: NEGATIVE
POC PROTEIN,UA: NEGATIVE
Spec Grav, UA: 1.01 (ref 1.010–1.025)
Urobilinogen, UA: 0.2 U/dL
pH, UA: 6 (ref 5.0–8.0)

## 2023-06-25 MED ORDER — DICYCLOMINE HCL 20 MG PO TABS: 20.0000 mg | ORAL_TABLET | Freq: Three times a day (TID) | ORAL | 1 refills | Status: DC

## 2023-06-25 NOTE — Progress Notes (Shared)
Annual Wellness Visit   Patient Care Team: Yukari Flax, Luanna Cole, MD as PCP - General (Internal Medicine) Pollyann Savoy, MD as Consulting Physician (Rheumatology) Ollen Gross, MD as Consulting Physician (Orthopedic Surgery) Shirlean Kelly, MD as Consulting Physician (Neurosurgery) Vida Rigger, MD as Consulting Physician (Gastroenterology)  Visit Date: 06/25/23   Chief Complaint  Patient presents with   Annual Exam   Subjective:  Patient: [name], Female DOB: 09/04/1955, 68 y.o. MRN: [id number]  [name] is a 68 y.o. Female who presents today for her Annual Wellness Visit.   Expresses belief that her lactose intolerance has worsened as she can usually take a Lactaid and not experience any symptoms, but recently had pizza and still experienced diarrhea despite having taking a Lactaid beforehand.   Labs 06/22/2023 CBC: WBC low at 2.8 , which has been chronically low since 2018; MCHC slightly low at 31.7; Neutro Abs 1,089, also chronically low since 2018.  CMP: WNL Lipid Panel: WNL TSH: 1.25  Colonoscopy 05/30/18 was normal with repeat recommendation of 2029. She reports that she has an upcoming colonoscopy next week.   PAP Smear on 01/18/15 was normal. Overdue since 2019, but likely discontinued now due to age.    Mammogram on 10/24/22 with stable to slight decrease in size of probably benign masses in the right breast; No mammographic evidence of malignancy in the left breast, and a repeat recommendation of 2025. Dr. Mitchel Honour is GYN physician.    Bone Density on 06/25/12 with Bone Density: T-score -1.8, indicates osteopenia. No repeat recommendation. Has had Prolia injections with Endocrinologist.  Currently on Fosamax 70 mg per Dr. Tera Helper.    Vaccine Counseling: Due for PNA, Tdap, and Covid-19; UTD on Shingles, Flu. Patient expresses surety that she is UTD with PNA and Covid-19 through her [facility] pharmacy - will contact to confirm Past Medical  History:  Diagnosis Date   Allergy    Arthritis    Complication of anesthesia    H/O calcium pyrophosphate deposition disease (CPPD)    Migraine    PONV (postoperative nausea and vomiting)   Medical/Surgical History Narrative:  2023 - Diagnostic mammography in November with right breast biopsy that showed fibrocystic changes with apocrine metaplasia, negative for malignancy and atypia.  2012 - Bilateral Knee Arthroplasties  Other - history of Calcium Pyrophosphate Deposition Disease (CPPD). History of Allergic Rhinitis with allergies to grass, pollens and molds - no known drug allergies. History of Migraine Headaches and some issues with muscle contraction headaches occasionally starting in her neck. History of Tendinitis issues and had seen Dr. Charlsie Merles, Podiatrist; was found to have Inflammation mostly at the Metatarsal Cuneiform Joint Second and Third Metatarsals; has received steroid injections with Xylocaine for this. Family History  Problem Relation Age of Onset   Heart disease Mother    Breast cancer Neg Hx   Family History Narrative: Father died of Aplastic Anemia with history of CVA.  Mother with history of Subependymoma of Fourth Ventricle.  Sister died of Lower Extremity Sarcoma at the age of 76.   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 She has adult children, son and a daughter. Non-smoker. Social alcohol consumption.    Review of Systems  Constitutional:  Negative for chills, fever, malaise/fatigue and weight loss.  HENT:  Negative for hearing loss, sinus pain and sore throat.   Respiratory:  Negative for cough, hemoptysis and shortness of breath.  Cardiovascular:  Negative for chest pain, palpitations, leg swelling and PND.  Gastrointestinal:  Positive for diarrhea (after eating pizza, despite having taken a Lactaid). Negative for abdominal pain, constipation, heartburn, nausea and vomiting.   Genitourinary:  Negative for dysuria, frequency and urgency.  Musculoskeletal:  Negative for back pain, myalgias and neck pain.  Skin:  Negative for itching and rash.  Neurological:  Negative for dizziness, tingling, seizures and headaches.  Endo/Heme/Allergies:  Negative for polydipsia.  Psychiatric/Behavioral:  Negative for depression. The patient is not nervous/anxious.     Objective:  Vitals: BP (!) 140/90   Pulse 80   Ht 5' 3.5" (1.613 m)   Wt 149 lb (67.6 kg)   SpO2 98%   BMI 25.98 kg/m   Physical Exam Vitals and nursing note reviewed.  Constitutional:      General: She is not in acute distress.    Appearance: Normal appearance. She is not ill-appearing or toxic-appearing.  HENT:     Head: Normocephalic and atraumatic.     Right Ear: Hearing, tympanic membrane, ear canal and external ear normal.     Left Ear: Hearing, tympanic membrane, ear canal and external ear normal.     Mouth/Throat:     Pharynx: Oropharynx is clear.  Eyes:     Extraocular Movements: Extraocular movements intact.     Pupils: Pupils are equal, round, and reactive to light.  Neck:     Thyroid: No thyroid mass, thyromegaly or thyroid tenderness.     Vascular: No carotid bruit.  Cardiovascular:     Rate and Rhythm: Normal rate and regular rhythm. No extrasystoles are present.    Pulses:          Dorsalis pedis pulses are 1+ on the right side and 1+ on the left side.     Heart sounds: Normal heart sounds. No murmur heard.    No friction rub. No gallop.  Pulmonary:     Effort: Pulmonary effort is normal.     Breath sounds: Normal breath sounds. No decreased breath sounds, wheezing, rhonchi or rales.  Chest:     Chest wall: No mass.  Abdominal:     Palpations: Abdomen is soft. There is no hepatomegaly, splenomegaly or mass.     Tenderness: There is no abdominal tenderness.     Hernia: No hernia is present.  Musculoskeletal:     Cervical back: Normal range of motion.     Right lower leg: No  edema.     Left lower leg: No edema.  Lymphadenopathy:     Cervical: No cervical adenopathy.     Upper Body:     Right upper body: No supraclavicular adenopathy.     Left upper body: No supraclavicular adenopathy.  Skin:    General: Skin is warm and dry.  Neurological:     General: No focal deficit present.     Mental Status: She is alert and oriented to person, place, and time. Mental status is at baseline.     Sensory: Sensation is intact.     Motor: Motor function is intact. No weakness.     Deep Tendon Reflexes: Reflexes are normal and symmetric.  Psychiatric:        Attention and Perception: Attention normal.        Mood and Affect: Mood normal.        Speech: Speech normal.        Behavior: Behavior normal.        Thought Content: Thought content normal.  Cognition and Memory: Cognition normal.        Judgment: Judgment normal.   Most recent fall risk assessment:    06/25/2023    9:59 AM  Fall Risk   Falls in the past year? 0  Number falls in past yr: 0  Injury with Fall? 0  Risk for fall due to : No Fall Risks  Follow up Falls prevention discussed;Education provided;Falls evaluation completed    Most recent depression screenings:    06/25/2023    9:59 AM 06/21/2022    9:58 AM  PHQ 2/9 Scores  PHQ - 2 Score 0 0   Results:  Studies obtained and personally reviewed by me:  Colonoscopy 05/30/18 was normal.   PAP Smear on 01/18/15 was normal.  Mammogram on 10/24/22 with stable to slight decrease in size of probably benign masses in the right breast; No mammographic evidence of malignancy in the left breast, and a repeat recommendation of 2025.    Bone Density on 06/25/12 with Bone Density: T-score -1.8, indicates Osteopenia.   Labs:     Component Value Date/Time   NA 140 06/22/2023 0911   K 4.7 06/22/2023 0911   CL 105 06/22/2023 0911   CO2 28 06/22/2023 0911   GLUCOSE 90 06/22/2023 0911   BUN 12 06/22/2023 0911   CREATININE 0.79 06/22/2023 0911    CALCIUM 9.4 06/22/2023 0911   PROT 7.0 06/22/2023 0911   ALBUMIN 4.0 12/16/2015 1101   AST 26 06/22/2023 0911   ALT 17 06/22/2023 0911   ALKPHOS 73 12/16/2015 1101   BILITOT 0.7 06/22/2023 0911   GFRNONAA 87 03/18/2019 1207   GFRAA 101 03/18/2019 1207    Lab Results  Component Value Date   WBC 2.8 (L) 06/22/2023   HGB 13.9 06/22/2023   HCT 43.8 06/22/2023   MCV 94.6 06/22/2023   PLT 310 06/22/2023   Lab Results  Component Value Date   CHOL 177 06/22/2023   HDL 97 06/22/2023   LDLCALC 67 06/22/2023   TRIG 53 06/22/2023   CHOLHDL 1.8 06/22/2023   Lab Results  Component Value Date   TSH 1.25 06/22/2023     Assessment & Plan:  Labs ordered today: POCT URINALYSIS DIP (CLINITEK)  Irritable Bowel Syndrome: patient reports symptoms (diarrhea) occurring after exposure to lactose, despite taking a Lactaid. Sending in 20 mg Dicyclomine - take 1 tablet (20 mg total) by mouth 3 (three) times daily as needed prior to exposure to known food triggers.   Colonoscopy 05/30/18 was normal with repeat recommendation of 2029. She reports that she has an upcoming colonoscopy next week.   PAP Smear on 01/18/15 was normal. Overdue since 2019.    Mammogram on 10/24/22 with stable to slight decrease in size of probably benign masses in the right breast; No mammographic evidence of malignancy in the left breast, and a repeat recommendation of 2025.    Bone Density on 06/25/12 with Bone Density: T-score -1.8, indicates Osteopenia, treated with Fosamax 70 mg per Dr. Tera Helper. No repeat recommendation.       Vaccine Counseling: Due for PNA, Tdap, and Covid-19; UTD on Shingles, Flu. Patient expresses surety that she is UTD with PNA and Covid-19 through her [facility] pharmacy - will contact to confirm. She does plan on updating her Tdap soon, but she will be travelling and has decided to postpone it this visit.     Annual wellness visit done today including the all of the following: Reviewed  patient's Family Medical History Reviewed  and updated list of patient's medical providers Assessment of cognitive impairment was done Assessed patient's functional ability Established a written schedule for health screening services Health Risk Assessent Completed and Reviewed  Discussed health benefits of physical activity, and encouraged her to engage in regular exercise appropriate for her age and condition.        I,Emily Lagle,acting as a Neurosurgeon for Margaree Mackintosh, MD.,have documented all relevant documentation on the behalf of Margaree Mackintosh, MD,as directed by  Margaree Mackintosh, MD while in the presence of Margaree Mackintosh, MD.   ***

## 2023-06-26 ENCOUNTER — Ambulatory Visit: Payer: BC Managed Care – PPO

## 2023-06-26 VITALS — BP 156/84 | HR 68 | Temp 97.7°F | Resp 16 | Ht 64.0 in | Wt 151.0 lb

## 2023-06-26 DIAGNOSIS — M810 Age-related osteoporosis without current pathological fracture: Secondary | ICD-10-CM

## 2023-06-26 MED ORDER — DENOSUMAB 60 MG/ML ~~LOC~~ SOSY
60.0000 mg | PREFILLED_SYRINGE | Freq: Once | SUBCUTANEOUS | Status: AC
Start: 2023-06-26 — End: 2023-06-26
  Administered 2023-06-26: 60 mg via SUBCUTANEOUS
  Filled 2023-06-26: qty 1

## 2023-06-26 NOTE — Progress Notes (Signed)
Diagnosis: Osteoporosis  Provider:  Chilton Greathouse MD  Procedure: Injection  Prolia (Denosumab), Dose: 60 mg, Site: subcutaneous, Number of injections: 1  Injection Site(s): Right arm  Post Care: Patient declined observation  Discharge: Condition: Good, Destination: Home . AVS Provided  Performed by:  Loney Hering, LPN

## 2023-06-27 ENCOUNTER — Encounter: Payer: Self-pay | Admitting: Internal Medicine

## 2023-06-27 NOTE — Patient Instructions (Signed)
It was a pleasure to see you today. Vaccines discussed. Please check with pharmacy. Please continue Prolia. Suggest bone density follow up soon. Return in one year or as needed.

## 2023-07-03 DIAGNOSIS — K5289 Other specified noninfective gastroenteritis and colitis: Secondary | ICD-10-CM | POA: Diagnosis not present

## 2023-07-03 DIAGNOSIS — Z800 Family history of malignant neoplasm of digestive organs: Secondary | ICD-10-CM | POA: Diagnosis not present

## 2023-07-03 DIAGNOSIS — Z1211 Encounter for screening for malignant neoplasm of colon: Secondary | ICD-10-CM | POA: Diagnosis not present

## 2023-07-03 LAB — HM COLONOSCOPY

## 2023-07-17 ENCOUNTER — Ambulatory Visit (INDEPENDENT_AMBULATORY_CARE_PROVIDER_SITE_OTHER): Payer: BC Managed Care – PPO | Admitting: Podiatry

## 2023-07-17 ENCOUNTER — Ambulatory Visit (INDEPENDENT_AMBULATORY_CARE_PROVIDER_SITE_OTHER): Payer: BC Managed Care – PPO

## 2023-07-17 ENCOUNTER — Encounter: Payer: Self-pay | Admitting: Podiatry

## 2023-07-17 DIAGNOSIS — M19072 Primary osteoarthritis, left ankle and foot: Secondary | ICD-10-CM | POA: Diagnosis not present

## 2023-07-17 NOTE — Progress Notes (Signed)
Subjective:  Patient ID: [name], female    DOB: 03/06/1956,  MRN: [id number]  Left midfoot arthritis MRI review  68 y.o. female presents with the above complaint. History confirmed with patient.  She returns for follow-up today.  Pain is still quite limiting for her.  She has used the lidocaine patches with some relief.  She does have concern that she may begin to develop adhesive reaction to this has not noticed any rash yet but has previously had this with other transdermal medications.  If she does not use the patches that is very limiting.  Objective:  Physical Exam: warm, good capillary refill, no trophic changes or ulcerative lesions, normal DP and PT pulses, normal sensory exam, and pain tenderness and palpable dorsal spurring over the second and third tarsometatarsal joints.  She has hallux valgus deformity with previously well-healed surgical scars, no pain over the lesser MTPs directly but piano key maneuver of the distal metatarsal produces pain in the midfoot mostly around the second and third but some in the first TMT.  Today there is crepitance and positive grind test with range of motion of the hallux and pain in the MTPJ that is quite significant.  Radiographs: Multiple views x-ray of the left foot: New films taken today compared to previous films show worsened hallux valgus deformity joint space narrowing of the first MTP joint as well as the first second and third TMT    Study Result  Narrative & Impression  CLINICAL DATA:  Dorsal midfoot pain around the talonavicular naviculocuneiform joints. Remote history of prior toe surgery.   EXAM: MRI OF THE LEFT FOOT WITHOUT CONTRAST   TECHNIQUE: Multiplanar, multisequence MR imaging of the left ankle was performed. No intravenous contrast was administered.   COMPARISON:  Left foot x-rays dated November 10, 2022.   FINDINGS: TENDONS   Peroneal: Peroneal longus tendon intact. Peroneal brevis intact.   Posteromedial:  Posterior tibial tendon intact. Flexor digitorum longus tendon intact. Flexor hallucis longus tendon intact.   Anterior: Tibialis anterior tendon intact. Extensor hallucis longus tendon intact. Extensor digitorum longus tendon intact.   Achilles:  Intact.   Plantar Fascia: Intact.   LIGAMENTS   Lateral: Anterior talofibular ligament intact. Calcaneofibular ligament intact. Posterior talofibular ligament intact. Anterior and posterior tibiofibular ligaments intact.   Medial: Deltoid ligament intact. Spring ligament intact.   CARTILAGE   Ankle Joint: No joint effusion. Normal ankle mortise. No chondral defect. Tiny focus of degenerative subchondral marrow edema in the medial talar dome.   Subtalar Joints/Sinus Tarsi: Normal subtalar joints. No subtalar joint effusion. Normal sinus tarsi.   Bones: No suspicious marrow signal abnormality. No fracture or dislocation. Mild navicular-medial cuneiform joint osteoarthritis. Moderate to severe second TMT joint osteoarthritis. Mild-to-moderate third TMT joint osteoarthritis.   Soft Tissue: No soft tissue mass or fluid collection.   IMPRESSION: 1. 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     Electronically Signed   By: Obie Dredge M.D.   On: 12/19/2022 11:19   Assessment:   1. Primary osteoarthritis of left foot      Plan:  Patient was evaluated and treated and all questions answered.  We again reviewed her progress in today's radiographs.  Her arthritis of the midfoot is end-stage.  I discussed with her again fusion of the first second and third tarsometatarsal joints.  She does have still significant hallux valgus deformity with arthritic changes that are painful as well.  I discussed with her due to  the recovery time needed for the midfoot fusion I would recommend arthrodesis of the first MTP joint to correct both the arthritis as well as the remaining hallux valgus formerly as well.  Do not  expect this will at any increased risk or recovery time compared to the midfoot.  I did discuss with her that bone grafting from the heel as well as likely cadaveric bone and bone marrow aspirate will be necessary as well.  We discussed the period of time of nonweightbearing which I expect will be at least 4 to 6 weeks with gradual progressive heel weightbearing to full weightbearing over that time.  Again discussed the risk benefits and mentioned applications including but not limited to  pain, swelling, infection, scar, numbness which may be temporary or permanent, chronic pain, stiffness, nerve pain or damage, wound healing problems, bone healing problems including delayed or non-union.  She has adequate treatment of her bone density now that she is back on Prolia.  She has results of her bone density scan from December which is in the osteopenia territory not osteoporotic.  She would like to consider surgery for the summer.  All questions addressed.  Informed consent signed and reviewed.   Surgical plan:  Procedure: -Left foot first second third tarsometatarsal joint fusion with first MTP joint arthrodesis, bone graft from heel and bone marrow from leg  Location: -[facility]  Anesthesia plan: -General With regional block  Postoperative pain plan: - Tylenol 1000 mg every 6 hours, ibuprofen 600 mg every 6 hours, gabapentin 300 mg every 8 hours x5 days, oxycodone 5 mg 1-2 tabs every 6 hours only as needed  DVT prophylaxis: -ASA 325 mg twice daily  WB Restrictions / DME needs: -NWB in splint postop    No follow-ups on file.

## 2023-07-26 ENCOUNTER — Other Ambulatory Visit: Payer: Self-pay | Admitting: Internal Medicine

## 2023-08-01 DIAGNOSIS — Z6824 Body mass index (BMI) 24.0-24.9, adult: Secondary | ICD-10-CM | POA: Diagnosis not present

## 2023-08-01 DIAGNOSIS — Z01419 Encounter for gynecological examination (general) (routine) without abnormal findings: Secondary | ICD-10-CM | POA: Diagnosis not present

## 2023-09-10 ENCOUNTER — Encounter: Payer: Self-pay | Admitting: Obstetrics & Gynecology

## 2023-09-10 ENCOUNTER — Other Ambulatory Visit: Payer: Self-pay | Admitting: Obstetrics & Gynecology

## 2023-09-10 DIAGNOSIS — N63 Unspecified lump in unspecified breast: Secondary | ICD-10-CM

## 2023-09-18 ENCOUNTER — Telehealth: Payer: Self-pay | Admitting: Orthopedic Surgery

## 2023-09-18 NOTE — Telephone Encounter (Signed)
 Received call from patient. She is seeking Op Note from 2008. I retrieved from [facility]. Auth is attached and ready for patient to pickup.

## 2023-09-24 DIAGNOSIS — S92352A Displaced fracture of fifth metatarsal bone, left foot, initial encounter for closed fracture: Secondary | ICD-10-CM | POA: Diagnosis not present

## 2023-09-24 DIAGNOSIS — M2012 Hallux valgus (acquired), left foot: Secondary | ICD-10-CM | POA: Diagnosis not present

## 2023-09-24 DIAGNOSIS — M79672 Pain in left foot: Secondary | ICD-10-CM | POA: Diagnosis not present

## 2023-09-24 DIAGNOSIS — M19072 Primary osteoarthritis, left ankle and foot: Secondary | ICD-10-CM | POA: Diagnosis not present

## 2023-09-24 DIAGNOSIS — M2142 Flat foot [pes planus] (acquired), left foot: Secondary | ICD-10-CM | POA: Diagnosis not present

## 2023-09-26 ENCOUNTER — Other Ambulatory Visit: Payer: Self-pay | Admitting: Family

## 2023-09-26 ENCOUNTER — Telehealth: Payer: Self-pay | Admitting: Podiatry

## 2023-09-26 ENCOUNTER — Encounter: Payer: Self-pay | Admitting: Internal Medicine

## 2023-09-26 MED ORDER — CIPROFLOXACIN HCL 250 MG PO TABS
250.0000 mg | ORAL_TABLET | Freq: Two times a day (BID) | ORAL | 0 refills | Status: AC
Start: 2023-09-26 — End: 2023-10-06

## 2023-09-26 NOTE — Telephone Encounter (Signed)
 Pt left message with [place] stating she was wanting to cxl surgery.  Left message for pt to call to discuss canceling the surgery.

## 2023-09-26 NOTE — Telephone Encounter (Signed)
 Pt called and is wanting to cancel surgery for now and will call when she is ready to r/s

## 2023-10-05 ENCOUNTER — Other Ambulatory Visit: Payer: Self-pay | Admitting: Family

## 2023-10-20 ENCOUNTER — Other Ambulatory Visit: Payer: Self-pay | Admitting: Internal Medicine

## 2023-10-25 ENCOUNTER — Ambulatory Visit
Admission: RE | Admit: 2023-10-25 | Discharge: 2023-10-25 | Disposition: A | Discharge status: Home / Self Care | Source: Ambulatory Visit | Attending: Obstetrics & Gynecology | Admitting: Obstetrics & Gynecology

## 2023-10-25 DIAGNOSIS — N6315 Unspecified lump in the right breast, overlapping quadrants: Secondary | ICD-10-CM | POA: Diagnosis not present

## 2023-10-25 DIAGNOSIS — N63 Unspecified lump in unspecified breast: Secondary | ICD-10-CM

## 2023-11-28 ENCOUNTER — Encounter: Payer: Self-pay | Admitting: Internal Medicine

## 2023-12-10 ENCOUNTER — Telehealth: Payer: Self-pay

## 2023-12-10 NOTE — Telephone Encounter (Signed)
 Auth Submission: NO AUTH NEEDED - insurance change Site of care: Site of care: [facility] Payer: Medicare A/B with AARP supplement Medication & CPT/J Code(s) submitted: Prolia  (Denosumab ) [id number] Diagnosis Code:  Route of submission (phone, fax, portal):  Phone # Fax # Auth type: Buy/Bill PB Units/visits requested: 60mg  x 2 doses Reference number:  Approval from: 12/10/23 to 07/05/24

## 2023-12-14 ENCOUNTER — Encounter: Payer: Self-pay | Admitting: Internal Medicine

## 2023-12-18 ENCOUNTER — Encounter: Payer: Self-pay | Admitting: Internal Medicine

## 2023-12-20 ENCOUNTER — Encounter: Payer: BC Managed Care – PPO | Admitting: Podiatry

## 2023-12-21 ENCOUNTER — Ambulatory Visit (INDEPENDENT_AMBULATORY_CARE_PROVIDER_SITE_OTHER): Admitting: Internal Medicine

## 2023-12-21 ENCOUNTER — Ambulatory Visit: Payer: Self-pay | Admitting: Internal Medicine

## 2023-12-21 ENCOUNTER — Encounter: Payer: Self-pay | Admitting: Internal Medicine

## 2023-12-21 VITALS — BP 120/80 | HR 69 | Ht 63.5 in | Wt 149.0 lb

## 2023-12-21 DIAGNOSIS — R3915 Urgency of urination: Secondary | ICD-10-CM | POA: Diagnosis not present

## 2023-12-21 DIAGNOSIS — N390 Urinary tract infection, site not specified: Secondary | ICD-10-CM

## 2023-12-21 DIAGNOSIS — R82998 Other abnormal findings in urine: Secondary | ICD-10-CM

## 2023-12-21 LAB — POCT URINALYSIS DIP (CLINITEK)
Bilirubin, UA: NEGATIVE
Glucose, UA: NEGATIVE mg/dL
Ketones, POC UA: NEGATIVE mg/dL
Nitrite, UA: NEGATIVE
Spec Grav, UA: 1.01 (ref 1.010–1.025)
Urobilinogen, UA: 0.2 U/dL
pH, UA: 6.5 (ref 5.0–8.0)

## 2023-12-21 MED ORDER — LIDOCAINE 5 % EX PTCH: 1.0000 | MEDICATED_PATCH | CUTANEOUS | 3 refills | Status: DC

## 2023-12-21 MED ORDER — CIPROFLOXACIN HCL 250 MG PO TABS: 250.0000 mg | ORAL_TABLET | Freq: Two times a day (BID) | ORAL | 1 refills | Status: DC

## 2023-12-21 MED ORDER — ROSUVASTATIN CALCIUM 5 MG PO TABS: 5.0000 mg | ORAL_TABLET | Freq: Every day | ORAL | 3 refills | Status: AC

## 2023-12-21 MED ORDER — CELECOXIB 100 MG PO CAPS: 100.0000 mg | ORAL_CAPSULE | Freq: Two times a day (BID) | ORAL | 3 refills | Status: AC

## 2023-12-21 NOTE — Progress Notes (Addendum)
 Patient Care Team: Perri Ronal PARAS, MD as PCP - General (Internal Medicine) Dolphus Reiter, MD as Consulting Physician (Rheumatology) Melodi Lerner, MD as Consulting Physician (Orthopedic Surgery) Alix Charleston, MD as Consulting Physician (Neurosurgery) Rosalie Kitchens, MD as Consulting Physician (Gastroenterology)  Visit Date: 12/21/23  Subjective:   Chief Complaint  Patient presents with   Chronic UTI   Urinary Frequency   Patient PI:[name],Female DOB:10-25-1955,68 y.o. FMW:[id number]   68 y.o.Female presents today for acute sick visit with Urinary Urgency. Patient has a past medical history of Calcium  Pyrophosphate Deposition Disease (CPPD), Migraine Headaches, Allergic Rhinitis. Says that she started to have a mild persistent headache, which she usually starts to have one prior to an UTI, so she used an at-home UTI test, which was positive for WBCs; was prescribed Levaquin  x3 days, which she finished Saturday, 7/12. Discussed Urology referral.   She says that she plans on having foot surgery in the near future and has been using Lidocaine  patches, but is about to run out of this, so asks about getting a refill.   Mentions that because she is now on Medicare, there have been some changes to where she wants her medications prescribed - would like them sent to [facility] and she needs refills on Crestor  and Celebrex .  Past Medical History:  Diagnosis Date   Allergy    Arthritis    Complication of anesthesia    H/O calcium  pyrophosphate deposition disease (CPPD)    Migraine    PONV (postoperative nausea and vomiting)   No Known Allergies  Family History  Problem Relation Age of Onset   Heart disease Mother    Breast cancer Neg Hx    Social Hx: She is a Futures trader. Husband is a Marine scientist - who has recently retired. Has adult children, a son and a daughter. Non-smoker. Social alcohol consumption.  Review of Systems  Genitourinary:  Positive for urgency.   Neurological:  Positive for headaches.     Objective:  Vitals: BP 120/80   Pulse 69   Ht 5' 3.5 (1.613 m)   Wt 149 lb (67.6 kg)   SpO2 98%   BMI 25.98 kg/m   Physical Exam Vitals and nursing note reviewed.  Constitutional:      General: She is not in acute distress.    Appearance: Normal appearance. She is not toxic-appearing.  HENT:     Head: Normocephalic and atraumatic.  Pulmonary:     Effort: Pulmonary effort is normal.  Skin:    General: Skin is warm and dry.  Neurological:     Mental Status: She is alert and oriented to person, place, and time. Mental status is at baseline.  Psychiatric:        Mood and Affect: Mood normal.        Behavior: Behavior normal.        Thought Content: Thought content normal.        Judgment: Judgment normal.     Results:  Studies Obtained And Personally Reviewed By Me: Labs:     Component Value Date/Time   NA 140 06/22/2023 0911   K 4.7 06/22/2023 0911   CL 105 06/22/2023 0911   CO2 28 06/22/2023 0911   GLUCOSE 90 06/22/2023 0911   BUN 12 06/22/2023 0911   CREATININE 0.79 06/22/2023 0911   CALCIUM  9.4 06/22/2023 0911   PROT 7.0 06/22/2023 0911   ALBUMIN 4.0 12/16/2015 1101   AST 26 06/22/2023 0911   ALT 17 06/22/2023 0911  ALKPHOS 73 12/16/2015 1101   BILITOT 0.7 06/22/2023 0911   GFRNONAA 87 03/18/2019 1207   GFRAA 101 03/18/2019 1207    Lab Results  Component Value Date   WBC 2.8 (L) 06/22/2023   HGB 13.9 06/22/2023   HCT 43.8 06/22/2023   MCV 94.6 06/22/2023   PLT 310 06/22/2023   Lab Results  Component Value Date   CHOL 177 06/22/2023   HDL 97 06/22/2023   LDLCALC 67 06/22/2023   TRIG 53 06/22/2023   CHOLHDL 1.8 06/22/2023   Lab Results  Component Value Date   TSH 1.25 06/22/2023    Results for orders placed or performed in visit on 12/21/23  POCT URINALYSIS DIP (CLINITEK)  Result Value Ref Range   Color, UA yellow yellow   Clarity, UA cloudy (A) clear   Glucose, UA negative negative mg/dL    Bilirubin, UA negative negative   Ketones, POC UA negative negative mg/dL   Spec Grav, UA 8.989 8.989 - 1.025   Blood, UA trace-intact (A) negative   pH, UA 6.5 5.0 - 8.0   POC PROTEIN,UA trace negative, trace   Urobilinogen, UA 0.2 0.2 or 1.0 E.U./dL   Nitrite, UA Negative Negative   Leukocytes, UA Small (1+) (A) Negative   Assessment & Plan:   Meds ordered this encounter  Medications   celecoxib  (CELEBREX ) 100 MG capsule    Sig: Take 1 capsule (100 mg total) by mouth 2 (two) times daily.    Dispense:  180 capsule    Refill:  3    Please send a replace/new response with 90-Day Supply if appropriate to maximize member benefit. Requesting 1 year supply.   lidocaine  (LIDODERM ) 5 %    Sig: Place 1 patch onto the skin daily. Remove & Discard patch within 12 hours or as directed by MD    Dispense:  30 patch    Refill:  3   rosuvastatin  (CRESTOR ) 5 MG tablet    Sig: Take 1 tablet (5 mg total) by mouth daily.    Dispense:  90 tablet    Refill:  3    Please send a replace/new response with 90-Day Supply if appropriate to maximize member benefit. Requesting 1 year supply.   ciprofloxacin  (CIPRO ) 250 MG tablet    Sig: Take 1 tablet (250 mg total) by mouth 2 (two) times daily.    Dispense:  20 tablet    Refill:  1   Urinary Urgency; Leukocytes in Urine: She she used an at-home UTI test, which was positive for WBCs; was prescribed Levaquin  x3 days, which she finished Saturday, 7/12. UA today cloudy, leukocytic, and with trace amounts of blood - sending for culture. Sending in Cipro  500 mg- take 1 tablet (500 mg total) by mouth 2 (two) times daily. Discussed Urology referral as she is concerned about her seemingly frequent UTIs. Referral to Dr. Ronal Leeroy Shank at [facility].  Foot Pain: upcoming foot surgery in the near future and has been using Lidocaine  patches, but is about to run out of this, so asks about getting a refill - refilled.   She is now on Medicare, there have been some  changes to where she wants her medications prescribed - would like them sent to [facility] and she needs refills on Crestor  5 mg daily (which she takes for Hyperlipidemia) and Celebrex  (which she takes for CCPD - Calcium  Pyrophosphate Deposition Disease). Refilled Crestor  and Celebrex .      I,Emily Lagle,acting as a scribe for [employer]  Llesenia Fogal, MD.,have documented all relevant documentation on the behalf of Ronal JINNY Hailstone, MD,as directed by  Ronal JINNY Hailstone, MD while in the presence of Ronal JINNY Hailstone, MD.   I, Ronal JINNY Hailstone, MD, have reviewed all documentation for this visit. The documentation on 12/31/23 for the exam, diagnosis, procedures, and orders are all accurate and complete.IRonal JINNY Hailstone, MD, have reviewed all documentation for this visit. The documentation on 12/31/23 for the exam, diagnosis, procedures, and orders are all accurate and complete.IRonal JINNY Hailstone, MD, have reviewed all documentation for this visit. The documentation on 12/31/23 for the exam, diagnosis, procedures, and orders are all accurate and complete.

## 2023-12-23 LAB — URINE CULTURE
MICRO NUMBER:: 16717430
Result:: NO GROWTH
SPECIMEN QUALITY:: ADEQUATE

## 2023-12-25 ENCOUNTER — Ambulatory Visit: Payer: BC Managed Care – PPO

## 2023-12-25 VITALS — BP 147/87 | HR 78 | Temp 97.9°F | Resp 16 | Ht 64.5 in | Wt 151.2 lb

## 2023-12-25 DIAGNOSIS — M810 Age-related osteoporosis without current pathological fracture: Secondary | ICD-10-CM

## 2023-12-25 MED ORDER — DENOSUMAB 60 MG/ML ~~LOC~~ SOSY
60.0000 mg | PREFILLED_SYRINGE | Freq: Once | SUBCUTANEOUS | Status: AC
Start: 2023-12-25 — End: 2023-12-25
  Administered 2023-12-25: 60 mg via SUBCUTANEOUS
  Filled 2023-12-25: qty 1

## 2023-12-25 NOTE — Progress Notes (Signed)
 Diagnosis: Osteoporosis  Provider:  Mannam, Praveen MD  Procedure: Injection  Prolia  (Denosumab ), Dose: 60 mg, Site: subcutaneous, Number of injections: 1  Injection Site(s): Left arm  Post Care: Patient declined observation  Discharge: Condition: Good, Destination: Home . AVS Declined  Performed by:  Lendel Quant, RN

## 2023-12-31 NOTE — Patient Instructions (Addendum)
 Urine has been cultured. Take Cipro  as directed.for 10 days. Have refilled Celebrex  and Lidoderm  patch as requested.

## 2024-01-03 ENCOUNTER — Encounter: Payer: BC Managed Care – PPO | Admitting: Podiatry

## 2024-01-24 ENCOUNTER — Encounter: Payer: BC Managed Care – PPO | Admitting: Podiatry

## 2024-01-30 ENCOUNTER — Encounter: Payer: Self-pay | Admitting: Internal Medicine

## 2024-01-30 ENCOUNTER — Ambulatory Visit (INDEPENDENT_AMBULATORY_CARE_PROVIDER_SITE_OTHER): Payer: BC Managed Care – PPO | Admitting: Internal Medicine

## 2024-01-30 VITALS — BP 118/70 | HR 84 | Ht 64.5 in | Wt 152.0 lb

## 2024-01-30 DIAGNOSIS — M810 Age-related osteoporosis without current pathological fracture: Secondary | ICD-10-CM

## 2024-01-30 DIAGNOSIS — Z8639 Personal history of other endocrine, nutritional and metabolic disease: Secondary | ICD-10-CM

## 2024-01-30 NOTE — Patient Instructions (Addendum)
 Please stop at the lab.  Continue vitamin D  3000 units.  Please continue Prolia .  Please come back for a follow-up appointment in 1 year.

## 2024-01-30 NOTE — Progress Notes (Addendum)
 Patient ID: [name], female   DOB: 1955/10/26, 68 y.o.   MRN: [id number]   HPI  [name] is a 68 y.o.-year-old female, returning for follow-up for osteoporosis.  Last visit 1 years ago.  Interim history: No falls or fractures since last visit. No dizziness/orthostasis/vision problems. Since last visit, she was able to start Prolia  that she had 2 injections so far, tolerated well. 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  She was getting steroid injections for pain relief >> not in the last  year, in preparation for surgery.  She was diagnosed with osteoporosis in 2019.  Reviewed her previous DXA scan results: 05/22/2023: pending Date L1-L4 T score FN T score 33% distal Radius  05/09/2021 ([facility]) -1.8 RFN: -0.7 LFN: -0.8 N/a  05/09/2019 ([facility]) -2.2 RFN: -1.2 LFN: -0.7 n/a  03/26/2017 ([facility]) -2.8 RFN: -1.1 LFN: -0.8 n/a  06/25/2012 ([facility]) -1.8 LFN: -0.8 n/a   Osteoporosis treatment history: - Fosamax  x4 weeks >> abdominal pain - Prolia  injections- 03/06/2018, 09/17/2018, 03/20/2019, 10/15/2019, 04/20/2020, 06/02/2021 -had coverage problem with her previous insurance - Fosamax  70 mg weekly - restarted 01/2022 >> AP, loose bowels - Prolia  06/2023, 12/2023  She has a history of vitamin D  deficiency -latest levels have been normal: Lab Results  Component Value Date   VD25OH 42.88 01/30/2023   VD25OH 49.30 02/02/2022   VD25OH 51.0 08/12/2020   VD25OH 48.13 08/08/2019   VD25OH 41 03/18/2019   VD25OH 56.60 08/08/2018   VD25OH 54.19 08/07/2017   VD25OH 40 03/06/2017   VD25OH 38 12/16/2015   VD25OH 36 03/25/2012   She is on 3000 units vitamin D  daily.  She continues weightbearing exercises: prev. 30 minutes 3 times a week with a personal trainer >> now only walking and occasional weights.  She is not taking high vitamin A doses.  She has a history of CPPD >> now off Plaquenil; on  Gabapentin  (helps) + Celebrex .  She had many steroid injections over the years.  Menopause was in late 35s.  Pt does have a FH of osteoporosis in mother (many fractures).  She has a distant history of kidney stones x1 and no history of hyper or hypocalcemia or hyperparathyroidism:  Lab Results  Component Value Date   PTH 14 11/14/2019   CALCIUM  9.4 06/22/2023   CALCIUM  9.5 06/20/2022   CALCIUM  9.7 02/02/2022   CALCIUM  9.3 06/16/2021   CALCIUM  9.8 08/12/2020   CALCIUM  9.6 11/14/2019   CALCIUM  9.2 08/08/2019   CALCIUM  9.4 03/18/2019   CALCIUM  9.6 08/08/2018   CALCIUM  10.2 08/07/2017   No thyrotoxicosis: Lab Results  Component Value Date   TSH 1.25 06/22/2023   TSH 1.88 06/20/2022   TSH 1.73 06/16/2021   TSH 1.65 03/18/2019   TSH 0.97 03/06/2017   No CKD. Last BUN/Cr: Lab Results  Component Value Date   BUN 12 06/22/2023   CREATININE 0.79 06/22/2023   She has a h/o cervical spine fusion sx 2005.   ROS: + see HPI  I reviewed pt's medications, allergies, PMH, social hx, family hx, and changes were documented in the history of present illness. Otherwise, unchanged from my initial visit note.  Past Medical History:  Diagnosis Date   Allergy    Arthritis    Complication of anesthesia    H/O calcium  pyrophosphate deposition disease (CPPD)    Migraine    PONV (postoperative nausea  and vomiting)    Past Surgical History:  Procedure Laterality Date   BREAST BIOPSY Right 04/20/2022   MM RT BREAST BX W LOC DEV 1ST LESION IMAGE BX SPEC STEREO GUIDE 04/20/2022 [facility]   BUNIONECTOMY Left    CERVICAL DISCECTOMY     C3-4   EYE SURGERY Bilateral    LASIK   JOINT REPLACEMENT Bilateral    knees   KNEE ARTHROSCOPY Left    KNEE ARTHROSCOPY Right 05/14/2013   Procedure: RIGHT ARTHROSCOPY KNEE;  Surgeon: Dempsey LULLA Moan, MD;  Location: [facility];  Service: Orthopedics;  Laterality: Right;   Social History   Socioeconomic History   Marital status: Married     Spouse name: Not on file   Number of children: Not on file   Years of education: Not on file   Highest education level: Not on file  Occupational History   Not on file  Tobacco Use   Smoking status: Never   Smokeless tobacco: Never  Substance and Sexual Activity   Alcohol use: Yes    Comment: socially   Drug use: No   Sexual activity: Not on file  Other Topics Concern   Not on file  Social History Narrative   Not on file   Social Drivers of Health   Financial Resource Strain: Low Risk  (09/19/2023)   Received from [facility]   Overall Financial Resource Strain (CARDIA)    Difficulty of Paying Living Expenses: Not hard at all  Food Insecurity: No Food Insecurity (09/19/2023)   Received from [facility]   Hunger Vital Sign    Within the past 12 months, you worried that your food would run out before you got the money to buy more.: Never true    Within the past 12 months, the food you bought just didn't last and you didn't have money to get more.: Never true  Transportation Needs: No Transportation Needs (09/19/2023)   Received from [facility] - Transportation    In the past 12 months, has lack of transportation kept you from medical appointments or from getting medications?: No    Lack of Transportation (Non-Medical): No  Physical Activity: Insufficiently Active (05/17/2023)   Exercise Vital Sign    Days of Exercise per Week: 2 days    Minutes of Exercise per Session: 60 min  Stress: No Stress Concern Present (05/17/2023)   [employer] [facility] [employer] - Occupational Stress Questionnaire    Feeling of Stress : Only a little  Social Connections: Unknown (10/18/2021)   Received from [facility]   Social Network    Social Network: Not on file  Intimate Partner Violence: Not At Risk (06/25/2023)   Humiliation, Afraid, Rape, and Kick questionnaire    Fear of Current or Ex-Partner: No    Emotionally  Abused: No    Physically Abused: No    Sexually Abused: No   Current Outpatient Medications on File Prior to Visit  Medication Sig Dispense Refill   alendronate  (FOSAMAX ) 70 MG tablet TAKE 1 TABLET BY MOUTH WEEKLY  WITH 8 OZ OF PLAIN WATER 30  MINUTES BEFORE FIRST FOOD, DRINK OR MEDS. STAY UPRIGHT FOR 30  MINS 12 tablet 3   celecoxib  (CELEBREX ) 100 MG capsule Take 1 capsule (100 mg total) by mouth 2 (two) times daily. 180 capsule 3   Cholecalciferol (VITAMIN D ) 2000 UNITS CAPS Take 2,000 Units by mouth daily.     ciprofloxacin  (CIPRO ) 250  MG tablet Take 1 tablet (250 mg total) by mouth 2 (two) times daily. 20 tablet 1   Coenzyme Q10 (COQ10 PO) Take 1 tablet by mouth daily.     dicyclomine  (BENTYL ) 20 MG tablet Take 1 tablet (20 mg total) by mouth 3 (three) times daily before meals. 60 tablet 1   gabapentin  (NEURONTIN ) 300 MG capsule TAKE 1 CAPSULE BY MOUTH AT  BEDTIME 90 capsule 3   lidocaine  (LIDODERM ) 5 % Place 1 patch onto the skin daily. Remove & Discard patch within 12 hours or as directed by MD 30 patch 3   Multiple Vitamins-Minerals (CENTRUM ULTRA WOMENS) TABS Take by mouth.     rosuvastatin  (CRESTOR ) 5 MG tablet Take 1 tablet (5 mg total) by mouth daily. 90 tablet 3   No current facility-administered medications on file prior to visit.   No Known Allergies Family History  Problem Relation Age of Onset   Heart disease Mother    Breast cancer Neg Hx    PE: BP 118/70   Pulse 84   Ht 5' 4.5 (1.638 m)   Wt 152 lb (68.9 kg)   SpO2 97%   BMI 25.69 kg/m  Wt Readings from Last 3 Encounters:  01/30/24 152 lb (68.9 kg)  12/25/23 151 lb 3.2 oz (68.6 kg)  12/21/23 149 lb (67.6 kg)   Constitutional: normal weight, in NAD Eyes: no exophthalmos ENT: no masses palpated in neck, no cervical lymphadenopathy Cardiovascular: RRR, No MRG Respiratory: CTA B Musculoskeletal: no deformities Skin: no rashes Neurological: no tremor with outstretched hands  Assessment: 1.  Osteoporosis  2.  History of vitamin D  deficiency  Plan: 1. Osteoporosis  -Likely age-related/postmenopausal, but on also, possibly contributed to by her steroid injections.  She also has family history of osteoporosis -Latest bone density results were reviewed: In 05/2019, that showed improvement in the spine BMD (from a T-score of -2.8 to -2.2) and stability in the femoral neck scores.  We discussed that this was a good result and continued Prolia .  She had another bone density scan in 05/2021 which showed further improvement in the spine (T-score improved from -2.2 to -1.8) and also improved T-score at the right femoral neck with a proximal stability at her left femoral neck.  However, afterwards, she had problems with her insurance causing delays in getting Prolia  so I suggested to go back to Fosamax .  She had abdominal pain with this in the past but wanted to try again.  She did start Fosamax  and was tolerating it well, but afterwards, we discussed about switching back to Prolia  and she was able to start this.  She was able to start on 06/26/2023 and the latest injection was on 12/25/2023.  She tolerates this well, without jaw/thigh/hip pain. -She is also trying to maintain a good amount of calcium  and protein in her diet.  She continues exercise.  We discussed about weightbearing exercises as she was mostly walking for exercise and at last visit she was still just walking and doing chair yoga.  I recommended she download weight bearing exercises with elastic bands or dumbbells.  She wanted to try this, however, she did not do so yet.  We again discussed about the importance of strength exercises and I recommended several that she can definitely do sitting down - At last visit vitamin D  level was normal and we will recheck this today -Latest kidney function was from 06/2023 and this was normal, with a GFR of 83.  Her calcium  level was  normal at that time, at 9.4.  We will not repeat these tests  today. - At today's visit, she mentions that she had another bone density scan  at [facility], on the same DXA machine, in 05/2023 - have these records, but she will upload it in MyChart.  If she is unable to do so, will need to ask for the records from the OB/GYN office - I will see her back in a year  2.  History of vitamin D  deficiency - Vitamin D  level was normal at last visit - She continues 3000 units vitamin D  daily - Recheck her vitamin D  level today  Orders Placed This Encounter  Procedures   VITAMIN D  25 Hydroxy (Vit-D Deficiency, Fractures)   Patient sent me the bone density result from 05/2023:   The total femoral neck T-scores appear to be slightly lower than before, possibly due to decreased activity and coming off Prolia , however, I am hoping that this will improve after restarting Prolia .  Overall, the scores are in the osteopenic, not osteoporotic range.  Lela Fendt, MD PhD [facility]

## 2024-01-31 ENCOUNTER — Ambulatory Visit: Payer: Self-pay | Admitting: Internal Medicine

## 2024-01-31 LAB — VITAMIN D 25 HYDROXY (VIT D DEFICIENCY, FRACTURES): Vit D, 25-Hydroxy: 52 ng/mL (ref 30–100)

## 2024-02-12 ENCOUNTER — Encounter: Payer: Self-pay | Admitting: Internal Medicine

## 2024-02-12 ENCOUNTER — Other Ambulatory Visit: Payer: Self-pay

## 2024-02-12 MED ORDER — COVID-19 MRNA VACC (MODERNA) 50 MCG/0.5ML IM SUSP: 0.5000 mL | Freq: Once | INTRAMUSCULAR | 0 refills | Status: AC

## 2024-04-07 ENCOUNTER — Encounter: Payer: Self-pay | Admitting: Radiology

## 2024-04-07 ENCOUNTER — Encounter: Payer: Self-pay | Admitting: Internal Medicine

## 2024-04-08 NOTE — Progress Notes (Addendum)
 Patient Care Team: Perri Ronal PARAS, MD as PCP - General (Internal Medicine) Dolphus Reiter, MD as Consulting Physician (Rheumatology) Melodi Lerner, MD as Consulting Physician (Orthopedic Surgery) Alix Charleston, MD as Consulting Physician (Neurosurgery) Rosalie Kitchens, MD as Consulting Physician (Gastroenterology)  I connected with Aleck ONEIDA Officer on 04/10/24 at  4:00 pm by video enabled telemedicine visit and verified that I am speaking with the correct person using two identifiers.   I discussed the limitations, risks, security and privacy concerns of performing an evaluation and management service by telemedicine and the availability of in-person appointments. I also discussed with the patient that there may be a patient responsible charge related to this service. The patient expressed understanding and agreed to proceed.   Other persons participating in the visit and their role in the encounter: Medical scribe, Nestora JAYSON Bathe  Patient's location: Home  Provider's location: Clinic   I provided 10 minutes of face-to-face video visit time during this encounter, and > 50% was spent counseling as documented under my assessment & plan.   Chief Complaint: Arm Pain   Subjective:    Patient ID: [name] , Female    DOB: [birth year], 68 y.o.    MRN: [id number]   68 y.o. Female presents today for Arm pain . Patient has a past medical history of Migraine headaches, osteoarthritis, Allergic rhinitis.   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 She had not paid attention to it for while, as she hasn't played the bassoon for some time now but last month she was using her arm to push herself up on the stairs and she felt the same pain again. Previously has seen Dr Helene Haddock for a tear of the right supraspinatus tendon. He had previously prescribed her Nitroglycerine patches and she said they improved her pain. Her range  of motion is unaffected and she can sleep fine and she doesn't wake up with pain.    Past Medical History:  Diagnosis Date   Allergy    Arthritis    Complication of anesthesia    H/O calcium  pyrophosphate deposition disease (CPPD)    Migraine    PONV (postoperative nausea and vomiting)      Family History  Problem Relation Age of Onset   Heart disease Mother    Breast cancer Neg Hx   Father died of Aplastic Anemia with history of CVA.  Mother with history of Subependymoma of Fourth Ventricle.  Sister died of Lower Extremity Sarcoma at the age of 15.    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 Husband is a marine scientist. She has adult children, son and a daughter. Non-smoker. Social alcohol consumption.      Review of Systems  Musculoskeletal:  Positive for myalgias.        Objective:   Vitals: There were no vitals taken for this visit.   Physical Exam Vitals and nursing note reviewed.       Results:   Labs:       Component Value Date/Time   NA 140 06/22/2023 0911   K 4.7 06/22/2023 0911   CL 105 06/22/2023 0911   CO2 28 06/22/2023 0911   GLUCOSE 90 06/22/2023 0911   BUN 12 06/22/2023 0911   CREATININE 0.79 06/22/2023 0911   CALCIUM  9.4 06/22/2023 0911   PROT 7.0 06/22/2023 0911   ALBUMIN 4.0 12/16/2015 1101  AST 26 06/22/2023 0911   ALT 17 06/22/2023 0911   ALKPHOS 73 12/16/2015 1101   BILITOT 0.7 06/22/2023 0911   GFRNONAA 87 03/18/2019 1207   GFRAA 101 03/18/2019 1207     Lab Results  Component Value Date   WBC 2.8 (L) 06/22/2023   HGB 13.9 06/22/2023   HCT 43.8 06/22/2023   MCV 94.6 06/22/2023   PLT 310 06/22/2023    Lab Results  Component Value Date   CHOL 177 06/22/2023   HDL 97 06/22/2023   LDLCALC 67 06/22/2023   TRIG 53 06/22/2023   CHOLHDL 1.8 06/22/2023     Lab Results  Component Value Date   TSH 1.25 06/22/2023        Assessment & Plan:   Meds ordered this encounter  Medications    nitroGLYCERIN  (NITRODUR - DOSED IN MG/24 HR) 0.2 mg/hr patch    Sig: Apply one half patch to sore arm every 4 hours as directed    Dispense:  30 patch    Refill:  12    Arm Pain: She said that late last December while she was playing the bassoon she felt a pain in her upper right arm. She had not paid attention to it for while, as she hasn't played the bassoon for some time now but last month she was using her arm to push herself up on the stairs and she felt the same pain again. Previously has seen Dr Helene Haddock for a tear of the right supraspinatus tendon. He had previously prescribed her Nitroglycerine patches and she said they improved her pain. Her range of motion is unaffected and she can sleep fine and she doesn't wake up with pain.    Nitroglycerin  0.2 mg/hr patch apply one half patch every 4 hours to sore arm every 4 hours prescribed.    I,Makayla C Reid,acting as a scribe for Ronal JINNY Hailstone, MD.,have documented all relevant documentation on the behalf of Ronal JINNY Hailstone, MD,as directed by  Ronal JINNY Hailstone, MD while in the presence of Ronal JINNY Hailstone, MD.   I, Ronal JINNY Hailstone, MD, have reviewed all documentation for this visit. The documentation on 04/10/2024 for the exam, diagnosis, procedures, and orders are all accurate and complete.

## 2024-04-10 ENCOUNTER — Telehealth (INDEPENDENT_AMBULATORY_CARE_PROVIDER_SITE_OTHER): Admitting: Internal Medicine

## 2024-04-10 DIAGNOSIS — M79601 Pain in right arm: Secondary | ICD-10-CM

## 2024-04-10 MED ORDER — NITROGLYCERIN 0.2 MG/HR TD PT24: MEDICATED_PATCH | TRANSDERMAL | 12 refills | Status: AC

## 2024-04-14 ENCOUNTER — Encounter: Payer: Self-pay | Admitting: Internal Medicine

## 2024-04-14 NOTE — Patient Instructions (Addendum)
 Patient plays bassoon in a field seismologist. Is experiencing musculoskeletal pain right upper arm. Dr. Ophelia Haddock previously prescribed NTG patch for pain relief which worked well for her. This will be re-prescribed today.

## 2024-06-24 ENCOUNTER — Ambulatory Visit: Payer: Self-pay | Admitting: Internal Medicine

## 2024-06-24 ENCOUNTER — Other Ambulatory Visit: Payer: BC Managed Care – PPO

## 2024-06-24 DIAGNOSIS — Z1329 Encounter for screening for other suspected endocrine disorder: Secondary | ICD-10-CM

## 2024-06-24 DIAGNOSIS — E7800 Pure hypercholesterolemia, unspecified: Secondary | ICD-10-CM

## 2024-06-24 DIAGNOSIS — Z0000 Encounter for general adult medical examination without abnormal findings: Secondary | ICD-10-CM

## 2024-06-24 LAB — CBC WITH DIFFERENTIAL/PLATELET
Absolute Lymphocytes: 1785 {cells}/uL (ref 850–3900)
Absolute Monocytes: 343 {cells}/uL (ref 200–950)
Basophils Absolute: 61 {cells}/uL (ref 0–200)
Basophils Relative: 1.8 %
Eosinophils Absolute: 143 {cells}/uL (ref 15–500)
Eosinophils Relative: 4.2 %
HCT: 43.7 % (ref 35.9–46.0)
Hemoglobin: 14 g/dL (ref 11.7–15.5)
MCH: 30.7 pg (ref 27.0–33.0)
MCHC: 32 g/dL (ref 31.6–35.4)
MCV: 95.8 fL (ref 81.4–101.7)
MPV: 9.3 fL (ref 7.5–12.5)
Monocytes Relative: 10.1 %
Neutro Abs: 1068 {cells}/uL — ABNORMAL LOW (ref 1500–7800)
Neutrophils Relative %: 31.4 %
Platelets: 358 Thousand/uL (ref 140–400)
RBC: 4.56 Million/uL (ref 3.80–5.10)
RDW: 12.4 % (ref 11.0–15.0)
Total Lymphocyte: 52.5 %
WBC: 3.4 Thousand/uL — ABNORMAL LOW (ref 3.8–10.8)

## 2024-06-24 LAB — LIPID PANEL
Cholesterol: 202 mg/dL — ABNORMAL HIGH
HDL: 106 mg/dL
LDL Cholesterol (Calc): 82 mg/dL
Non-HDL Cholesterol (Calc): 96 mg/dL
Total CHOL/HDL Ratio: 1.9 (calc)
Triglycerides: 65 mg/dL

## 2024-06-24 LAB — COMPREHENSIVE METABOLIC PANEL WITH GFR
AG Ratio: 1.7 (calc) (ref 1.0–2.5)
ALT: 18 U/L (ref 6–29)
AST: 25 U/L (ref 10–35)
Albumin: 4.6 g/dL (ref 3.6–5.1)
Alkaline phosphatase (APISO): 66 U/L (ref 37–153)
BUN: 15 mg/dL (ref 7–25)
CO2: 30 mmol/L (ref 20–32)
Calcium: 9.7 mg/dL (ref 8.6–10.4)
Chloride: 105 mmol/L (ref 98–110)
Creat: 0.87 mg/dL (ref 0.50–1.05)
Globulin: 2.7 g/dL (ref 1.9–3.7)
Glucose, Bld: 76 mg/dL (ref 65–99)
Potassium: 4.9 mmol/L (ref 3.5–5.3)
Sodium: 142 mmol/L (ref 135–146)
Total Bilirubin: 0.5 mg/dL (ref 0.2–1.2)
Total Protein: 7.3 g/dL (ref 6.1–8.1)
eGFR: 73 mL/min/1.73m2

## 2024-06-24 LAB — TSH: TSH: 1.7 m[IU]/L (ref 0.40–4.50)

## 2024-06-26 ENCOUNTER — Ambulatory Visit: Payer: BC Managed Care – PPO | Admitting: Internal Medicine

## 2024-06-26 ENCOUNTER — Encounter: Payer: Self-pay | Admitting: Internal Medicine

## 2024-06-26 ENCOUNTER — Ambulatory Visit: Payer: Self-pay | Admitting: Internal Medicine

## 2024-06-26 VITALS — BP 120/70 | HR 80 | Ht 64.5 in | Wt 153.0 lb

## 2024-06-26 DIAGNOSIS — M791 Myalgia, unspecified site: Secondary | ICD-10-CM

## 2024-06-26 DIAGNOSIS — E7800 Pure hypercholesterolemia, unspecified: Secondary | ICD-10-CM

## 2024-06-26 DIAGNOSIS — E559 Vitamin D deficiency, unspecified: Secondary | ICD-10-CM

## 2024-06-26 DIAGNOSIS — M8580 Other specified disorders of bone density and structure, unspecified site: Secondary | ICD-10-CM | POA: Diagnosis not present

## 2024-06-26 DIAGNOSIS — J301 Allergic rhinitis due to pollen: Secondary | ICD-10-CM

## 2024-06-26 DIAGNOSIS — Z8739 Personal history of other diseases of the musculoskeletal system and connective tissue: Secondary | ICD-10-CM

## 2024-06-26 DIAGNOSIS — Z8639 Personal history of other endocrine, nutritional and metabolic disease: Secondary | ICD-10-CM

## 2024-06-26 DIAGNOSIS — Z96653 Presence of artificial knee joint, bilateral: Secondary | ICD-10-CM

## 2024-06-26 DIAGNOSIS — E785 Hyperlipidemia, unspecified: Secondary | ICD-10-CM | POA: Diagnosis not present

## 2024-06-26 DIAGNOSIS — R7989 Other specified abnormal findings of blood chemistry: Secondary | ICD-10-CM

## 2024-06-26 DIAGNOSIS — Z981 Arthrodesis status: Secondary | ICD-10-CM

## 2024-06-26 DIAGNOSIS — Z0001 Encounter for general adult medical examination with abnormal findings: Secondary | ICD-10-CM

## 2024-06-26 DIAGNOSIS — Z0000 Encounter for general adult medical examination without abnormal findings: Secondary | ICD-10-CM

## 2024-06-26 LAB — POCT URINALYSIS DIP (CLINITEK)
Bilirubin, UA: NEGATIVE
Blood, UA: NEGATIVE
Glucose, UA: NEGATIVE mg/dL
Ketones, POC UA: NEGATIVE mg/dL
Leukocytes, UA: NEGATIVE
Nitrite, UA: NEGATIVE
POC PROTEIN,UA: 30 — AB
Spec Grav, UA: 1.01
Urobilinogen, UA: 0.2 U/dL
pH, UA: 6

## 2024-06-26 NOTE — Progress Notes (Addendum)
 "   Welcome to Medicare Physical Exam  Subjective:   [name] is a 69 y.o. female who presents for a Welcome to Medicare Exam.   Visit info / Clinical Intake: Medicare Wellness Visit Type:: Welcome to [employer] (IPPE) Persons participating in visit and providing information:: patient Medicare Wellness Visit Mode:: In-person (required for WTM) Interpreter Needed?: No Pre-visit prep was completed: yes AWV questionnaire completed by patient prior to visit?: yes Date:: 06/23/24 Living arrangements:: lives with spouse/significant other Patient's Overall Health Status Rating: very good Typical amount of pain: some Does pain affect daily life?: no Are you currently prescribed opioids?: no  Dietary Habits and Nutritional Risks How many meals a day?: 3 Eats fruit and vegetables daily?: yes Most meals are obtained by: preparing own meals In the last 2 weeks, have you had any of the following?: none Diabetic:: no  Functional Status Activities of Daily Living (to include ambulation/medication): Independent Ambulation: Independent Medication Administration: Independent Home Management (perform basic housework or laundry): Independent Manage your own finances?: yes Primary transportation is: driving Concerns about vision?: no *vision screening is required for WTM* Concerns about hearing?: no  Fall Screening Falls in the past year?: 0 Number of falls in past year: 0 Was there an injury with Fall?: 0 Fall Risk Category Calculator: 0 Patient Fall Risk Level: Low Fall Risk  Fall Risk Patient at Risk for Falls Due to: No Fall Risks Fall risk Follow up: Falls prevention discussed; Education provided; Falls evaluation completed  Home and Transportation Safety: All rugs have non-skid backing?: yes All stairs or steps have railings?: yes Grab bars in the bathtub or shower?: (!) no Have non-skid surface in bathtub or shower?: (!) no Good home lighting?: yes Regular seat belt  use?: yes Hospital stays in the last year:: no  Cognitive Assessment Difficulty concentrating, remembering, or making decisions? : no Will 6CIT or Mini Cog be Completed: no 6CIT or Mini Cog Declined: patient alert, oriented, able to answer questions appropriately and recall recent events  Reviewed/Updated  Reviewed/Updated: Reviewed All (Medical, Surgical, Family, Medications, Allergies, Care Teams, Patient Goals)    Allergies (verified) Fosamax  [alendronate ]   Current Medications (verified) Outpatient Encounter Medications as of 06/26/2024  Medication Sig   celecoxib  (CELEBREX ) 100 MG capsule Take 1 capsule (100 mg total) by mouth 2 (two) times daily.   Cholecalciferol (VITAMIN D ) 2000 UNITS CAPS Take 2,000 Units by mouth daily.   Coenzyme Q10 (COQ10 PO) Take 1 tablet by mouth daily.   diclofenac Sodium (VOLTAREN) 1 % GEL Apply 2 g topically.   Multiple Vitamins-Minerals (CENTRUM ULTRA WOMENS) TABS Take by mouth.   nitroGLYCERIN  (NITRODUR - DOSED IN MG/24 HR) 0.2 mg/hr patch Apply one half patch to sore arm every 4 hours as directed   rosuvastatin  (CRESTOR ) 5 MG tablet Take 1 tablet (5 mg total) by mouth daily.   [DISCONTINUED] lidocaine  (LIDODERM ) 5 % Place 1 patch onto the skin daily. Remove & Discard patch within 12 hours or as directed by MD   No facility-administered encounter medications on file as of 06/26/2024.    History: Past Medical History:  Diagnosis Date   Allergy    Arthritis    Complication of anesthesia    H/O calcium  pyrophosphate deposition disease (CPPD)    Migraine    PONV (postoperative nausea and vomiting)    Past Surgical History:  Procedure Laterality Date   BREAST BIOPSY Right 04/20/2022   MM RT BREAST BX W LOC DEV 1ST LESION IMAGE BX SPEC  STEREO GUIDE 04/20/2022 [facility]   BUNIONECTOMY Left    CERVICAL DISCECTOMY     C3-4   EYE SURGERY Bilateral    LASIK   JOINT REPLACEMENT Bilateral    knees   KNEE ARTHROSCOPY Left    KNEE  ARTHROSCOPY Right 05/14/2013   Procedure: RIGHT ARTHROSCOPY KNEE;  Surgeon: Dempsey LULLA Moan, MD;  Location: [facility];  Service: Orthopedics;  Laterality: Right;   Family History  Problem Relation Age of Onset   Heart disease Mother    Cancer Mother    Breast cancer Neg Hx    Social History   Occupational History   Not on file  Tobacco Use   Smoking status: Never    Passive exposure: Never   Smokeless tobacco: Never  Substance and Sexual Activity   Alcohol use: Yes    Alcohol/week: 2.0 standard drinks of alcohol    Types: 2 Standard drinks or equivalent per week    Comment: socially   Drug use: Never   Sexual activity: Yes    Birth control/protection: Post-menopausal   Tobacco Counseling Counseling given: No  SDOH Screenings   Food Insecurity: No Food Insecurity (06/26/2024)  Housing: Low Risk (06/26/2024)  Transportation Needs: No Transportation Needs (06/26/2024)  Utilities: Not At Risk (06/26/2024)  Alcohol Screen: Low Risk (06/26/2024)  Depression (PHQ2-9): Low Risk (06/26/2024)  Financial Resource Strain: Low Risk (06/26/2024)  Physical Activity: Sufficiently Active (06/26/2024)  Recent Concern: Physical Activity - Inactive (06/23/2024)  Social Connections: Socially Integrated (06/26/2024)  Stress: No Stress Concern Present (06/26/2024)  Tobacco Use: Low Risk (06/26/2024)  Health Literacy: Adequate Health Literacy (06/26/2024)   See flowsheets for full screening details  Depression Screen PHQ 2 & 9 Depression Scale- Over the past 2 weeks, how often have you been bothered by any of the following problems? Little interest or pleasure in doing things: 0 Feeling down, depressed, or hopeless (PHQ Adolescent also includes...irritable): 0 PHQ-2 Total Score: 0 Trouble falling or staying asleep, or sleeping too much: 0 Feeling tired or having little energy: 0 Poor appetite or overeating (PHQ Adolescent also includes...weight loss): 0 Feeling bad about yourself - or that you are a  failure or have let yourself or your family down: 0 Trouble concentrating on things, such as reading the newspaper or watching television (PHQ Adolescent also includes...like school work): 0 Moving or speaking so slowly that other people could have noticed. Or the opposite - being so fidgety or restless that you have been moving around a lot more than usual: 0 Thoughts that you would be better off dead, or of hurting yourself in some way: 0 PHQ-9 Total Score: 0 If you checked off any problems, how difficult have these problems made it for you to do your work, take care of things at home, or get along with other people?: Not difficult at all      Goals Addressed               This Visit's Progress     Activity and Exercise Increased (pt-stated)        Evidence-based guidance:  Review current exercise levels.  Assess patient perspective on exercise or activity level, barriers to increasing activity, motivation and readiness for change.  Recommend or set healthy exercise goal based on individual tolerance.  Encourage small steps toward making change in amount of exercise or activity.  Urge reduction of sedentary activities or screen time.  Promote group activities within the community or with family or support person.  Consider referral to rehabiliation therapist for assessment and exercise/activity plan.   Notes:              Objective:    Today's Vitals   06/26/24 1005  BP: 120/70  Pulse: 80  SpO2: 97%  Weight: 153 lb (69.4 kg)  Height: 5' 4.5 (1.638 m)  PainSc: 0-No pain   Body mass index is 25.86 kg/m.   Physical Exam Skin warm and dry. No cervical adenopathy. Chest I clear; Cor:RRR without ectopy, Abdomen is benign, No LE edema, Pelvic deferred to GYN, No focal deficits on brief neuro exam   Hearing/Vision screen Hearing Screening   1000Hz  2000Hz  3000Hz  4000Hz   Right ear Pass 0 Pass Pass  Left ear Pass Pass Pass Pass   Vision Screening   Right eye Left eye  Both eyes  Without correction     With correction 20/20 20/20 20/15   Comments: [facility]  Immunizations and Health Maintenance Health Maintenance  Topic Date Due   COVID-19 Vaccine (6 - Mixed Product risk 2025-26 season) 08/11/2024   Medicare Annual Wellness (AWV)  06/26/2025   Mammogram  10/24/2025   DTaP/Tdap/Td (4 - Td or Tdap) 06/24/2033   Colonoscopy  07/02/2033   Pneumococcal Vaccine: 50+ Years  Completed   Influenza Vaccine  Completed   Bone Density Scan  Completed   Zoster Vaccines- Shingrix  Completed   Meningococcal B Vaccine  Aged Out   Hepatitis C Screening  Discontinued    EKG: normal EKG, normal sinus rhythm, unchanged from previous tracings     Assessment/Plan:  This is a routine wellness examination for [place].  Patient Care Team: Perri Ronal PARAS, MD as PCP - General (Internal Medicine) Dolphus Reiter, MD as Consulting Physician (Rheumatology) Melodi Lerner, MD as Consulting Physician (Orthopedic Surgery) Alix Charleston, MD as Consulting Physician (Neurosurgery) Rosalie Kitchens, MD as Consulting Physician (Gastroenterology)  I have personally reviewed and noted the following in the patients chart:   Medical and social history Use of alcohol, tobacco or illicit drugs  Current medications and supplements including opioid prescriptions. Functional ability and status Nutritional status Physical activity Advanced directives List of other physicians Hospitalizations, surgeries, and ER visits in previous 12 months Vitals Screenings to include cognitive, depression, and falls Referrals and appointments  Orders Placed This Encounter  Procedures   POCT URINALYSIS DIP (CLINITEK)   EKG 12-Lead   In addition, I have reviewed and discussed with patient certain preventive protocols, quality metrics, and best practice recommendations. A written personalized care plan for preventive services as well as general preventive health recommendations  were provided to patient.   Kathlynn Porto, CMA   06/26/2024   I, Ronal PARAS Perri, MD, have reviewed all documentation for this visit. The documentation on 06/26/2024 for the exam, diagnosis, procedures, and orders are all accurate and complete.  "

## 2024-06-26 NOTE — Patient Instructions (Addendum)
 [name],  Thank you for taking the time for your Medicare Wellness Visit. I appreciate your continued commitment to your health goals. Please review the care plan we discussed, and feel free to reach out if I can assist you further.  Please note that Annual Wellness Visits do not include a physical exam. Some assessments may be limited, especially if the visit was conducted virtually. If needed, we may recommend an in-person follow-up with your provider.  Ongoing Care Seeing your primary care provider every 3 to 6 months helps us  monitor your health and provide consistent, personalized care.   Referrals If a referral was made during today's visit and you haven't received any updates within two weeks, please contact the referred provider directly to check on the status.  Recommended Screenings:  Health Maintenance  Topic Date Due   Medicare Annual Wellness Visit  Never done   COVID-19 Vaccine (6 - Mixed Product risk 2025-26 season) 08/11/2024   Breast Cancer Screening  10/24/2025   DTaP/Tdap/Td vaccine (4 - Td or Tdap) 06/24/2033   Colon Cancer Screening  07/02/2033   Pneumococcal Vaccine for age over 48  Completed   Flu Shot  Completed   Osteoporosis screening with Bone Density Scan  Completed   Zoster (Shingles) Vaccine  Completed   Meningitis B Vaccine  Aged Out   Hepatitis C Screening  Discontinued       05/08/2013    1:14 PM  Advanced Directives  Does Patient Have a Medical Advance Directive? Patient has advance directive, copy not in chart   Type of Advance Directive --   Copy of Healthcare Power of Attorney in Chart? Copy requested from family      Data saved with a previous flowsheet row definition    Vision: Annual vision screenings are recommended for early detection of glaucoma, cataracts, and diabetic retinopathy. These exams can also reveal signs of chronic conditions such as diabetes and high blood pressure.  Dental: Annual dental screenings help detect early  signs of oral cancer, gum disease, and other conditions linked to overall health, including heart disease and diabetes.  Please see the attached documents for additional preventive care recommendations.   It was a pleasure to see you today. Please return in one year or as needed. Agree with Prolia  therapy.

## 2024-06-27 ENCOUNTER — Other Ambulatory Visit (HOSPITAL_COMMUNITY): Payer: Self-pay | Admitting: Internal Medicine

## 2024-06-27 ENCOUNTER — Ambulatory Visit

## 2024-06-27 VITALS — BP 122/57 | HR 74 | Temp 97.6°F | Resp 16 | Ht 65.0 in | Wt 154.6 lb

## 2024-06-27 DIAGNOSIS — M810 Age-related osteoporosis without current pathological fracture: Secondary | ICD-10-CM

## 2024-06-27 MED ORDER — DENOSUMAB 60 MG/ML ~~LOC~~ SOSY
60.0000 mg | PREFILLED_SYRINGE | Freq: Once | SUBCUTANEOUS | Status: AC
  Administered 2024-06-27: 60 mg via SUBCUTANEOUS
  Filled 2024-06-27: qty 1

## 2024-06-27 NOTE — Progress Notes (Signed)
 Diagnosis: Osteoporosis  Provider:  Chilton Greathouse MD  Procedure: Injection  Prolia (Denosumab), Dose: 60 mg, Site: subcutaneous, Number of injections: 1  Injection Site(s): Left arm  Post Care: Patient declined observation  Discharge: Condition: Good, Destination: Home . AVS Declined  Performed by:  Loney Hering, LPN

## 2024-06-30 ENCOUNTER — Telehealth (HOSPITAL_COMMUNITY): Payer: Self-pay | Admitting: Pharmacy Technician

## 2024-06-30 NOTE — Telephone Encounter (Incomplete)
 Auth Submission: NO AUTH NEEDED Site of care: [facility] Payer: MEDICARE A/B, SUPP Medication & CPT/J Code(s) submitted: {CHINFMEDLIST:25391} Diagnosis Code: M81.0 Route of submission (phone, fax, portal):  Phone # Fax # Auth type: Buy/Bill HB Units/visits requested: 60mg  x 2 doses, q 6 months Reference number: *** Approval from: *** to ***    Dagoberto Armour, CPhT [facility] Phone: [phone] 06/30/2024

## 2024-07-05 ENCOUNTER — Encounter: Payer: Self-pay | Admitting: Internal Medicine

## 2024-12-25 ENCOUNTER — Ambulatory Visit

## 2025-01-29 ENCOUNTER — Ambulatory Visit: Admitting: Internal Medicine

## 2025-06-29 ENCOUNTER — Other Ambulatory Visit

## 2025-07-02 ENCOUNTER — Ambulatory Visit: Admitting: Internal Medicine
